# Patient Record
Sex: Male | Born: 1950
Health system: Southern US, Community
[De-identification: ages and names within clinical notes are randomized; demographics above are authoritative.]

## PROBLEM LIST (undated history)

## (undated) DIAGNOSIS — I10 Essential (primary) hypertension: Secondary | ICD-10-CM

## (undated) DIAGNOSIS — M19011 Primary osteoarthritis, right shoulder: Secondary | ICD-10-CM

## (undated) DIAGNOSIS — N401 Enlarged prostate with lower urinary tract symptoms: Secondary | ICD-10-CM

## (undated) DIAGNOSIS — T7840XA Allergy, unspecified, initial encounter: Secondary | ICD-10-CM

## (undated) DIAGNOSIS — B029 Zoster without complications: Secondary | ICD-10-CM

## (undated) DIAGNOSIS — M1990 Unspecified osteoarthritis, unspecified site: Secondary | ICD-10-CM

## (undated) DIAGNOSIS — H269 Unspecified cataract: Secondary | ICD-10-CM

## (undated) HISTORY — PX: EYE SURGERY: SHX253

## (undated) HISTORY — DX: Zoster without complications: B02.9

## (undated) HISTORY — PX: JOINT REPLACEMENT: SHX530

## (undated) HISTORY — DX: Allergy, unspecified, initial encounter: T78.40XA

## (undated) HISTORY — PX: VASECTOMY: SHX75

## (undated) HISTORY — DX: Essential (primary) hypertension: I10

## (undated) HISTORY — DX: Unspecified cataract: H26.9

---
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
## 2008-03-28 HISTORY — PX: ROTATOR CUFF REPAIR: SHX139

## 2010-03-28 HISTORY — PX: RETINAL DETACHMENT SURGERY: SHX105

## 2011-03-29 HISTORY — PX: CATARACT EXTRACTION: SUR2

## 2012-02-29 LAB — LIPID PANEL
Cholesterol: 160 mg/dL (ref 0–200)
HDL: 62 mg/dL (ref 35–70)
LDL CALC: 82 mg/dL
Triglycerides: 79 mg/dL (ref 40–160)

## 2012-02-29 LAB — PSA: PSA: 1.5

## 2012-02-29 LAB — HEPATIC FUNCTION PANEL
ALK PHOS: 65 U/L (ref 25–125)
ALT: 52 U/L — AB (ref 10–40)
AST: 42 U/L — AB (ref 14–40)

## 2012-02-29 LAB — BASIC METABOLIC PANEL
Creatinine: 0.9 mg/dL (ref 0.6–1.3)
GLUCOSE: 95 mg/dL
Potassium: 4.6 mmol/L (ref 3.4–5.3)
SODIUM: 141 mmol/L (ref 137–147)

## 2012-02-29 LAB — C-REACTIVE PROTEIN: CRP: 1 mg/dL

## 2012-02-29 LAB — ANA: ANA Direct: NEGATIVE

## 2012-02-29 LAB — TSH: TSH: 1.14 u[IU]/mL (ref 0.41–5.90)

## 2012-02-29 LAB — CYCLIC CITRUL PEPTIDE ANTIBODY, IGG: CCP Antibodies IgG/IgA: NEGATIVE

## 2012-02-29 LAB — RHEUMATOID FACTOR: Rheumatoid Factor Ab (IgM): <7

## 2015-01-05 ENCOUNTER — Ambulatory Visit (INDEPENDENT_AMBULATORY_CARE_PROVIDER_SITE_OTHER): Payer: Medicare Other | Admitting: Osteopathic Medicine

## 2015-01-05 ENCOUNTER — Encounter: Payer: Self-pay | Admitting: Osteopathic Medicine

## 2015-01-05 VITALS — BP 150/85 | HR 63 | Ht 70.0 in | Wt 180.0 lb

## 2015-01-05 DIAGNOSIS — I10 Essential (primary) hypertension: Secondary | ICD-10-CM

## 2015-01-05 DIAGNOSIS — Z139 Encounter for screening, unspecified: Secondary | ICD-10-CM

## 2015-01-05 DIAGNOSIS — R7301 Impaired fasting glucose: Secondary | ICD-10-CM

## 2015-01-05 DIAGNOSIS — J019 Acute sinusitis, unspecified: Secondary | ICD-10-CM | POA: Diagnosis not present

## 2015-01-05 DIAGNOSIS — B9689 Other specified bacterial agents as the cause of diseases classified elsewhere: Secondary | ICD-10-CM

## 2015-01-05 DIAGNOSIS — R030 Elevated blood-pressure reading, without diagnosis of hypertension: Secondary | ICD-10-CM | POA: Insufficient documentation

## 2015-01-05 HISTORY — DX: Essential (primary) hypertension: I10

## 2015-01-05 LAB — CBC WITH DIFFERENTIAL/PLATELET
BASOS PCT: 0 % (ref 0–1)
Basophils Absolute: 0 10*3/uL (ref 0.0–0.1)
Eosinophils Absolute: 0.1 10*3/uL (ref 0.0–0.7)
Eosinophils Relative: 1 % (ref 0–5)
HEMATOCRIT: 40.6 % (ref 39.0–52.0)
HEMOGLOBIN: 14 g/dL (ref 13.0–17.0)
LYMPHS PCT: 24 % (ref 12–46)
Lymphs Abs: 1.5 10*3/uL (ref 0.7–4.0)
MCH: 35.5 pg — AB (ref 26.0–34.0)
MCHC: 34.5 g/dL (ref 30.0–36.0)
MCV: 103 fL — ABNORMAL HIGH (ref 78.0–100.0)
MONO ABS: 0.6 10*3/uL (ref 0.1–1.0)
MONOS PCT: 9 % (ref 3–12)
MPV: 11.8 fL (ref 8.6–12.4)
NEUTROS ABS: 4.2 10*3/uL (ref 1.7–7.7)
NEUTROS PCT: 66 % (ref 43–77)
Platelets: 199 10*3/uL (ref 150–400)
RBC: 3.94 MIL/uL — ABNORMAL LOW (ref 4.22–5.81)
RDW: 13.5 % (ref 11.5–15.5)
WBC: 6.4 10*3/uL (ref 4.0–10.5)

## 2015-01-05 MED ORDER — AMOXICILLIN-POT CLAVULANATE 875-125 MG PO TABS: 1.0000 | ORAL_TABLET | Freq: Two times a day (BID) | ORAL | Status: DC

## 2015-01-05 NOTE — Patient Instructions (Signed)
Bring your blood pressure cuff into the office - nurse visit to make sure your cuff is accurate.

## 2015-01-05 NOTE — Progress Notes (Signed)
HPI: Billy Landry is a 64 y.o. male who presents to [facility]  today for chief complaint of:  Chief Complaint  Patient presents with  . Establish Care  . Hypertension  . sinus drainage    x10 days   Hypertension . Severity: moderate . Duration: Diagnosed approximately 2 weeks ago . Context: Blood pressure greater than 200/100 diagnosed on vital signs check at urgent care . Assoc signs/symptoms: No chest pain, pressure, palpitations, headache, vision change.  Sinus drainage . Location: Sinuses . Quality: Soreness, pressure, drainage . Severity: Mild to moderate . Duration: 10 days . Modifying factors: Tried DayQuil and neck with minimal relief . Assoc signs/symptoms: No fever, chills, cough     Past medical, social and family history reviewed: Past Medical History  Diagnosis Date  . Hypertension   . Allergy    Past Surgical History  Procedure Laterality Date  . Vasectomy    . Retinal detachment surgery  2012  . Tonsilectomy, adenoidectomy, bilateral myringotomy and tubes  1968  . Rotator cuff repair Right 2010  . Cataract extraction Left 2013   Social History  Substance Use Topics  . Smoking status: Never Smoker   . Smokeless tobacco: Not on file  . Alcohol Use: Not on file   Family History  Problem Relation Age of Onset  . Cancer Mother   . Diabetes Father   . Stroke Father   . Stroke Brother     Current Outpatient Prescriptions  Medication Sig Dispense Refill  . amoxicillin-clavulanate (AUGMENTIN) 875-125 MG tablet Take 1 tablet by mouth 2 (two) times daily. 10 tablet 0  . Cholecalciferol (VITAMIN D PO) Take 4,000 Int'l Units by mouth daily.    . cyanocobalamin 2000 MCG tablet Take 2,000 mcg by mouth daily.    Marland Kitchen L-Lysine 1000 MG TABS Take by mouth.    . loratadine (CLARITIN) 10 MG tablet Take 10 mg by mouth daily.    Marland Kitchen losartan (COZAAR) 25 MG tablet Take 25 mg by mouth 2 (two) times daily.    . vitamin E 400 UNIT  capsule Take 400 Units by mouth daily.     No current facility-administered medications for this visit.   Allergies  Allergen Reactions  . Bee Venom Anaphylaxis     Review of Systems: CONSTITUTIONAL: Neg fever/chills, no unintentional weight changes HEAD/EYES/EARS/NOSE/THROAT: No headache/vision change or hearing change, no sore throat, positive sinus pressure and drainage as per history of present illness CARDIAC: No chest pain/pressure/palpitations, no orthopnea RESPIRATORY: No cough/shortness of breath/wheeze GASTROINTESTINAL: No nausea/vomiting/abdominal pain/blood in stool/diarrhea/constipation MUSCULOSKELETAL: No myalgia/arthralgia GENITOURINARY: No incontinence, No abnormal genital bleeding/discharge SKIN: No rash/wounds/concerning lesions HEM/ONC: No easy bruising/bleeding, no abnormal lymph node ENDOCRINE: No polyuria/polydipsia/polyphagia, no heat/cold intolerance  NEUROLOGIC: No weakness/dizzines/slurred speech PSYCHIATRIC: No concerns with depression/anxiety or sleep problems    Exam:  BP 150/85 mmHg  Pulse 63  Ht 5\' 10"  (1.778 m)  Wt 180 lb (81.647 kg)  BMI 25.83 kg/m2 Constitutional: VSS, see above. General Appearance: alert, well-developed, well-nourished, NAD Eyes: Normal lids and conjunctive, non-icteric sclera, PERRLA Ears, Nose, Mouth, Throat: Normal external inspection ears/nares/mouth/lips/gums, Normal TM bilaterally, MMM, posterior pharynx without erythema/exudate Neck: No masses, trachea midline. No thyroid enlargement/tenderness/mass appreciated Respiratory: Normal respiratory effort. no wheeze/rhonchi/rales Cardiovascular: S1/S2 normal, no murmur/rub/gallop auscultated. RRR. No carotid bruit or JVD. No abdominal aortic bruit. Pedal pulse II/IV bilaterally DP and PT. No lower extremity edema. Gastrointestinal: Nontender, no masses. No hepatomegaly, no splenomegaly. No hernia appreciated. Bowel sounds normal. Rectal  exam deferred.  Musculoskeletal: Gait  normal. No clubbing/cyanosis of digits.  Neurological: No cranial nerve deficit on limited exam. Motor and sensation intact and symmetric Psychiatric: Normal judgment/insight. Normal mood and affect. Oriented x3.    No results found for this or any previous visit (from the past 72 hour(s)).    ASSESSMENT/PLAN:  Essential hypertension - Plan: CBC with Differential/Platelet, COMPLETE METABOLIC PANEL WITH GFR, Lipid panel, TSH. Lipid screening and metabolic profile as above, patient advised on annual screening for lipid check and diabetes in any hypertensive patient. Needs follow-up in one week for nurse visit to check his blood pressure cuff against our equipment so we can trust his home measurements, which are occasionally 120s over 80s but also are in 150s over 90s on occasion, patient counseled on proper use of home blood pressure cuff including sitting for 5 minutes, both feet on fluoroscopy, arm resting heart level.. May need to consider increasing losartan person at [facility], await lab work to ensure medication safety, patient advised only to repeat metabolic profile in a few months to ensure stable kidney function. May expect some yet transient increase in creatinine given he was recently started on losartan, if this is the case we'll repeat kidney function sooner.  Screening - Plan: Hepatitis C antibody, HIV antibody  Acute bacterial sinusitis - augmentin Rx given, RTC if no better, Flonase and antihistamin w/o Decongestant recommended, avoid NSAID but Tylenol ok.    Recommended follow-up for Medicare annual wellness visit, will update ministrations at that time, as well as other preventive care.

## 2015-01-06 DIAGNOSIS — R7301 Impaired fasting glucose: Secondary | ICD-10-CM | POA: Diagnosis not present

## 2015-01-06 LAB — COMPLETE METABOLIC PANEL WITH GFR
ALBUMIN: 4.2 g/dL (ref 3.6–5.1)
ALK PHOS: 55 U/L (ref 40–115)
ALT: 30 U/L (ref 9–46)
AST: 27 U/L (ref 10–35)
BILIRUBIN TOTAL: 0.8 mg/dL (ref 0.2–1.2)
BUN: 23 mg/dL (ref 7–25)
CALCIUM: 9.7 mg/dL (ref 8.6–10.3)
CO2: 26 mmol/L (ref 20–31)
Chloride: 105 mmol/L (ref 98–110)
Creat: 0.75 mg/dL (ref 0.70–1.25)
GFR, Est African American: >89 mL/min (ref >=60)
GLUCOSE: 107 mg/dL — AB (ref 65–99)
Potassium: 4.5 mmol/L (ref 3.5–5.3)
SODIUM: 140 mmol/L (ref 135–146)
TOTAL PROTEIN: 6.6 g/dL (ref 6.1–8.1)

## 2015-01-06 LAB — LIPID PANEL
CHOLESTEROL: 164 mg/dL (ref 125–200)
HDL: 43 mg/dL (ref >=40)
LDL Cholesterol: 103 mg/dL (ref <130)
Total CHOL/HDL Ratio: 3.8 Ratio (ref <=5.0)
Triglycerides: 89 mg/dL (ref <150)
VLDL: 18 mg/dL (ref <30)

## 2015-01-06 LAB — HEMOGLOBIN A1C
HEMOGLOBIN A1C: 5.5 % (ref <5.7)
MEAN PLASMA GLUCOSE: 111 mg/dL (ref <117)

## 2015-01-06 LAB — HIV ANTIBODY (ROUTINE TESTING W REFLEX): HIV: NONREACTIVE

## 2015-01-06 LAB — TSH: TSH: 1.745 u[IU]/mL (ref 0.350–4.500)

## 2015-01-06 LAB — HEPATITIS C ANTIBODY: HCV AB: NEGATIVE

## 2015-01-06 NOTE — Addendum Note (Signed)
Addended by: Maryla Morrow on: 01/06/2015 08:13 AM   Modules accepted: Orders

## 2015-01-08 ENCOUNTER — Telehealth: Payer: Self-pay | Admitting: *Deleted

## 2015-01-08 NOTE — Telephone Encounter (Signed)
Pt is requesting a refill on losartan. Confirmed dose and directions are same as what we have in the computer. Have to rout to provider since our office has not yet prescribed this medication

## 2015-01-09 ENCOUNTER — Other Ambulatory Visit: Payer: Self-pay | Admitting: *Deleted

## 2015-01-09 MED ORDER — LOSARTAN POTASSIUM 25 MG PO TABS: 25.0000 mg | ORAL_TABLET | Freq: Two times a day (BID) | ORAL | Status: DC

## 2015-01-12 ENCOUNTER — Ambulatory Visit (INDEPENDENT_AMBULATORY_CARE_PROVIDER_SITE_OTHER): Payer: Medicare Other | Admitting: Osteopathic Medicine

## 2015-01-12 VITALS — BP 123/71 | HR 60

## 2015-01-12 DIAGNOSIS — IMO0001 Reserved for inherently not codable concepts without codable children: Secondary | ICD-10-CM

## 2015-01-12 DIAGNOSIS — I10 Essential (primary) hypertension: Secondary | ICD-10-CM | POA: Diagnosis not present

## 2015-01-12 DIAGNOSIS — R030 Elevated blood-pressure reading, without diagnosis of hypertension: Secondary | ICD-10-CM | POA: Diagnosis not present

## 2015-01-12 NOTE — Progress Notes (Signed)
   Subjective:    Patient ID: Billy Landry, male    DOB: 1950-11-12, 64 y.o.   MRN: [id number]  HPI  Caswell is here for a blood pressure check. He reports taking Losartan 25 mg bid. Check blood pressure with home monitor 123/75 and then with clinic monitor 123/71. Home blood pressure monitor is reading almost the same as clinic. Denies chest pain, shortness of breath or headaches.   Review of Systems     Objective:   Physical Exam        Assessment & Plan:  Elevated blood pressure - needs a refill on Losartan.

## 2015-01-13 MED ORDER — LOSARTAN POTASSIUM 50 MG PO TABS: 50.0000 mg | ORAL_TABLET | Freq: Every day | ORAL | Status: DC

## 2015-01-22 ENCOUNTER — Other Ambulatory Visit: Payer: Self-pay | Admitting: Osteopathic Medicine

## 2015-01-29 ENCOUNTER — Encounter: Payer: Self-pay | Admitting: Osteopathic Medicine

## 2015-03-31 DIAGNOSIS — H26492 Other secondary cataract, left eye: Secondary | ICD-10-CM | POA: Diagnosis not present

## 2015-03-31 DIAGNOSIS — H2511 Age-related nuclear cataract, right eye: Secondary | ICD-10-CM | POA: Diagnosis not present

## 2015-05-19 DIAGNOSIS — H02834 Dermatochalasis of left upper eyelid: Secondary | ICD-10-CM | POA: Diagnosis not present

## 2015-05-19 DIAGNOSIS — H26493 Other secondary cataract, bilateral: Secondary | ICD-10-CM | POA: Diagnosis not present

## 2015-05-19 DIAGNOSIS — H527 Unspecified disorder of refraction: Secondary | ICD-10-CM | POA: Diagnosis not present

## 2015-05-19 DIAGNOSIS — H33012 Retinal detachment with single break, left eye: Secondary | ICD-10-CM | POA: Diagnosis not present

## 2015-05-19 DIAGNOSIS — H25811 Combined forms of age-related cataract, right eye: Secondary | ICD-10-CM | POA: Diagnosis not present

## 2015-05-19 DIAGNOSIS — H35372 Puckering of macula, left eye: Secondary | ICD-10-CM | POA: Diagnosis not present

## 2015-05-19 DIAGNOSIS — H02831 Dermatochalasis of right upper eyelid: Secondary | ICD-10-CM | POA: Diagnosis not present

## 2015-05-19 DIAGNOSIS — H31012 Macula scars of posterior pole (postinflammatory) (post-traumatic), left eye: Secondary | ICD-10-CM | POA: Diagnosis not present

## 2015-07-16 ENCOUNTER — Other Ambulatory Visit: Payer: Self-pay

## 2015-07-16 ENCOUNTER — Other Ambulatory Visit: Payer: Self-pay | Admitting: Osteopathic Medicine

## 2015-07-16 DIAGNOSIS — I10 Essential (primary) hypertension: Secondary | ICD-10-CM

## 2015-07-16 MED ORDER — LOSARTAN POTASSIUM 50 MG PO TABS: ORAL_TABLET | ORAL | Status: DC

## 2015-08-15 ENCOUNTER — Other Ambulatory Visit: Payer: Self-pay | Admitting: Osteopathic Medicine

## 2015-08-17 ENCOUNTER — Other Ambulatory Visit: Payer: Self-pay

## 2015-08-17 MED ORDER — LOSARTAN POTASSIUM 50 MG PO TABS: 50.0000 mg | ORAL_TABLET | Freq: Every day | ORAL | Status: DC

## 2015-08-17 MED ORDER — LOSARTAN POTASSIUM 25 MG PO TABS: 25.0000 mg | ORAL_TABLET | Freq: Two times a day (BID) | ORAL | Status: DC

## 2015-08-17 NOTE — Telephone Encounter (Signed)
Patient requets refill for Losartan 50 mg. #30 0 refills sent to pharmacy. Patient advised appointment is needed for further refills. Darrek Leasure,CMA

## 2015-09-16 ENCOUNTER — Other Ambulatory Visit: Payer: Self-pay | Admitting: Osteopathic Medicine

## 2015-10-06 DIAGNOSIS — H2511 Age-related nuclear cataract, right eye: Secondary | ICD-10-CM | POA: Diagnosis not present

## 2015-10-06 DIAGNOSIS — H02834 Dermatochalasis of left upper eyelid: Secondary | ICD-10-CM | POA: Diagnosis not present

## 2015-10-06 DIAGNOSIS — H02831 Dermatochalasis of right upper eyelid: Secondary | ICD-10-CM | POA: Diagnosis not present

## 2015-10-06 DIAGNOSIS — H35372 Puckering of macula, left eye: Secondary | ICD-10-CM | POA: Diagnosis not present

## 2015-10-17 ENCOUNTER — Other Ambulatory Visit: Payer: Self-pay | Admitting: Osteopathic Medicine

## 2015-10-26 ENCOUNTER — Ambulatory Visit (INDEPENDENT_AMBULATORY_CARE_PROVIDER_SITE_OTHER): Payer: Medicare Other | Admitting: Osteopathic Medicine

## 2015-10-26 ENCOUNTER — Encounter: Payer: Self-pay | Admitting: Osteopathic Medicine

## 2015-10-26 VITALS — BP 163/94 | HR 75 | Ht 70.0 in | Wt 182.0 lb

## 2015-10-26 DIAGNOSIS — Z85828 Personal history of other malignant neoplasm of skin: Secondary | ICD-10-CM

## 2015-10-26 DIAGNOSIS — R030 Elevated blood-pressure reading, without diagnosis of hypertension: Secondary | ICD-10-CM

## 2015-10-26 DIAGNOSIS — Z0000 Encounter for general adult medical examination without abnormal findings: Secondary | ICD-10-CM | POA: Diagnosis not present

## 2015-10-26 DIAGNOSIS — I10 Essential (primary) hypertension: Secondary | ICD-10-CM

## 2015-10-26 DIAGNOSIS — IMO0001 Reserved for inherently not codable concepts without codable children: Secondary | ICD-10-CM

## 2015-10-26 DIAGNOSIS — Z23 Encounter for immunization: Secondary | ICD-10-CM

## 2015-10-26 NOTE — Patient Instructions (Signed)
Please let us know if you don't hear back about dermatology referral by the end of this week.  Please plan on getting routine blood work done in October 2017 (sometime around/by Halloween).   If you have any questions about routine recommended vaccinations that we discussed today, flu vaccine, shingles vaccine, pneumonia vaccines, please let me know.   Anything else we can do for you, please don't hesitate to contact us!

## 2015-10-26 NOTE — Progress Notes (Signed)
HPI: Billy Landry is a 65 y.o. Not Hispanic or Latino male  who presents to [facility] today, 10/26/15,  for chief complaint of:  Chief Complaint  Patient presents with  . Annual Exam    MEDICATION REFILL     Preventive care reviewed as below, patient has no complaints today. He brings home blood pressure readings with him to the visit.  Hypertension: Patient was here in October 2016, blood pressure initially elevated to follow-up for nurse visit once started on losartan 25 mg 3 times a day showed some improvement. Home blood pressure monitor reading the same as clinic. He should brings home reading to visits today, blood pressure average systolic [id number], diastolic [id number].    Past medical, surgical, social and family history reviewed: Past Medical History:  Diagnosis Date  . Allergy   . Essential hypertension 01/05/2015  . Hypertension    Past Surgical History:  Procedure Laterality Date  . CATARACT EXTRACTION Left 2013  . RETINAL DETACHMENT SURGERY  2012  . ROTATOR CUFF REPAIR Right 2010  . TONSILECTOMY, ADENOIDECTOMY, BILATERAL MYRINGOTOMY AND TUBES  1968  . VASECTOMY     Social History  Substance Use Topics  . Smoking status: Never Smoker  . Smokeless tobacco: Not on file  . Alcohol use 0.0 oz/week   Family History  Problem Relation Age of Onset  . Cancer Mother   . Diabetes Father   . Stroke Father   . Stroke Brother      Current medication list and allergy/intolerance information reviewed:   Current Outpatient Prescriptions  Medication Sig Dispense Refill  . aspirin EC 81 MG tablet Take 81 mg by mouth daily.    . Cholecalciferol (VITAMIN D PO) Take 4,000 Int'l Units by mouth daily.    . cyanocobalamin 2000 MCG tablet Take 2,000 mcg by mouth daily.    Marland Kitchen L-Lysine 1000 MG TABS Take by mouth.    . loratadine (CLARITIN) 10 MG tablet Take 10 mg by mouth daily.    Marland Kitchen losartan (COZAAR) 25 MG tablet Take 1 tablet (25 mg total) by mouth  2 (two) times daily. 30 tablet 0  . losartan (COZAAR) 50 MG tablet TAKE 1 TABLET BY MOUTH EVERY DAY, APPT NEEDED FOR FURTHER REFILLS 15 tablet 0  . Melatonin 5 MG TABS Take by mouth.    . vitamin E 400 UNIT capsule Take 400 Units by mouth daily.     No current facility-administered medications for this visit.    Allergies  Allergen Reactions  . Bee Venom Anaphylaxis      Review of Systems:  Constitutional:  No  fever, no chills, No recent illness, No unintentional weight changes. No significant fatigue.   HEENT: No  headache, no vision change, no hearing change, No sore throat, No  sinus pressure  Cardiac: No  chest pain, No  pressure, No palpitations, No  Orthopnea  Respiratory:  No  shortness of breath. No  Cough  Gastrointestinal: No  abdominal pain, No  nausea, No  vomiting,  No  blood in stool, No  diarrhea, No  constipation   Musculoskeletal: No new myalgia/arthralgia  Genitourinary: No  incontinence, No  abnormal genital bleeding, No abnormal genital discharge  Skin: No  Rash, No other wounds/concerning lesions  Hem/Onc: No  easy bruising/bleeding, No  abnormal lymph node  Endocrine: No cold intolerance,  No heat intolerance. No polyuria/polydipsia/polyphagia   Neurologic: No  weakness, No  dizziness, No  slurred speech/focal weakness/facial droop  Psychiatric:  No  concerns with depression, No  concerns with anxiety, No sleep problems, No mood problems  Exam:  BP (!) 163/94   Pulse 75   Ht 5\' 10"  (1.778 m)   Wt 182 lb (82.6 kg)   BMI 26.11 kg/m   Constitutional: VS see above. General Appearance: alert, well-developed, well-nourished, NAD  Eyes: Normal lids and conjunctive, non-icteric sclera  Ears, Nose, Mouth, Throat: MMM, Normal external inspection ears/nares/mouth/lips/gums. TM normal bilaterally. Pharynx/tonsils no erythema, no exudate. Nasal mucosa normal.   Neck: No masses, trachea midline. No thyroid enlargement. No tenderness/mass appreciated. No  lymphadenopathy  Respiratory: Normal respiratory effort. no wheeze, no rhonchi, no rales  Cardiovascular: S1/S2 normal, no murmur, no rub/gallop auscultated. RRR. No lower extremity edema. Pedal pulse II/IV bilaterally DP and PT. No carotid bruit or JVD. No abdominal aortic bruit.  Gastrointestinal: Nontender, no masses. No hepatomegaly, no splenomegaly. No hernia appreciated. Bowel sounds normal. Rectal exam deferred.   Musculoskeletal: Gait normal. No clubbing/cyanosis of digits.   Neurological: No cranial nerve deficit on limited exam. Motor and sensation intact and symmetric. Cerebellar reflexes intact. Normal balance/coordination. No tremor.   Skin: warm, dry, intact. No rash/ulcer. No concerning nevi or subq nodules on limited exam. (+) ?actinic keratosis on R arm , sun damage diffuse   Psychiatric: Normal judgment/insight. Normal mood and affect. Oriented x3.      ASSESSMENT/PLAN:   Annual physical exam  Need for diphtheria-tetanus-pertussis (Tdap) vaccine, adult/adolescent - Plan: Tdap vaccine greater than or equal to 7yo IM  Essential hypertension - Plan: COMPLETE METABOLIC PANEL WITH GFR, CBC with Differential/Platelet, Lipid panel  History of skin cancer - Plan: Ambulatory referral to Dermatology  White coat hypertension    MALE PREVENTIVE CARE updated 10/26/15  ANNUAL SCREENING/COUNSELING  Any changes to health in the past year? No  Other changes - working from home, not really liking   Tobacco - no - Never   Alcohol - weekends, and occasional wine during the week  Diet/Exercise - Healthy habits discussed to decrease CV risk and promote overall health. Patient does not have dietary restrictions. High protein and staying away from carbs. Not formal exercise.   Depression - PQH2 Negative  Feel safe at home? - yes  HTN SCREENING - SEE [place]  Sexually active? - Yes with male.  STI testing needed/desired today? - no  Any  concerns with testosterone/libido? - no  INFECTIOUS DISEASE SCREENING  HIV - does not need  GC/CT - does not need  HepC - does not need  TB - does not need  CANCER SCREENING  Lung - does not need  Colon - does not need - normal scope 2011 per patient  Prostate - does not need   OTHER DISEASE SCREENING  Lipid - does not need  DM2 - does not need  Osteoporosis - does not need  ADULT VACCINATION  Influenza - does not need today, annual vaccine recommended.   Td - was given  Zoster - was offered and declined by the patient  Pneumonia - was offered and declined by the patient       Visit summary with medication list and pertinent instructions was printed for patient to review. All questions at time of visit were answered - patient instructed to contact office with any additional concerns. ER/RTC precautions were reviewed with the patient. Follow-up plan: No Follow-up on file.

## 2015-11-04 ENCOUNTER — Other Ambulatory Visit: Payer: Self-pay | Admitting: Osteopathic Medicine

## 2015-11-19 ENCOUNTER — Other Ambulatory Visit: Payer: Self-pay | Admitting: Osteopathic Medicine

## 2015-12-11 ENCOUNTER — Emergency Department (INDEPENDENT_AMBULATORY_CARE_PROVIDER_SITE_OTHER)
Admission: EM | Admit: 2015-12-11 | Discharge: 2015-12-11 | Disposition: A | Discharge status: Home / Self Care | Payer: Medicare Other | Source: Home / Self Care | Attending: Emergency Medicine | Admitting: Emergency Medicine

## 2015-12-11 ENCOUNTER — Encounter: Payer: Self-pay | Admitting: *Deleted

## 2015-12-11 DIAGNOSIS — L03115 Cellulitis of right lower limb: Secondary | ICD-10-CM

## 2015-12-11 MED ORDER — DOXYCYCLINE HYCLATE 100 MG PO CAPS: 100.0000 mg | ORAL_CAPSULE | Freq: Two times a day (BID) | ORAL | 0 refills | Status: DC

## 2015-12-11 MED ORDER — CEFTRIAXONE SODIUM 1 G IJ SOLR
1.0000 g | INTRAMUSCULAR | Status: AC
  Administered 2015-12-11: 1 g via INTRAMUSCULAR

## 2015-12-11 NOTE — ED Provider Notes (Signed)
[facility] Land[facility]    CSN: [id number] Arrival date & time: 12/11/15  1424  First Provider Contact:  First MD Initiated Contact with Patient 12/11/15 1529        History   Chief Complaint Chief Complaint  Patient presents with  . Cellulitis    HPI Billy Landry is a 65 y.o. male.   The history is provided by the patient. No language interpreter was used.  about one week ago, had a new tattoo placed right lateral leg while he was at [place]. In the shape of the letter M, surrounded by an out line of the shape of the state of [place]. This was doing well without problems until yesterday, he went to a local tattoo artist to have some of that tattoo filled in. For the past day he has noted swelling, redness, soreness without drainage or bleeding. Pain is a soreness,mild to moderate without radiation. Denies fever or chills or nausea or vomiting. It hurts to touch or move his leg. No numbness or focal weakness. No chest pain or shortness of breath. For the past few hours, noted progressively worsening swelling and redness and soreness at the area. Of right lateral leg Past Medical History:  Diagnosis Date  . Allergy   . Essential hypertension 01/05/2015  . Hypertension     Patient Active Problem List   Diagnosis Date Noted  . History of skin cancer 10/26/2015  . Essential hypertension 01/05/2015    Past Surgical History:  Procedure Laterality Date  . CATARACT EXTRACTION Left 2013  . RETINAL DETACHMENT SURGERY  2012  . ROTATOR CUFF REPAIR Right 2010  . TONSILECTOMY, ADENOIDECTOMY, BILATERAL MYRINGOTOMY AND TUBES  1968  . VASECTOMY         Home Medications    Prior to Admission medications   Medication Sig Start Date End Date Taking? Authorizing Provider  aspirin EC 81 MG tablet Take 81 mg by mouth daily.    Historical Provider, MD  Cholecalciferol (VITAMIN D PO) Take 4,000 Int'l Units by mouth daily.    Historical Provider, MD  cyanocobalamin 2000 MCG tablet  Take 2,000 mcg by mouth daily.    Historical Provider, MD  doxycycline (VIBRAMYCIN) 100 MG capsule Take 1 capsule (100 mg total) by mouth 2 (two) times daily. For 10 days 12/11/15   Jacqulyn Cane, MD  L-Lysine 1000 MG TABS Take by mouth.    Historical Provider, MD  loratadine (CLARITIN) 10 MG tablet Take 10 mg by mouth daily.    Historical Provider, MD  losartan (COZAAR) 50 MG tablet Take 1 tablet (50 mg total) by mouth daily. 11/19/15   Emeterio Reeve, DO  Melatonin 5 MG TABS Take by mouth.    Historical Provider, MD  vitamin E 400 UNIT capsule Take 400 Units by mouth daily.    Historical Provider, MD    Family History Family History  Problem Relation Age of Onset  . Cancer Mother   . Diabetes Father   . Stroke Father   . Stroke Brother     Social History Social History  Substance Use Topics  . Smoking status: Never Smoker  . Smokeless tobacco: Never Used  . Alcohol use 0.0 oz/week     Allergies   Bee venom   Review of Systems Review of Systems  All other systems reviewed and are negative.    Physical Exam Triage Vital Signs ED Triage Vitals  Enc Vitals Group     BP 12/11/15 1534 (!) 168/103  Pulse Rate 12/11/15 1534 63     Resp --      Temp 12/11/15 1534 98.1 F (36.7 C)     Temp Source 12/11/15 1534 Oral     SpO2 12/11/15 1534 99 %     Weight 12/11/15 1534 178 lb (80.7 kg)     Height 12/11/15 1534 5\' 10"  (1.778 m)     Head Circumference --      Peak Flow --      Pain Score 12/11/15 1535 0     Pain Loc --      Pain Edu? --      Excl. in [place]? --    No data found.   Updated Vital Signs BP (!) 168/103 (BP Location: Left Arm)   Pulse 63   Temp 98.1 F (36.7 C) (Oral)   Ht 5\' 10"  (1.778 m)   Wt 178 lb (80.7 kg)   SpO2 99%   BMI 25.54 kg/m   Visual Acuity Right Eye Distance:   Left Eye Distance:   Bilateral Distance:    Right Eye Near:   Left Eye Near:    Bilateral Near:     Physical Exam  Constitutional: He is oriented to person, place,  and time. He appears well-developed and well-nourished. No distress.  HENT:  Head: Normocephalic and atraumatic.  Eyes: Pupils are equal, round, and reactive to light. No scleral icterus.  Neck: Normal range of motion. Neck supple.  Cardiovascular: Normal rate and regular rhythm.   Pulmonary/Chest: Effort normal.  Abdominal: He exhibits no distension.  Musculoskeletal:  4 x 4 centimeter area of redness, swelling, tenderness and warmth around the tattoo of [place], right lateral lower leg. No calf tenderness or cords. Homans sign negative. No red streaks. Neurovascular distally intact  Neurological: He is alert and oriented to person, place, and time.  Skin: Skin is warm and dry.  Psychiatric: He has a normal mood and affect. His behavior is normal.   No bony tenderness or any hot swollen joints.  UC Treatments / Results  Labs (all labs ordered are listed, but only abnormal results are displayed) Labs Reviewed - No data to display  EKG  EKG Interpretation None       Radiology No results found.  Procedures Procedures (including critical care time)  Medications Ordered in UC Medications  cefTRIAXone (ROCEPHIN) injection 1 g (1 g Intramuscular Given 12/11/15 1557)     Initial Impression / Assessment and Plan / UC Course  I have reviewed the triage vital signs and the nursing notes.  Pertinent labs & imaging results that were available during my care of the patient were reviewed by me and considered in my medical decision making (see chart for details).  Clinical Course     Final Clinical Impressions(s) / UC Diagnoses   Final diagnoses:  Cellulitis of right lower leg  he has progressive local symptoms, but no signs of systemic symptoms or fever. I'm concerned about the progression of the cellulitis the past day, and I feel that initial dose of parenteral antibiotic is indicated.  Treatment options discussed, as well as risks, benefits, alternatives. Patient voiced  understanding and agreement with the following plans: Rocephin 1 g IM stat Doxycycline 100 twice a day for 10 days Other nonpharmacologic measures discussed. Keep elevated. Follow-up with PCP in 3 days, or go to emergency room if any red flags. Also advised to have BP rechecked by PCP. An After Visit Summary was printed and given to the patient. Precautions  discussed. Red flags discussed. Questions invited and answered. He voiced understanding and agreement.   New Prescriptions Discharge Medication List as of 12/11/2015  5:21 PM    START taking these medications   Details  doxycycline (VIBRAMYCIN) 100 MG capsule Take 1 capsule (100 mg total) by mouth 2 (two) times daily. For 10 days, Starting Fri 12/11/2015, Print         Jacqulyn Cane, MD 12/11/15 1737

## 2015-12-11 NOTE — ED Triage Notes (Signed)
Pt reports getting a tattoo filled in 1 week ago. Since it has been weeping. Later became red, swollen and tender to touch. He is otherwise asymptomatic.

## 2015-12-11 NOTE — Discharge Instructions (Signed)
Today we gave you a shot of Rocephin as first antibiotic treatment for skin infection of leg. Take the doxycycline antibiotic by mouth as prescribed. If any severe or worsening symptoms, go to emergency room immediately. Follow-up with your doctor in 3 days for recheck.

## 2015-12-14 ENCOUNTER — Encounter: Payer: Self-pay | Admitting: Osteopathic Medicine

## 2015-12-14 ENCOUNTER — Ambulatory Visit (INDEPENDENT_AMBULATORY_CARE_PROVIDER_SITE_OTHER): Payer: Medicare Other | Admitting: Osteopathic Medicine

## 2015-12-14 VITALS — BP 118/88 | HR 62 | Ht 70.0 in | Wt 178.0 lb

## 2015-12-14 DIAGNOSIS — L03115 Cellulitis of right lower limb: Secondary | ICD-10-CM

## 2015-12-14 DIAGNOSIS — T7840XA Allergy, unspecified, initial encounter: Secondary | ICD-10-CM | POA: Diagnosis not present

## 2015-12-14 DIAGNOSIS — I10 Essential (primary) hypertension: Secondary | ICD-10-CM | POA: Diagnosis not present

## 2015-12-14 MED ORDER — METHYLPREDNISOLONE 4 MG PO TBPK: ORAL_TABLET | ORAL | 0 refills | Status: DC

## 2015-12-14 MED ORDER — CEPHALEXIN 500 MG PO CAPS: 500.0000 mg | ORAL_CAPSULE | Freq: Four times a day (QID) | ORAL | 0 refills | Status: DC

## 2015-12-14 NOTE — Progress Notes (Signed)
HPI: Billy Landry is a 65 y.o. male  who presents to [facility] today, 12/14/15,  for chief complaint of:  Chief Complaint  Patient presents with  . Follow-up    Cellulitis right leg   Leg skin problem: . Context: Recently seen and treated for cellulitis in urgent care, see records reviewed below. . Location:  RLE in area tattoo, only where yellow pigment was, see photograph below . Quality: Burning pain . Timing: Burning and redness started a few days after he had the tattoo performed. He had a yellow pigment added to the M within the outline of the state of [place], which was already done earlier and he had no problems with the dark ink. Tattoist was at [facility] in [place]   BP was also elevated at urgent care, pt here to follow on that. Taking medications regularly. No CP/SOB  Past medical, surgical, social and family history reviewed: Past Medical History:  Diagnosis Date  . Allergy   . Essential hypertension 01/05/2015  . Hypertension    Past Surgical History:  Procedure Laterality Date  . CATARACT EXTRACTION Left 2013  . RETINAL DETACHMENT SURGERY  2012  . ROTATOR CUFF REPAIR Right 2010  . TONSILECTOMY, ADENOIDECTOMY, BILATERAL MYRINGOTOMY AND TUBES  1968  . VASECTOMY     Social History  Substance Use Topics  . Smoking status: Never Smoker  . Smokeless tobacco: Never Used  . Alcohol use 0.0 oz/week   Family History  Problem Relation Age of Onset  . Cancer Mother   . Diabetes Father   . Stroke Father   . Stroke Brother      Current medication list and allergy/intolerance information reviewed:   Current Outpatient Prescriptions  Medication Sig Dispense Refill  . aspirin EC 81 MG tablet Take 81 mg by mouth daily.    . Cholecalciferol (VITAMIN D PO) Take 4,000 Int'l Units by mouth daily.    . cyanocobalamin 2000 MCG tablet Take 2,000 mcg by mouth daily.    Marland Kitchen doxycycline (VIBRAMYCIN) 100 MG capsule Take 1 capsule  (100 mg total) by mouth 2 (two) times daily. For 10 days 20 capsule 0  . L-Lysine 1000 MG TABS Take by mouth.    . loratadine (CLARITIN) 10 MG tablet Take 10 mg by mouth daily.    Marland Kitchen losartan (COZAAR) 50 MG tablet Take 1 tablet (50 mg total) by mouth daily. 30 tablet 3  . Melatonin 5 MG TABS Take by mouth.    . vitamin E 400 UNIT capsule Take 400 Units by mouth daily.     No current facility-administered medications for this visit.    Allergies  Allergen Reactions  . Bee Venom Anaphylaxis      Review of Systems:  Constitutional:  No  fever, no chills, No recent illness,.   HEENT: No  headache,  Cardiac: No  chest pain  Respiratory:  No  shortness of breath  Gastrointestinal: No  abdominal pain, No  nausea  Musculoskeletal: No new myalgia/arthralgia  Skin: +Rash, No other wounds/concerning lesions  Hem/Onc:No  abnormal lymph node  Exam:  BP 118/88   Pulse 62   Ht 5\' 10"  (1.778 m)   Wt 178 lb (80.7 kg)   BMI 25.54 kg/m   Constitutional: VS see above. General Appearance: alert, well-developed, well-nourished, NAD  Respiratory: Normal respiratory effort.   Cardiovascular: No lower extremity edema.   Musculoskeletal: Gait normal.   Skin:  (+) erythema and urticaria/hives surrounding M portion of  the tattoo, irritation where the yellow ink is placed, No concerning nevi or subq nodules on limited exam.    Psychiatric: Normal judgment/insight. Normal mood and affect. Oriented x3.        Urgent Care notes reviewed from 12/11/15: On exam: "4 x 4 centimeter area of redness, swelling, tenderness and warmth around the tattoo of [place], right lateral lower leg. No calf tenderness or cords. Homans sign negative. No red streaks. Neurovascular distally intact" Plan: "he has progressive local symptoms, but no signs of systemic symptoms or fever. I'm concerned about the progression of the cellulitis the past day, and I feel that initial dose of parenteral antibiotic is  indicated. Treatment options discussed, as well as risks, benefits, alternatives. Patient voiced understanding and agreement with the following plans: Rocephin 1 g IM stat Doxycycline 100 twice a day for 10 days Other nonpharmacologic measures discussed. Keep elevated. Follow-up with PCP in 3 days, or go to emergency room if any red flags. Also advised to have BP rechecked by PCP."     ASSESSMENT/PLAN: More suspicious of allergy to yellow ink than cellulitis but since doxy not helpful will cover with keflex instead and trial treatment for urticaria  Cellulitis of right lower leg - Plan: cephALEXin (KEFLEX) 500 MG capsule  Allergy, initial encounter - Plan: methylPREDNISolone (MEDROL DOSEPAK) 4 MG TBPK tablet    Patient Instructions  Change antibiotic, Keflex should cover a bit better for strep infections since Doxycycline was not helpful. I still suspect this is an allergy to the yellow pigment, so will add steroid taper as well, and you can try Benadryl. Ok to use Vaseline or topical antibiotic such as neosporin. Call me if the steroids don't help!   Disappointed that you did not include the U.P. However I would caution against future tattoos, though if you do decide to get more ink, definitely let any tattoo artists you go to in the future know about this reaction!    Visit summary with medication list and pertinent instructions was printed for patient to review. All questions at time of visit were answered - patient instructed to contact office with any additional concerns. ER/RTC precautions were reviewed with the patient. Follow-up plan: Return if symptoms worsen or fail to improve.

## 2015-12-14 NOTE — Patient Instructions (Addendum)
Change antibiotic, Keflex should cover a bit better for strep infections since Doxycycline was not helpful. I still suspect this is an allergy to the yellow pigment, so will add steroid taper as well, and you can try Benadryl. Ok to use Vaseline or topical antibiotic such as neosporin. Call me if the steroids don't help!   Disappointed that you did not include the U.P. However I would caution against future tattoos, though if you do decide to get more ink, definitely let any tattoo artists you go to in the future know about this reaction!

## 2015-12-15 ENCOUNTER — Telehealth: Payer: Self-pay | Admitting: *Deleted

## 2015-12-15 NOTE — Telephone Encounter (Signed)
Callback: Patient reports leg was not much improved. He saw PCP yesterday who changed antibiotic and added a steroid thinking he was having an allergic reaction to the ink.

## 2015-12-28 DIAGNOSIS — T7849XA Other allergy, initial encounter: Secondary | ICD-10-CM | POA: Diagnosis not present

## 2015-12-28 DIAGNOSIS — Z85828 Personal history of other malignant neoplasm of skin: Secondary | ICD-10-CM | POA: Diagnosis not present

## 2015-12-28 DIAGNOSIS — T814XXA Infection following a procedure, initial encounter: Secondary | ICD-10-CM | POA: Diagnosis not present

## 2015-12-28 DIAGNOSIS — L918 Other hypertrophic disorders of the skin: Secondary | ICD-10-CM | POA: Diagnosis not present

## 2015-12-28 DIAGNOSIS — D229 Melanocytic nevi, unspecified: Secondary | ICD-10-CM | POA: Diagnosis not present

## 2015-12-28 DIAGNOSIS — L821 Other seborrheic keratosis: Secondary | ICD-10-CM | POA: Diagnosis not present

## 2015-12-28 DIAGNOSIS — L089 Local infection of the skin and subcutaneous tissue, unspecified: Secondary | ICD-10-CM | POA: Diagnosis not present

## 2015-12-28 DIAGNOSIS — L923 Foreign body granuloma of the skin and subcutaneous tissue: Secondary | ICD-10-CM | POA: Diagnosis not present

## 2015-12-28 DIAGNOSIS — T148XXA Other injury of unspecified body region, initial encounter: Secondary | ICD-10-CM | POA: Diagnosis not present

## 2016-01-25 ENCOUNTER — Other Ambulatory Visit: Payer: Self-pay

## 2016-01-25 DIAGNOSIS — I10 Essential (primary) hypertension: Secondary | ICD-10-CM

## 2016-01-25 DIAGNOSIS — D7589 Other specified diseases of blood and blood-forming organs: Secondary | ICD-10-CM

## 2016-01-26 DIAGNOSIS — I10 Essential (primary) hypertension: Secondary | ICD-10-CM | POA: Diagnosis not present

## 2016-01-26 LAB — CBC WITH DIFFERENTIAL/PLATELET
BASOS ABS: 55 {cells}/uL (ref 0–200)
Basophils Relative: 1 %
Eosinophils Absolute: 165 cells/uL (ref 15–500)
Eosinophils Relative: 3 %
HEMATOCRIT: 39.6 % (ref 38.5–50.0)
HEMOGLOBIN: 13.5 g/dL (ref 13.2–17.1)
LYMPHS ABS: 1815 {cells}/uL (ref 850–3900)
Lymphocytes Relative: 33 %
MCH: 35.5 pg — ABNORMAL HIGH (ref 27.0–33.0)
MCHC: 34.1 g/dL (ref 32.0–36.0)
MCV: 104.2 fL — ABNORMAL HIGH (ref 80.0–100.0)
MONO ABS: 550 {cells}/uL (ref 200–950)
MPV: 12.8 fL — ABNORMAL HIGH (ref 7.5–12.5)
Monocytes Relative: 10 %
NEUTROS PCT: 53 %
Neutro Abs: 2915 cells/uL (ref 1500–7800)
Platelets: 158 10*3/uL (ref 140–400)
RBC: 3.8 MIL/uL — AB (ref 4.20–5.80)
RDW: 13.4 % (ref 11.0–15.0)
WBC: 5.5 10*3/uL (ref 3.8–10.8)

## 2016-01-26 LAB — COMPLETE METABOLIC PANEL WITH GFR
ALBUMIN: 4.3 g/dL (ref 3.6–5.1)
ALK PHOS: 51 U/L (ref 40–115)
ALT: 26 U/L (ref 9–46)
AST: 27 U/L (ref 10–35)
BUN: 16 mg/dL (ref 7–25)
CALCIUM: 9.7 mg/dL (ref 8.6–10.3)
CHLORIDE: 105 mmol/L (ref 98–110)
CO2: 26 mmol/L (ref 20–31)
Creat: 0.92 mg/dL (ref 0.70–1.25)
GFR, EST NON AFRICAN AMERICAN: 86 mL/min (ref >=60)
GFR, Est African American: >89 mL/min (ref >=60)
Glucose, Bld: 107 mg/dL — ABNORMAL HIGH (ref 65–99)
POTASSIUM: 4.3 mmol/L (ref 3.5–5.3)
SODIUM: 141 mmol/L (ref 135–146)
Total Bilirubin: 0.9 mg/dL (ref 0.2–1.2)
Total Protein: 6.6 g/dL (ref 6.1–8.1)

## 2016-01-26 LAB — LIPID PANEL
CHOL/HDL RATIO: 3.4 ratio (ref <=5.0)
Cholesterol: 186 mg/dL (ref 125–200)
HDL: 55 mg/dL (ref >=40)
LDL CALC: 110 mg/dL (ref <130)
TRIGLYCERIDES: 104 mg/dL (ref <150)
VLDL: 21 mg/dL (ref <30)

## 2016-01-27 ENCOUNTER — Other Ambulatory Visit: Payer: Self-pay

## 2016-01-27 DIAGNOSIS — D7589 Other specified diseases of blood and blood-forming organs: Secondary | ICD-10-CM

## 2016-01-28 LAB — FOLATE RBC: RBC FOLATE: 525 ng/mL (ref >280)

## 2016-02-11 NOTE — Progress Notes (Signed)
Called spoke with patient, persistent macrocytosis, I may have added labs on incorrectly, we need the B12 levels and a pathologist review. Patient okay to come back in for blood draw

## 2016-02-12 ENCOUNTER — Other Ambulatory Visit: Payer: Self-pay

## 2016-02-12 DIAGNOSIS — D7589 Other specified diseases of blood and blood-forming organs: Secondary | ICD-10-CM | POA: Diagnosis not present

## 2016-02-12 LAB — CBC WITH DIFFERENTIAL/PLATELET
BASOS ABS: 54 {cells}/uL (ref 0–200)
BASOS PCT: 1 %
EOS ABS: 162 {cells}/uL (ref 15–500)
Eosinophils Relative: 3 %
HEMATOCRIT: 42.5 % (ref 38.5–50.0)
HEMOGLOBIN: 14.3 g/dL (ref 13.2–17.1)
Lymphocytes Relative: 34 %
Lymphs Abs: 1836 cells/uL (ref 850–3900)
MCH: 35.6 pg — AB (ref 27.0–33.0)
MCHC: 33.6 g/dL (ref 32.0–36.0)
MCV: 105.7 fL — AB (ref 80.0–100.0)
MONO ABS: 594 {cells}/uL (ref 200–950)
MONOS PCT: 11 %
MPV: 12.4 fL (ref 7.5–12.5)
NEUTROS ABS: 2754 {cells}/uL (ref 1500–7800)
Neutrophils Relative %: 51 %
PLATELETS: 169 10*3/uL (ref 140–400)
RBC: 4.02 MIL/uL — ABNORMAL LOW (ref 4.20–5.80)
RDW: 13.6 % (ref 11.0–15.0)
WBC: 5.4 10*3/uL (ref 3.8–10.8)

## 2016-02-12 LAB — VITAMIN B12: Vitamin B-12: 975 pg/mL (ref 200–1100)

## 2016-02-15 LAB — PATHOLOGIST SMEAR REVIEW

## 2016-02-17 ENCOUNTER — Encounter: Payer: Self-pay | Admitting: Osteopathic Medicine

## 2016-02-17 DIAGNOSIS — D7589 Other specified diseases of blood and blood-forming organs: Secondary | ICD-10-CM | POA: Insufficient documentation

## 2016-02-17 DIAGNOSIS — L989 Disorder of the skin and subcutaneous tissue, unspecified: Secondary | ICD-10-CM | POA: Insufficient documentation

## 2016-03-20 ENCOUNTER — Other Ambulatory Visit: Payer: Self-pay | Admitting: Osteopathic Medicine

## 2016-03-29 ENCOUNTER — Other Ambulatory Visit: Payer: Self-pay

## 2016-03-29 MED ORDER — LOSARTAN POTASSIUM 50 MG PO TABS
50.0000 mg | ORAL_TABLET | Freq: Every day | ORAL | 0 refills | Status: DC
Start: 2016-03-29 — End: 2016-04-28

## 2016-03-29 NOTE — Telephone Encounter (Signed)
Patient request refill for Losartan. Rhonda Cunningham,CMA

## 2016-04-28 ENCOUNTER — Ambulatory Visit: Payer: Medicare Other

## 2016-04-28 ENCOUNTER — Ambulatory Visit (INDEPENDENT_AMBULATORY_CARE_PROVIDER_SITE_OTHER): Payer: Medicare Other | Admitting: Osteopathic Medicine

## 2016-04-28 ENCOUNTER — Encounter: Payer: Self-pay | Admitting: Osteopathic Medicine

## 2016-04-28 VITALS — BP 128/76 | HR 67 | Ht 70.0 in | Wt 179.0 lb

## 2016-04-28 DIAGNOSIS — D7589 Other specified diseases of blood and blood-forming organs: Secondary | ICD-10-CM

## 2016-04-28 DIAGNOSIS — I10 Essential (primary) hypertension: Secondary | ICD-10-CM | POA: Diagnosis not present

## 2016-04-28 DIAGNOSIS — B001 Herpesviral vesicular dermatitis: Secondary | ICD-10-CM | POA: Diagnosis not present

## 2016-04-28 MED ORDER — LOSARTAN POTASSIUM 50 MG PO TABS: 50.0000 mg | ORAL_TABLET | Freq: Every day | ORAL | 3 refills | Status: DC

## 2016-04-28 MED ORDER — VALACYCLOVIR HCL 1 G PO TABS: 500.0000 mg | ORAL_TABLET | Freq: Every day | ORAL | 3 refills | Status: DC

## 2016-04-28 NOTE — Progress Notes (Signed)
HPI: Billy Landry is a 66 y.o. male  who presents to [facility] today, 04/28/16,  for chief complaint of:  Chief Complaint  Patient presents with  . Follow-up    BLOOD PRESSURE   Pt here to recheck BP for 6 month followup. Doing well. No CP/SOB, no HA/VC. Checking home Bp with verified cuff. Ocasional numbers w/ Systolic >245 but mostly 809X, 110s.   Cold sore - requests refill of valacyclovir.   Past medical history, surgical history, social history and family history reviewed.  Patient Active Problem List   Diagnosis Date Noted  . Skin problem 02/17/2016  . Macrocytosis without anemia 02/17/2016  . History of skin cancer 10/26/2015  . Essential hypertension 01/05/2015    Current medication list and allergy/intolerance information reviewed.   Current Outpatient Prescriptions on File Prior to Visit  Medication Sig Dispense Refill  . aspirin EC 81 MG tablet Take 81 mg by mouth daily.    . Cholecalciferol (VITAMIN D PO) Take 4,000 Int'l Units by mouth daily.    . cyanocobalamin 2000 MCG tablet Take 2,000 mcg by mouth daily.    Marland Kitchen L-Lysine 1000 MG TABS Take by mouth.    . loratadine (CLARITIN) 10 MG tablet Take 10 mg by mouth daily.    Marland Kitchen losartan (COZAAR) 50 MG tablet Take 1 tablet (50 mg total) by mouth daily. APPOINTMENT NEEDED FOR FURTHER REFILLS 90 tablet 0  . Melatonin 5 MG TABS Take by mouth.    . vitamin E 400 UNIT capsule Take 400 Units by mouth daily.     No current facility-administered medications on file prior to visit.    Allergies  Allergen Reactions  . Bee Venom Anaphylaxis      Review of Systems:  Constitutional: No recent illness, feeling well today   HEENT: No  headache, no vision change  Cardiac: No  chest pain, No  pressure, No palpitations  Respiratory:  No  shortness of breath. No  Cough  Exam:  BP 128/76   Pulse 67   Ht 5' 10"  (1.778 m)   Wt 179 lb (81.2 kg)   BMI 25.68 kg/m   Constitutional: VS see  above. General Appearance: alert, well-developed, well-nourished, NAD  Neck: No masses, trachea midline.   Respiratory: Normal respiratory effort. no wheeze, no rhonchi, no rales  Cardiovascular: S1/S2 normal, no murmur, no rub/gallop auscultated. RRR.    Recent Results (from the past 2160 hour(s))  Pathologist smear review     Status: None   Collection Time: 02/12/16 11:16 AM  Result Value Ref Range   Path Review SEE NOTE     Comment: Myeloid population consists predominantly of mature segmented neutrophils.  No immature cells are identified.  Anemia, with RBC's which appear to be macrocytic on smear review. Suggest evaluation for vitamin deficiency, if clinically indicated.  Platelets are unremarkable. Reviewed by Francis Gaines Mammarappallil MD (Electronic Signature on File) 02/15/2016.   CBC with Differential/Platelet     Status: Abnormal   Collection Time: 02/12/16 11:19 AM  Result Value Ref Range   WBC 5.4 3.8 - 10.8 K/uL   RBC 4.02 (L) 4.20 - 5.80 MIL/uL   Hemoglobin 14.3 13.2 - 17.1 g/dL   HCT 42.5 38.5 - 50.0 %   MCV 105.7 (H) 80.0 - 100.0 fL   MCH 35.6 (H) 27.0 - 33.0 pg   MCHC 33.6 32.0 - 36.0 g/dL   RDW 13.6 11.0 - 15.0 %   Platelets 169 140 - 400  K/uL   MPV 12.4 7.5 - 12.5 fL   Neutro Abs 2,754 1,500 - 7,800 cells/uL   Lymphs Abs 1,836 850 - 3,900 cells/uL   Monocytes Absolute 594 200 - 950 cells/uL   Eosinophils Absolute 162 15 - 500 cells/uL   Basophils Absolute 54 0 - 200 cells/uL   Neutrophils Relative % 51 %   Lymphocytes Relative 34 %   Monocytes Relative 11 %   Eosinophils Relative 3 %   Basophils Relative 1 %   Smear Review Criteria for review not met   Vitamin B12     Status: None   Collection Time: 02/12/16 11:19 AM  Result Value Ref Range   Vitamin B-12 975 200 - 1,100 pg/mL       ASSESSMENT/PLAN:   meds refilled, labs reviewed. Discussed BP goals as 130/80 or less, bring home cuff to office for verification at least annually  Essential  hypertension - Plan: losartan (COZAAR) 50 MG tablet  Cold sore - Plan: valACYclovir (VALTREX) 1000 MG tablet  Macrocytosis without anemia    Follow-up plan: Return in about 9 months (around 01/26/2017) for [place].  Visit summary with medication list and pertinent instructions was printed for patient to review, alert [place] if any changes needed. All questions at time of visit were answered - patient instructed to contact office with any additional concerns. ER/RTC precautions were reviewed with the patient and understanding verbalized.

## 2016-05-05 ENCOUNTER — Telehealth: Payer: Self-pay

## 2016-05-05 NOTE — Telephone Encounter (Signed)
PATIENT DECLINED FLU SHOT

## 2016-06-24 ENCOUNTER — Other Ambulatory Visit: Payer: Self-pay | Admitting: Osteopathic Medicine

## 2016-06-24 DIAGNOSIS — I10 Essential (primary) hypertension: Secondary | ICD-10-CM

## 2016-10-24 ENCOUNTER — Other Ambulatory Visit: Payer: Self-pay

## 2016-11-08 ENCOUNTER — Other Ambulatory Visit: Payer: Self-pay | Admitting: Osteopathic Medicine

## 2016-11-08 ENCOUNTER — Other Ambulatory Visit: Payer: Self-pay

## 2016-11-08 DIAGNOSIS — I10 Essential (primary) hypertension: Secondary | ICD-10-CM

## 2016-11-08 MED ORDER — LOSARTAN POTASSIUM 50 MG PO TABS: 50.0000 mg | ORAL_TABLET | Freq: Every day | ORAL | 0 refills | Status: DC

## 2016-12-20 DIAGNOSIS — H35372 Puckering of macula, left eye: Secondary | ICD-10-CM | POA: Diagnosis not present

## 2016-12-20 DIAGNOSIS — H43393 Other vitreous opacities, bilateral: Secondary | ICD-10-CM | POA: Diagnosis not present

## 2016-12-20 DIAGNOSIS — H04123 Dry eye syndrome of bilateral lacrimal glands: Secondary | ICD-10-CM | POA: Diagnosis not present

## 2016-12-20 DIAGNOSIS — H2511 Age-related nuclear cataract, right eye: Secondary | ICD-10-CM | POA: Diagnosis not present

## 2016-12-20 DIAGNOSIS — H5203 Hypermetropia, bilateral: Secondary | ICD-10-CM | POA: Diagnosis not present

## 2016-12-29 DIAGNOSIS — L821 Other seborrheic keratosis: Secondary | ICD-10-CM | POA: Diagnosis not present

## 2016-12-29 DIAGNOSIS — Z85828 Personal history of other malignant neoplasm of skin: Secondary | ICD-10-CM | POA: Diagnosis not present

## 2016-12-29 DIAGNOSIS — L570 Actinic keratosis: Secondary | ICD-10-CM | POA: Diagnosis not present

## 2016-12-29 DIAGNOSIS — L814 Other melanin hyperpigmentation: Secondary | ICD-10-CM | POA: Diagnosis not present

## 2016-12-29 DIAGNOSIS — D229 Melanocytic nevi, unspecified: Secondary | ICD-10-CM | POA: Diagnosis not present

## 2016-12-29 DIAGNOSIS — L301 Dyshidrosis [pompholyx]: Secondary | ICD-10-CM | POA: Diagnosis not present

## 2017-01-24 ENCOUNTER — Encounter: Payer: Self-pay | Admitting: Osteopathic Medicine

## 2017-01-24 ENCOUNTER — Ambulatory Visit (INDEPENDENT_AMBULATORY_CARE_PROVIDER_SITE_OTHER): Payer: Medicare Other | Admitting: Osteopathic Medicine

## 2017-01-24 VITALS — BP 157/89 | HR 65 | Ht 70.0 in | Wt 179.0 lb

## 2017-01-24 DIAGNOSIS — D7589 Other specified diseases of blood and blood-forming organs: Secondary | ICD-10-CM

## 2017-01-24 DIAGNOSIS — N5319 Other ejaculatory dysfunction: Secondary | ICD-10-CM

## 2017-01-24 DIAGNOSIS — Z0000 Encounter for general adult medical examination without abnormal findings: Secondary | ICD-10-CM | POA: Diagnosis not present

## 2017-01-24 DIAGNOSIS — Z125 Encounter for screening for malignant neoplasm of prostate: Secondary | ICD-10-CM

## 2017-01-24 DIAGNOSIS — Z2821 Immunization not carried out because of patient refusal: Secondary | ICD-10-CM | POA: Diagnosis not present

## 2017-01-24 DIAGNOSIS — Z23 Encounter for immunization: Secondary | ICD-10-CM | POA: Diagnosis not present

## 2017-01-24 DIAGNOSIS — R7989 Other specified abnormal findings of blood chemistry: Secondary | ICD-10-CM | POA: Diagnosis not present

## 2017-01-24 DIAGNOSIS — I10 Essential (primary) hypertension: Secondary | ICD-10-CM

## 2017-01-24 DIAGNOSIS — Z7189 Other specified counseling: Secondary | ICD-10-CM

## 2017-01-24 MED ORDER — LOSARTAN POTASSIUM 50 MG PO TABS: 50.0000 mg | ORAL_TABLET | Freq: Every day | ORAL | 3 refills | Status: DC

## 2017-01-24 MED ORDER — ZOSTER VAC RECOMB ADJUVANTED 50 MCG/0.5ML IM SUSR: 0.5000 mL | Freq: Once | INTRAMUSCULAR | 1 refills | Status: AC

## 2017-01-24 NOTE — Progress Notes (Signed)
HPI: Billy Landry is a 66 y.o. male  who presents to [facility] today, 01/24/17,  for Medicare Annual Wellness Exam  Patient presents for annual physical/Medicare wellness exam.  HTN: controlled on current meds.  Has been sometime since home blood pressure cuff was verified but home readings are looking good, typically 355D-322G systolic.  No chest pain, pressure, shortness of breath.  Reproductive health: Concerned about ejaculatory issue, he is having a hard time.  No significant urinary symptoms such as hesitancy, dribbling, feeling of incomplete emptying.  No diminished libido or erectile dysfunction.   Past medical, surgical, social and family history reviewed:  Patient Active Problem List   Diagnosis Date Noted  . Skin problem 02/17/2016  . Macrocytosis without anemia 02/17/2016  . History of skin cancer 10/26/2015  . Essential hypertension 01/05/2015    Past Surgical History:  Procedure Laterality Date  . CATARACT EXTRACTION Left 2013  . RETINAL DETACHMENT SURGERY  2012  . ROTATOR CUFF REPAIR Right 2010  . TONSILECTOMY, ADENOIDECTOMY, BILATERAL MYRINGOTOMY AND TUBES  1968  . VASECTOMY      Social History   Social History  . Marital status: Divorced    Spouse name: N/A  . Number of children: N/A  . Years of education: N/A   Occupational History  . Not on file.   Social History Main Topics  . Smoking status: Never Smoker  . Smokeless tobacco: Never Used  . Alcohol use 0.0 oz/week  . Drug use: No  . Sexual activity: Yes   Other Topics Concern  . Not on file   Social History Narrative  . No narrative on file   Family History  Problem Relation Age of Onset  . Cancer Mother   . Diabetes Father   . Stroke Father   . Stroke Brother      Current medication list and allergy/intolerance information reviewed:    Outpatient Encounter Prescriptions as of 01/24/2017  Medication Sig  . Cholecalciferol (VITAMIN D PO)  Take 4,000 Int'l Units by mouth daily.  . cyanocobalamin 2000 MCG tablet Take 2,000 mcg by mouth daily.  Marland Kitchen L-Lysine 1000 MG TABS Take by mouth.  . loratadine (CLARITIN) 10 MG tablet Take 10 mg by mouth daily.  Marland Kitchen losartan (COZAAR) 50 MG tablet Take 1 tablet (50 mg total) by mouth daily.  . Melatonin 5 MG TABS Take by mouth.  . valACYclovir (VALTREX) 1000 MG tablet Take 0.5 tablets (500 mg total) by mouth daily.  . vitamin E 400 UNIT capsule Take 400 Units by mouth daily.   No facility-administered encounter medications on file as of 01/24/2017.     Allergies  Allergen Reactions  . Bee Venom Anaphylaxis       Review of Systems: Review of Systems - Negative except as per HPI   Medicare Wellness Questionnaire  Are there smokers in your home (other than you)? no  Depression Screen (Note: if answer to either of the following is "Yes", a more complete depression screening is indicated)   Q1: Over the past two weeks, have you felt down, depressed or hopeless? no  Q2: Over the past two weeks, have you felt little interest or pleasure in doing things? no  Have you lost interest or pleasure in daily life? no  Do you often feel hopeless? no  Do you cry easily over simple problems? no  Activities of Daily Living In your present state of health, do you have any difficulty performing the following activities?:  Driving? no Managing money?  no Feeding yourself? no Getting from bed to chair? no Climbing a flight of stairs? no Preparing food and eating?: no Bathing or showering? no Getting dressed: no Getting to the toilet? no Using the toilet: no Moving around from place to place: no In the past year have you fallen or had a near fall?: no  Hearing Difficulties:  Do you often ask people to speak up or repeat themselves? no Do you experience ringing or noises in your ears? no  Do you have difficulty understanding soft or whispered voices? no  Memory Difficulties:  Do you feel  that you have a problem with memory? no  Do you often misplace items? no  Do you feel safe at home?  yes  Sexual Health:   Are you sexually active?  Yes  Do you have more than one partner?  No  Advanced Directives:   Advanced directives discussed: has an advanced directive - a copy HAS NOT been provided.  Additional information provided: no  Risk Factors  Current exercise habits: none formal  Dietary issues discussed:no concerns   Cardiac risk factors: hypertension  Other doctors: Dental - 12/2016, Ophtho 12/2016, Derm 12/2016   Exam:  BP (!) 157/89   Pulse 65   Ht 5\' 10"  (1.778 m)   Wt 179 lb (81.2 kg)   BMI 25.68 kg/m    Constitutional: VS see above. General Appearance: alert, well-developed, well-nourished, NAD  Ears, Nose, Mouth, Throat: MMM  Neck: No masses, trachea midline.   Respiratory: Normal respiratory effort. no wheeze, no rhonchi, no rales  Cardiovascular:No lower extremity edema.   Musculoskeletal: Gait normal. No clubbing/cyanosis of digits.   Neurological: Normal balance/coordination. No tremor. Recalls 3 objects and able to read face of watch with correct time.   Skin: warm, dry, intact. No rash/ulcer.   Psychiatric: Normal judgment/insight. Normal mood and affect. Oriented x3.     ASSESSMENT/PLAN: Encounter for Medicare annual wellness exam  Medicare annual wellness visit, subsequent  Essential hypertension - Plan: CBC with Differential/Platelet, COMPLETE METABOLIC PANEL WITH GFR, Lipid panel, PSA, losartan (COZAAR) 50 MG tablet  Macrocytosis without anemia  Screening for prostate cancer - Plan: PSA  Other ejaculatory dysfunction - Plan: Ambulatory referral to Urology  Need for shingles vaccine - Plan: Zoster Vaccine Adjuvanted [facility]) injection  Advance directive discussed with patient  Pneumococcal vaccination declined    CANCER SCREENING  Lung - does not need  Colon - does not need  Prostate - does not need  Breast -  does not need  Cervical - does not need OTHER DISEASE SCREENING  Lipid - needs  DM2 - needs  AAA - male 47-75yo ever smoked - does not need  Osteoporosis - women 67yo+, men 67yo+ - does not need INFECTIOUS DISEASE SCREENING  HIV - does not need  GC/CT - does not need  HepC -does not need  TB - does not need ADULT VACCINATION  Influenza - annual vaccine recommended  Td - booster every 10 years - does not need  Zoster - option at 25, yes at 85+ - needs  PCV13 - needs - declined  PPSV23 - not indicated now  Immunization History  Administered Date(s) Administered  . Tdap 10/26/2015   OTHER  Fall - exercise and Vit D age 76+ - does not need  Advanced Directives -  Discussed as above   During the course of the visit the patient was educated and counseled about appropriate screening and preventive services  as noted above.   Patient Instructions (the written plan) was given to the patient.  Medicare Attestation I have personally reviewed: The patient's medical and social history Their use of alcohol, tobacco or illicit drugs Their current medications and supplements The patient's functional ability including ADLs,fall risks, home safety risks, cognitive, and hearing and visual impairment Diet and physical activities Evidence for depression or mood disorders  The patient's weight, height, BMI, and visual acuity have been recorded in the chart.  I have made referrals, counseling, and provided education to the patient based on review of the above and I have provided the patient with a written personalized care plan for preventive services.     Emeterio Reeve, DO   01/24/17   Visit summary with medication list and pertinent instructions was printed for patient to review. All questions at time of visit were answered - patient instructed to contact office with any additional concerns. ER/RTC precautions were reviewed with the patient. Follow-up plan: No Follow-up on  file.

## 2017-01-26 ENCOUNTER — Encounter: Payer: Self-pay | Admitting: Osteopathic Medicine

## 2017-01-26 LAB — LIPID PANEL
CHOLESTEROL: 177 mg/dL (ref <200)
HDL: 53 mg/dL (ref >40)
LDL Cholesterol (Calc): 107 mg/dL (calc) — ABNORMAL HIGH
Non-HDL Cholesterol (Calc): 124 mg/dL (calc) (ref <130)
TRIGLYCERIDES: 83 mg/dL (ref <150)
Total CHOL/HDL Ratio: 3.3 (calc) (ref <5.0)

## 2017-01-26 LAB — CBC WITH DIFFERENTIAL/PLATELET
Basophils Absolute: 52 cells/uL (ref 0–200)
Basophils Relative: 0.9 %
EOS PCT: 2.4 %
Eosinophils Absolute: 139 cells/uL (ref 15–500)
HCT: 37.2 % — ABNORMAL LOW (ref 38.5–50.0)
Hemoglobin: 13.2 g/dL (ref 13.2–17.1)
Lymphs Abs: 1839 cells/uL (ref 850–3900)
MCH: 36.7 pg — ABNORMAL HIGH (ref 27.0–33.0)
MCHC: 35.5 g/dL (ref 32.0–36.0)
MCV: 103.3 fL — AB (ref 80.0–100.0)
MONOS PCT: 11.3 %
MPV: 12.2 fL (ref 7.5–12.5)
NEUTROS PCT: 53.7 %
Neutro Abs: 3115 cells/uL (ref 1500–7800)
PLATELETS: 151 10*3/uL (ref 140–400)
RBC: 3.6 10*6/uL — AB (ref 4.20–5.80)
RDW: 12.6 % (ref 11.0–15.0)
TOTAL LYMPHOCYTE: 31.7 %
WBC: 5.8 10*3/uL (ref 3.8–10.8)
WBCMIX: 655 {cells}/uL (ref 200–950)

## 2017-01-26 LAB — COMPLETE METABOLIC PANEL WITH GFR
AG RATIO: 1.8 (calc) (ref 1.0–2.5)
ALT: 44 U/L (ref 9–46)
AST: 37 U/L — AB (ref 10–35)
Albumin: 4.2 g/dL (ref 3.6–5.1)
Alkaline phosphatase (APISO): 63 U/L (ref 40–115)
BILIRUBIN TOTAL: 0.9 mg/dL (ref 0.2–1.2)
BUN/Creatinine Ratio: 33 (calc) — ABNORMAL HIGH (ref 6–22)
BUN: 27 mg/dL — AB (ref 7–25)
CHLORIDE: 106 mmol/L (ref 98–110)
CO2: 28 mmol/L (ref 20–32)
Calcium: 9.8 mg/dL (ref 8.6–10.3)
Creat: 0.83 mg/dL (ref 0.70–1.25)
GFR, EST AFRICAN AMERICAN: 106 mL/min/{1.73_m2} (ref >=60)
GFR, Est Non African American: 91 mL/min/{1.73_m2} (ref >=60)
GLUCOSE: 90 mg/dL (ref 65–99)
Globulin: 2.3 g/dL (calc) (ref 1.9–3.7)
POTASSIUM: 4.5 mmol/L (ref 3.5–5.3)
Sodium: 142 mmol/L (ref 135–146)
TOTAL PROTEIN: 6.5 g/dL (ref 6.1–8.1)

## 2017-01-26 LAB — TEST AUTHORIZATION

## 2017-01-26 LAB — VITAMIN B12: Vitamin B-12: 546 pg/mL (ref 200–1100)

## 2017-01-26 LAB — FOLATE: FOLATE: 11.6 ng/mL

## 2017-01-26 LAB — PSA: PSA: 1.3 ng/mL (ref <=4.0)

## 2017-01-26 NOTE — Addendum Note (Signed)
Addended by: Maryla Morrow on: 01/26/2017 07:05 AM   Modules accepted: Orders

## 2017-03-09 DIAGNOSIS — R351 Nocturia: Secondary | ICD-10-CM | POA: Diagnosis not present

## 2017-03-09 DIAGNOSIS — R3912 Poor urinary stream: Secondary | ICD-10-CM | POA: Diagnosis not present

## 2017-03-09 DIAGNOSIS — Z125 Encounter for screening for malignant neoplasm of prostate: Secondary | ICD-10-CM | POA: Diagnosis not present

## 2017-03-13 ENCOUNTER — Encounter: Payer: Self-pay | Admitting: Osteopathic Medicine

## 2017-03-13 DIAGNOSIS — N138 Other obstructive and reflux uropathy: Secondary | ICD-10-CM | POA: Insufficient documentation

## 2017-03-13 DIAGNOSIS — N401 Enlarged prostate with lower urinary tract symptoms: Secondary | ICD-10-CM

## 2017-04-04 ENCOUNTER — Other Ambulatory Visit: Payer: Self-pay | Admitting: Osteopathic Medicine

## 2017-04-04 DIAGNOSIS — B001 Herpesviral vesicular dermatitis: Secondary | ICD-10-CM

## 2017-04-09 ENCOUNTER — Other Ambulatory Visit: Payer: Self-pay | Admitting: Osteopathic Medicine

## 2017-04-09 DIAGNOSIS — B001 Herpesviral vesicular dermatitis: Secondary | ICD-10-CM

## 2017-04-17 ENCOUNTER — Other Ambulatory Visit: Payer: Self-pay | Admitting: Osteopathic Medicine

## 2017-04-17 DIAGNOSIS — B001 Herpesviral vesicular dermatitis: Secondary | ICD-10-CM

## 2017-06-13 ENCOUNTER — Other Ambulatory Visit: Payer: Self-pay | Admitting: Osteopathic Medicine

## 2017-06-13 ENCOUNTER — Other Ambulatory Visit: Payer: Self-pay

## 2017-06-13 DIAGNOSIS — I10 Essential (primary) hypertension: Secondary | ICD-10-CM

## 2017-06-13 DIAGNOSIS — B001 Herpesviral vesicular dermatitis: Secondary | ICD-10-CM

## 2017-06-13 MED ORDER — VALACYCLOVIR HCL 1 G PO TABS: 500.0000 mg | ORAL_TABLET | Freq: Every day | ORAL | 0 refills | Status: DC

## 2017-07-11 ENCOUNTER — Other Ambulatory Visit: Payer: Self-pay | Admitting: Osteopathic Medicine

## 2017-07-11 DIAGNOSIS — B001 Herpesviral vesicular dermatitis: Secondary | ICD-10-CM

## 2017-07-12 NOTE — Telephone Encounter (Signed)
[facility] requesting med refill for valtrex. Pls advise if refill appropriate. Thanks

## 2017-07-12 NOTE — Telephone Encounter (Signed)
Pt has been updated.  

## 2017-07-25 ENCOUNTER — Ambulatory Visit: Payer: Medicare Other

## 2017-08-18 ENCOUNTER — Encounter: Payer: Self-pay | Admitting: Osteopathic Medicine

## 2017-08-18 ENCOUNTER — Ambulatory Visit (INDEPENDENT_AMBULATORY_CARE_PROVIDER_SITE_OTHER): Payer: 59 | Admitting: Osteopathic Medicine

## 2017-08-18 VITALS — BP 112/54 | HR 67

## 2017-08-18 DIAGNOSIS — I10 Essential (primary) hypertension: Secondary | ICD-10-CM

## 2017-08-18 DIAGNOSIS — Z91030 Bee allergy status: Secondary | ICD-10-CM | POA: Diagnosis not present

## 2017-08-18 MED ORDER — EPINEPHRINE 0.3 MG/0.3ML IJ SOAJ: 0.3000 mg | Freq: Once | INTRAMUSCULAR | 0 refills | Status: AC

## 2017-08-18 NOTE — Progress Notes (Signed)
Pt came into clinic today for BP check. He brought his home BP cuff and home readings. Our machine read: 112/54, pulse 67. His home machine read: 118/56, pulse 68. Pt's home readings were within normal range except for a couple that were elevated. Pt denies any chest pain, dizziness. Went over readings with PCP. No changes. Pt advised to keep his follow up with PCP as scheduled.  Pt does need a new Epipen Rx, the one he has at home has expired. He is allergic to bees. Will pend for PCP approval.

## 2017-08-18 NOTE — Progress Notes (Signed)
See problem list - BP ok and home cuff is accurate, will watch diastolic but no symptoms, ok to continue current meds

## 2017-10-02 ENCOUNTER — Other Ambulatory Visit: Payer: Self-pay | Admitting: Osteopathic Medicine

## 2017-10-02 DIAGNOSIS — B001 Herpesviral vesicular dermatitis: Secondary | ICD-10-CM

## 2017-10-02 MED ORDER — VALACYCLOVIR HCL 1 G PO TABS: ORAL_TABLET | ORAL | 0 refills | Status: DC

## 2017-10-02 NOTE — Telephone Encounter (Signed)
RF verbally called in since system is not able to send electronic RF to [facility]

## 2017-10-02 NOTE — Addendum Note (Signed)
Addended by: Towana Badger on: 10/02/2017 11:27 AM   Modules accepted: Orders

## 2017-11-30 ENCOUNTER — Other Ambulatory Visit: Payer: Self-pay | Admitting: Osteopathic Medicine

## 2017-11-30 DIAGNOSIS — I10 Essential (primary) hypertension: Secondary | ICD-10-CM

## 2017-12-05 ENCOUNTER — Other Ambulatory Visit: Payer: Self-pay

## 2017-12-05 DIAGNOSIS — B001 Herpesviral vesicular dermatitis: Secondary | ICD-10-CM

## 2017-12-05 MED ORDER — VALACYCLOVIR HCL 1 G PO TABS: ORAL_TABLET | ORAL | 0 refills | Status: DC

## 2017-12-19 ENCOUNTER — Telehealth: Payer: Self-pay | Admitting: Osteopathic Medicine

## 2017-12-19 DIAGNOSIS — I10 Essential (primary) hypertension: Secondary | ICD-10-CM

## 2017-12-20 MED ORDER — LOSARTAN POTASSIUM 50 MG PO TABS: 50.0000 mg | ORAL_TABLET | Freq: Every day | ORAL | 1 refills | Status: DC

## 2017-12-20 NOTE — Telephone Encounter (Signed)
Medication electronically refilled.

## 2017-12-20 NOTE — Telephone Encounter (Signed)
Noted. Pt has been updated by pharmacy of med RF.

## 2017-12-26 DIAGNOSIS — H04123 Dry eye syndrome of bilateral lacrimal glands: Secondary | ICD-10-CM | POA: Diagnosis not present

## 2017-12-26 DIAGNOSIS — H5203 Hypermetropia, bilateral: Secondary | ICD-10-CM | POA: Diagnosis not present

## 2017-12-26 DIAGNOSIS — B029 Zoster without complications: Secondary | ICD-10-CM

## 2017-12-26 DIAGNOSIS — H43393 Other vitreous opacities, bilateral: Secondary | ICD-10-CM | POA: Diagnosis not present

## 2017-12-26 DIAGNOSIS — H2511 Age-related nuclear cataract, right eye: Secondary | ICD-10-CM | POA: Diagnosis not present

## 2017-12-26 DIAGNOSIS — H33002 Unspecified retinal detachment with retinal break, left eye: Secondary | ICD-10-CM | POA: Diagnosis not present

## 2017-12-26 HISTORY — DX: Zoster without complications: B02.9

## 2018-01-05 DIAGNOSIS — L570 Actinic keratosis: Secondary | ICD-10-CM | POA: Diagnosis not present

## 2018-01-05 DIAGNOSIS — L820 Inflamed seborrheic keratosis: Secondary | ICD-10-CM | POA: Diagnosis not present

## 2018-01-05 DIAGNOSIS — Z85828 Personal history of other malignant neoplasm of skin: Secondary | ICD-10-CM | POA: Diagnosis not present

## 2018-01-05 DIAGNOSIS — L309 Dermatitis, unspecified: Secondary | ICD-10-CM | POA: Diagnosis not present

## 2018-01-05 DIAGNOSIS — L821 Other seborrheic keratosis: Secondary | ICD-10-CM | POA: Diagnosis not present

## 2018-01-05 DIAGNOSIS — D229 Melanocytic nevi, unspecified: Secondary | ICD-10-CM | POA: Diagnosis not present

## 2018-01-05 DIAGNOSIS — L814 Other melanin hyperpigmentation: Secondary | ICD-10-CM | POA: Diagnosis not present

## 2018-01-16 ENCOUNTER — Emergency Department (INDEPENDENT_AMBULATORY_CARE_PROVIDER_SITE_OTHER)
Admission: EM | Admit: 2018-01-16 | Discharge: 2018-01-16 | Disposition: A | Discharge status: Home / Self Care | Payer: Medicare Other | Source: Home / Self Care | Attending: Family Medicine | Admitting: Family Medicine

## 2018-01-16 ENCOUNTER — Other Ambulatory Visit: Payer: Self-pay

## 2018-01-16 ENCOUNTER — Encounter: Payer: Self-pay | Admitting: Emergency Medicine

## 2018-01-16 DIAGNOSIS — R21 Rash and other nonspecific skin eruption: Secondary | ICD-10-CM

## 2018-01-16 DIAGNOSIS — Z0189 Encounter for other specified special examinations: Secondary | ICD-10-CM | POA: Diagnosis not present

## 2018-01-16 MED ORDER — MUPIROCIN 2 % EX OINT: 1.0000 "application " | TOPICAL_OINTMENT | Freq: Three times a day (TID) | CUTANEOUS | 0 refills | Status: DC

## 2018-01-16 MED ORDER — VALACYCLOVIR HCL 1 G PO TABS: 1000.0000 mg | ORAL_TABLET | Freq: Three times a day (TID) | ORAL | 0 refills | Status: DC

## 2018-01-16 NOTE — ED Provider Notes (Signed)
[name]    CSN: [id number] Arrival date & time: 01/16/18  1200     History   Chief Complaint Chief Complaint  Patient presents with  . Rash    HPI Mustaf Antonacci is a 67 y.o. male.   Patient awoke today with redness and a blister on his right ulnar wrist.  He recalls no injury or insect bite and feels well otherwise.  He takes Valtrex 500mg  daily for prophylaxis against cold sores.  Yesterday he felt an early cold sore developing on his lower lip and last night he increased his dose of Valtrex to 1500mg .  The history is provided by the patient.  Rash  Location: right wrist. Quality: blistering, dryness and redness   Quality: not itchy, not painful, not peeling, not swelling and not weeping   Severity:  Mild Onset quality:  Sudden Duration:  5 hours Timing:  Constant Progression:  Worsening Chronicity:  New Context: not animal contact, not chemical exposure, not exposure to similar rash, not food, not hot tub use, not insect bite/sting, not medications, not new detergent/soap and not plant contact   Relieved by:  None tried Worsened by:  Nothing Ineffective treatments:  None tried Associated symptoms: fatigue   Associated symptoms: no abdominal pain, no diarrhea, no fever, no headaches, no hoarse voice, no induration, no joint pain, no myalgias, no nausea, no shortness of breath, no sore throat, no throat swelling, no tongue swelling, no URI and not wheezing     Past Medical History:  Diagnosis Date  . Allergy   . Essential hypertension 01/05/2015  . Hypertension     Patient Active Problem List   Diagnosis Date Noted  . 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 Skin problem 02/17/2016  . Macrocytosis without anemia 02/17/2016  . History of skin cancer 10/26/2015  . Essential hypertension 01/05/2015    Past Surgical History:  Procedure Laterality Date  . CATARACT EXTRACTION Left 2013  . RETINAL DETACHMENT SURGERY  2012    . ROTATOR CUFF REPAIR Right 2010  . TONSILECTOMY, ADENOIDECTOMY, BILATERAL MYRINGOTOMY AND TUBES  1968  . VASECTOMY         Home Medications    Prior to Admission medications   Medication Sig Start Date End Date Taking? Authorizing Provider  Ascorbic Acid (VITAMIN C PO) Take by mouth.   Yes [provider]  Cholecalciferol (VITAMIN D-1000 MAX ST) 1000 units tablet Take by mouth.   Yes [provider]  Cinnamon 500 MG capsule Take by mouth.   Yes [provider]  doxazosin (CARDURA) 2 MG tablet Take 2 mg by mouth daily. 07/11/17  Yes [provider]  L-Lysine 1000 MG TABS Take by mouth.   Yes [provider]  loratadine (CLARITIN) 10 MG tablet Take 10 mg by mouth daily.   Yes [provider]  losartan (COZAAR) 50 MG tablet Take 1 tablet (50 mg total) by mouth daily. 12/20/17  Yes Gregor Hams, MD  Melatonin 5 MG TABS Take by mouth.   Yes [provider]  valACYclovir (VALTREX) 1000 MG tablet TAKE 1/2 TAB BY MOUTH EVERY DAY 12/05/17  Yes Emeterio Reeve, DO  vitamin E 400 UNIT capsule Take 400 Units by mouth daily.   Yes [provider]  mupirocin ointment (BACTROBAN) 2 % Apply 1 application topically 3 (three) times daily. 01/16/18   Kandra Nicolas, MD  valACYclovir (VALTREX) 1000 MG tablet Take 1 tablet (1,000 mg total) by mouth 3 (three)  times daily. 01/16/18   Kandra Nicolas, MD    Family History Family History  Problem Relation Age of Onset  . Cancer Mother   . Diabetes Father   . Stroke Father   . Stroke Brother     Social History Social History   Tobacco Use  . Smoking status: Never Smoker  . Smokeless tobacco: Never Used  Substance Use Topics  . Alcohol use: Yes    Alcohol/week: 0.0 standard drinks  . Drug use: No     Allergies   Bee venom   Review of Systems Review of Systems  Constitutional: Positive for fatigue. Negative for fever.  HENT: Negative for hoarse voice and sore  throat.   Respiratory: Negative for shortness of breath and wheezing.   Gastrointestinal: Negative for abdominal pain, diarrhea and nausea.  Musculoskeletal: Negative for arthralgias and myalgias.  Skin: Positive for rash.  Neurological: Negative for headaches.  All other systems reviewed and are negative.    Physical Exam Triage Vital Signs ED Triage Vitals [01/16/18 1232]  Enc Vitals Group     BP (!) 167/99     Pulse Rate (!) 55     Resp      Temp 97.7 F (36.5 C)     Temp Source Oral     SpO2 99 %     Weight 180 lb 12 oz (82 kg)     Height 5\' 10"  (1.778 m)     Head Circumference      Peak Flow      Pain Score 0     Pain Loc      Pain Edu?      Excl. in [place]?    No data found.  Updated Vital Signs BP (!) 167/99 (BP Location: Right Arm)   Pulse (!) 55   Temp 97.7 F (36.5 C) (Oral)   Ht 5\' 10"  (1.778 m)   Wt 82 kg   SpO2 99%   BMI 25.93 kg/m   Visual Acuity Right Eye Distance:   Left Eye Distance:   Bilateral Distance:    Right Eye Near:   Left Eye Near:    Bilateral Near:     Physical Exam  Constitutional: He appears well-developed and well-nourished. No distress.  HENT:  Head: Normocephalic.  Right Ear: External ear normal.  Left Ear: External ear normal.  Nose: Nose normal.  Mouth/Throat:    Developing cold sore mid-lower lip.  Neck: No JVD present. No thyromegaly present.  Cardiovascular: Normal rate and normal heart sounds.  Pulmonary/Chest: Breath sounds normal.  Musculoskeletal: He exhibits no edema.       Hands: Right volar ulnar wrist has 2cm diameter patch of erythema including a 46mm diameter vesicle containing clear fluid and a smaller crop of vesicles containing clear fluid.  Neurological: He is alert.  Skin: Skin is warm and dry.  Nursing note and vitals reviewed.    UC Treatments / Results  Labs (all labs ordered are listed, but only abnormal results are displayed) Labs Reviewed  WOUND CULTURE    EKG None  Radiology No  results found.  Procedures Procedures (including critical care time)  Medications Ordered in UC Medications - No data to display  Initial Impression / Assessment and Plan / UC Course  I have reviewed the triage vital signs and the nursing notes.  Pertinent labs & imaging results that were available during my care of the patient were reviewed by me and considered in my medical decision making (see  chart for details).     Suspect that lesion right wrist represents attenuated herpes zoster (note that patient already takes 500mg  Valtrex daily for cold sores, and that he increased his dose to 1.5gm last night because of a developing cold sore).  Begin Valtrex 1gm TID. As a precaution, will culture fluid from bulla on lesion, and begin mupirocin ointment.  Followup with Family Doctor if not improved in one week.    Final Clinical Impressions(s) / UC Diagnoses   Final diagnoses:  Rash and nonspecific skin eruption     Discharge Instructions     Increase Valtrex to one gram by mouth three times daily.    ED Prescriptions    Medication Sig Dispense Auth. Provider   mupirocin ointment (BACTROBAN) 2 % Apply 1 application topically 3 (three) times daily. 22 g Kandra Nicolas, MD   valACYclovir (VALTREX) 1000 MG tablet Take 1 tablet (1,000 mg total) by mouth 3 (three) times daily. 21 tablet Kandra Nicolas, MD        Kandra Nicolas, MD 01/18/18 [phone]

## 2018-01-16 NOTE — ED Triage Notes (Signed)
Pt woke up this morning with a redness, swelling and a blister to his right wrist.  He denies any pain or itching.

## 2018-01-16 NOTE — Discharge Instructions (Addendum)
Increase Valtrex to one gram by mouth three times daily.

## 2018-01-17 NOTE — Progress Notes (Signed)
Subjective:   Billy Landry is a 67 y.o. male who presents for Medicare Annual/Subsequent preventive examination.  Review of Systems:  No ROS.  Medicare Wellness Visit. Additional risk factors are reflected in the social history.  Cardiac Risk Factors include: hypertension;advanced age (>24men, >80 women) Sleep patterns:  Getting around 8-9 hours of sleep a night. Wakes up during the night to go to bathroom anywhere from 2-5 times a night. When wakes up feels rested.  Home Safety/Smoke Alarms: Feels safe in home. Smoke alarms in place.  Living environment; Lives alone in a 1 story home.  Shower is a step over tub. No grab bars in place.     Male:   CCS-  utd   PSA- ordered labs Lab Results  Component Value Date   PSA 1.3 01/24/2017   PSA 1.5 02/29/2012       Objective:    Vitals: BP 135/78   Pulse 85   Ht 5\' 10"  (1.778 m)   Wt 181 lb (82.1 kg)   SpO2 100%   BMI 25.97 kg/m   Body mass index is 25.97 kg/m.  Advanced Directives 01/29/2018 01/05/2015  Does Patient Have a Medical Advance Directive? Yes No  Type of Advance Directive Living will -  Does patient want to make changes to medical advance directive? No - Patient declined -  Would patient like information on creating a medical advance directive? - No - patient declined information    Tobacco Social History   Tobacco Use  Smoking Status Never Smoker  Smokeless Tobacco Never Used     Counseling given: Not Answered   Clinical Intake:  Pre-visit preparation completed: Yes  Pain : No/denies pain     Nutritional Risks: None Diabetes: No  How often do you need to have someone help you when you read instructions, pamphlets, or other written materials from your doctor or pharmacy?: 1 - Never What is the last grade level you completed in school?: 16  Interpreter Needed?: No  Information entered by :: Orlie Dakin, LPN  Past Medical History:  Diagnosis Date  . Allergy   . Essential hypertension  01/05/2015  . Hypertension   . Shingles 12/2017   Past Surgical History:  Procedure Laterality Date  . CATARACT EXTRACTION Left 2013  . RETINAL DETACHMENT SURGERY  2012  . ROTATOR CUFF REPAIR Right 2010  . TONSILECTOMY, ADENOIDECTOMY, BILATERAL MYRINGOTOMY AND TUBES  1968  . VASECTOMY     Family History  Problem Relation Age of Onset  . Cancer Mother   . Diabetes Father   . Stroke Father   . Stroke Brother    Social History   Socioeconomic History  . Marital status: Single    Spouse name: Not on file  . Number of children: 3  . Years of education: 16  . Highest education level: Bachelor's degree (e.g., BA, AB, BS)  Occupational History  . Occupation: retired    Comment: Pharmacist, hospital  . Financial resource strain: Not hard at all  . Food insecurity:    Worry: Never true    Inability: Never true  . Transportation needs:    Medical: No    Non-medical: No  Tobacco Use  . Smoking status: Never Smoker  . Smokeless tobacco: Never Used  Substance and Sexual Activity  . Alcohol use: Yes    Alcohol/week: 2.0 standard drinks    Types: 2 Cans of beer per week    Comment: just on Fridays  . Drug use:  No  . Sexual activity: Yes  Lifestyle  . Physical activity:    Days per week: 0 days    Minutes per session: 0 min  . Stress: Not at all  Relationships  . Social connections:    Talks on phone: More than three times a week    Gets together: Twice a week    Attends religious service: Never    Active member of club or organization: No    Attends meetings of clubs or organizations: Never    Relationship status: Not on file  Other Topics Concern  . Not on file  Social History Narrative   Did work for [employer]. Currently looking for a job. Caffeine 1 cup of coffee in the morning.    Outpatient Encounter Medications as of 01/29/2018  Medication Sig  . Ascorbic Acid (VITAMIN C PO) Take by mouth.  . Cholecalciferol (VITAMIN D-1000 MAX ST) 1000  units tablet Take by mouth.  . Cinnamon 500 MG capsule Take by mouth.  . doxazosin (CARDURA) 2 MG tablet Take 2 mg by mouth daily.  Marland Kitchen L-Lysine 1000 MG TABS Take by mouth.  . loratadine (CLARITIN) 10 MG tablet Take 10 mg by mouth daily.  Marland Kitchen losartan (COZAAR) 50 MG tablet Take 1 tablet (50 mg total) by mouth daily.  . Melatonin 5 MG TABS Take by mouth.  . valACYclovir (VALTREX) 1000 MG tablet TAKE 1/2 TAB BY MOUTH EVERY DAY  . vitamin E 400 UNIT capsule Take 400 Units by mouth daily.  . mupirocin ointment (BACTROBAN) 2 % Apply 1 application topically 3 (three) times daily. (Patient not taking: Reported on 01/29/2018)  . valACYclovir (VALTREX) 1000 MG tablet Take 1 tablet (1,000 mg total) by mouth 3 (three) times daily. (Patient not taking: Reported on 01/29/2018)   No facility-administered encounter medications on file as of 01/29/2018.     Activities of Daily Living In your present state of health, do you have any difficulty performing the following activities: 01/29/2018  Hearing? N  Vision? N  Difficulty concentrating or making decisions? N  Walking or climbing stairs? N  Dressing or bathing? N  Doing errands, shopping? N  Preparing Food and eating ? N  Using the Toilet? N  In the past six months, have you accidently leaked urine? N  Comment takes Doxasosin  Do you have problems with loss of bowel control? N  Managing your Medications? N  Managing your Finances? N  Housekeeping or managing your Housekeeping? N  Some recent data might be hidden    Patient Care Team: Emeterio Reeve, DO as PCP - General (Osteopathic Medicine)   Assessment:   This is a routine wellness examination for [place].Physical assessment deferred to PCP.   Exercise Activities and Dietary recommendations Current Exercise Habits: The patient does not participate in regular exercise at present, Exercise limited by: cardiac condition(s) Diet Eats healthy getting fruits and vegetables in daily. Breakfast:  coffee, [place] or eggs Lunch: nothing Dinner: fish, vegetables or a salad      Goals    . Exercise 3x per week (30 min per time)     Get out and walk 3 times a week for at least 30 minutes at a time. Increase as tolerated       Fall Risk Fall Risk  01/29/2018 01/24/2017 10/24/2016  Falls in the past year? 1 No No  Comment - - Emmi Telephone Survey: data to providers prior to load  Number falls in past yr: 0 - -  Injury with Fall? 0 - -  Follow up Falls prevention discussed - -   Is the patient's home free of loose throw rugs in walkways, pet beds, electrical cords, etc?   yes      Grab bars in the bathroom? no      Handrails on the stairs?   no      Adequate lighting?   yes   Depression Screen PHQ 2/9 Scores 01/29/2018 01/24/2017  PHQ - 2 Score 0 0    Cognitive Function     6CIT Screen 01/29/2018  What Year? 0 points  What month? 0 points  What time? 0 points  Count back from 20 0 points  Months in reverse 0 points  Repeat phrase 0 points  Total Score 0    Immunization History  Administered Date(s) Administered  . Tdap 10/26/2015     Screening Tests Health Maintenance  Topic Date Due  . INFLUENZA VACCINE  10/26/2017  . PNA vac Low Risk Adult (1 of 2 - PCV13) 03/28/2018 (Originally 07/15/2014)  . COLONOSCOPY  03/29/2019  . TETANUS/TDAP  10/25/2025  . Hepatitis C Screening  Completed        Plan:    Please schedule your next medicare wellness visit with me in 1 yr.   Mr. Pfiester , Thank you for taking time to come for your Medicare Wellness Visit. I appreciate your ongoing commitment to your health goals. Please review the following plan we discussed and let me know if I can assist you in the future.  Continue doing brain stimulating activities (puzzles, reading, adult coloring books, staying active) to keep memory sharp.  Bring a copy of your living will and/or healthcare power of attorney to your next office visit.   These are the goals we  discussed: Goals    . Exercise 3x per week (30 min per time)     Get out and walk 3 times a week for at least 30 minutes at a time. Increase as tolerated       This is a list of the screening recommended for you and due dates:  Health Maintenance  Topic Date Due  . Flu Shot  10/26/2017  . Pneumonia vaccines (1 of 2 - PCV13) 03/28/2018*  . Colon Cancer Screening  03/29/2019  . Tetanus Vaccine  10/25/2025  .  Hepatitis C: One time screening is recommended by Center for Disease Control  (CDC) for  adults born from [birth year] through 1966.   Completed  *Topic was postponed. The date shown is not the original due date.     I have personally reviewed and noted the following in the patient's chart:   . Medical and social history . Use of alcohol, tobacco or illicit drugs  . Current medications and supplements . Functional ability and status . Nutritional status . Physical activity . Advanced directives . List of other physicians . Hospitalizations, surgeries, and ER visits in previous 12 months . Vitals . Screenings to include cognitive, depression, and falls . Referrals and appointments  In addition, I have reviewed and discussed with patient certain preventive protocols, quality metrics, and best practice recommendations. A written personalized care plan for preventive services as well as general preventive health recommendations were provided to patient.     Joanne Chars, LPN  [id number]/7622

## 2018-01-19 ENCOUNTER — Telehealth: Payer: Self-pay | Admitting: Emergency Medicine

## 2018-01-19 LAB — WOUND CULTURE
MICRO NUMBER: 91269089
RESULT:: NO GROWTH
SPECIMEN QUALITY: ADEQUATE

## 2018-01-19 NOTE — Telephone Encounter (Signed)
LM, Culture did not grow bacteria, hope he is healing well, please advise if not.

## 2018-01-29 ENCOUNTER — Ambulatory Visit (INDEPENDENT_AMBULATORY_CARE_PROVIDER_SITE_OTHER): Payer: Medicare Other | Admitting: *Deleted

## 2018-01-29 VITALS — BP 135/78 | HR 85 | Ht 70.0 in | Wt 181.0 lb

## 2018-01-29 DIAGNOSIS — I10 Essential (primary) hypertension: Secondary | ICD-10-CM

## 2018-01-29 DIAGNOSIS — R351 Nocturia: Secondary | ICD-10-CM

## 2018-01-29 DIAGNOSIS — N401 Enlarged prostate with lower urinary tract symptoms: Secondary | ICD-10-CM | POA: Diagnosis not present

## 2018-01-29 DIAGNOSIS — Z0000 Encounter for general adult medical examination without abnormal findings: Secondary | ICD-10-CM

## 2018-01-29 DIAGNOSIS — R7301 Impaired fasting glucose: Secondary | ICD-10-CM

## 2018-01-29 NOTE — Patient Instructions (Signed)
Please schedule your next medicare wellness visit with me in 1 yr. Mr. Bonanno , Thank you for taking time to come for your Medicare Wellness Visit. I appreciate your ongoing commitment to your health goals. Please review the following plan we discussed and let me know if I can assist you in the future.  Continue doing brain stimulating activities (puzzles, reading, adult coloring books, staying active) to keep memory sharp.  Bring a copy of your living will and/or healthcare power of attorney to your next office visit.  These are the goals we discussed: Goals    . Exercise 3x per week (30 min per time)     Get out and walk 3 times a week for at least 30 minutes at a time. Increase as tolerated       Health Maintenance, Male A healthy lifestyle and preventive care is important for your health and wellness. Ask your health care provider about what schedule of regular examinations is right for you. What should I know about weight and diet? Eat a Healthy Diet  Eat plenty of vegetables, fruits, whole grains, low-fat dairy products, and lean protein.  Do not eat a lot of foods high in solid fats, added sugars, or salt.  Maintain a Healthy Weight Regular exercise can help you achieve or maintain a healthy weight. You should:  Do at least 150 minutes of exercise each week. The exercise should increase your heart rate and make you sweat (moderate-intensity exercise).  Do strength-training exercises at least twice a week.  Watch Your Levels of Cholesterol and Blood Lipids  Have your blood tested for lipids and cholesterol every 5 years starting at 68 years of age. If you are at high risk for heart disease, you should start having your blood tested when you are 68 years old. You may need to have your cholesterol levels checked more often if: ? Your lipid or cholesterol levels are high. ? You are older than 68 years of age. ? You are at high risk for heart disease.  What should I know  about cancer screening? Many types of cancers can be detected early and may often be prevented. Lung Cancer  You should be screened every year for lung cancer if: ? You are a current smoker who has smoked for at least 30 years. ? You are a former smoker who has quit within the past 15 years.  Talk to your health care provider about your screening options, when you should start screening, and how often you should be screened.  Colorectal Cancer  Routine colorectal cancer screening usually begins at 68 years of age and should be repeated every 5-10 years until you are 68 years old. You may need to be screened more often if early forms of precancerous polyps or small growths are found. Your health care provider may recommend screening at an earlier age if you have risk factors for colon cancer.  Your health care provider may recommend using home test kits to check for hidden blood in the stool.  A small camera at the end of a tube can be used to examine your colon (sigmoidoscopy or colonoscopy). This checks for the earliest forms of colorectal cancer.  Prostate and Testicular Cancer  Depending on your age and overall health, your health care provider may do certain tests to screen for prostate and testicular cancer.  Talk to your health care provider about any symptoms or concerns you have about testicular or prostate cancer.  Skin Cancer  Check your skin from head to toe regularly.  Tell your health care provider about any new moles or changes in moles, especially if: ? There is a change in a mole's size, shape, or color. ? You have a mole that is larger than a pencil eraser.  Always use sunscreen. Apply sunscreen liberally and repeat throughout the day.  Protect yourself by wearing long sleeves, pants, a wide-brimmed hat, and sunglasses when outside.  What should I know about heart disease, diabetes, and high blood pressure?  If you are 67-36 years of age, have your blood  pressure checked every 3-5 years. If you are 30 years of age or older, have your blood pressure checked every year. You should have your blood pressure measured twice-once when you are at a hospital or clinic, and once when you are not at a hospital or clinic. Record the average of the two measurements. To check your blood pressure when you are not at a hospital or clinic, you can use: ? An automated blood pressure machine at a pharmacy. ? A home blood pressure monitor.  Talk to your health care provider about your target blood pressure.  If you are between 22-47 years old, ask your health care provider if you should take aspirin to prevent heart disease.  Have regular diabetes screenings by checking your fasting blood sugar level. ? If you are at a normal weight and have a low risk for diabetes, have this test once every three years after the age of 15. ? If you are overweight and have a high risk for diabetes, consider being tested at a younger age or more often.  A one-time screening for abdominal aortic aneurysm (AAA) by ultrasound is recommended for men aged 50-75 years who are current or former smokers. What should I know about preventing infection? Hepatitis B If you have a higher risk for hepatitis B, you should be screened for this virus. Talk with your health care provider to find out if you are at risk for hepatitis B infection. Hepatitis C Blood testing is recommended for:  Everyone born from [birth year] through 1966.  Anyone with known risk factors for hepatitis C.  Sexually Transmitted Diseases (STDs)  You should be screened each year for STDs including gonorrhea and chlamydia if: ? You are sexually active and are younger than 68 years of age. ? You are older than 68 years of age and your health care provider tells you that you are at risk for this type of infection. ? Your sexual activity has changed since you were last screened and you are at an increased risk for chlamydia or  gonorrhea. Ask your health care provider if you are at risk.  Talk with your health care provider about whether you are at high risk of being infected with HIV. Your health care provider may recommend a prescription medicine to help prevent HIV infection.  What else can I do?  Schedule regular health, dental, and eye exams.  Stay current with your vaccines (immunizations).  Do not use any tobacco products, such as cigarettes, chewing tobacco, and e-cigarettes. If you need help quitting, ask your health care provider.  Limit alcohol intake to no more than 2 drinks per day. One drink equals 12 ounces of beer, 5 ounces of wine, or 1 ounces of hard liquor.  Do not use street drugs.  Do not share needles.  Ask your health care provider for help if you need support or information about quitting drugs.  Tell  your health care provider if you often feel depressed.  Tell your health care provider if you have ever been abused or do not feel safe at home. This information is not intended to replace advice given to you by your health care provider. Make sure you discuss any questions you have with your health care provider. Document Released: 09/10/2007 Document Revised: 11/11/2015 Document Reviewed: 12/16/2014 Elsevier Interactive Patient Education  [employer].

## 2018-01-30 LAB — LIPID PANEL
CHOLESTEROL: 186 mg/dL (ref <200)
HDL: 48 mg/dL (ref >40)
LDL Cholesterol (Calc): 119 mg/dL (calc) — ABNORMAL HIGH
Non-HDL Cholesterol (Calc): 138 mg/dL (calc) — ABNORMAL HIGH (ref <130)
TRIGLYCERIDES: 88 mg/dL (ref <150)
Total CHOL/HDL Ratio: 3.9 (calc) (ref <5.0)

## 2018-01-30 LAB — COMPLETE METABOLIC PANEL WITH GFR
AG RATIO: 1.9 (calc) (ref 1.0–2.5)
ALBUMIN MSPROF: 4.2 g/dL (ref 3.6–5.1)
ALT: 34 U/L (ref 9–46)
AST: 29 U/L (ref 10–35)
Alkaline phosphatase (APISO): 56 U/L (ref 40–115)
BUN: 25 mg/dL (ref 7–25)
CO2: 29 mmol/L (ref 20–32)
Calcium: 9.7 mg/dL (ref 8.6–10.3)
Chloride: 107 mmol/L (ref 98–110)
Creat: 0.93 mg/dL (ref 0.70–1.25)
GFR, EST AFRICAN AMERICAN: 97 mL/min/{1.73_m2} (ref >=60)
GFR, Est Non African American: 84 mL/min/{1.73_m2} (ref >=60)
GLOBULIN: 2.2 g/dL (ref 1.9–3.7)
GLUCOSE: 106 mg/dL — AB (ref 65–99)
Potassium: 4.3 mmol/L (ref 3.5–5.3)
SODIUM: 141 mmol/L (ref 135–146)
TOTAL PROTEIN: 6.4 g/dL (ref 6.1–8.1)
Total Bilirubin: 0.9 mg/dL (ref 0.2–1.2)

## 2018-01-30 LAB — PSA: PSA: 0.8 ng/mL (ref <=4.0)

## 2018-01-30 NOTE — Addendum Note (Signed)
Addended by: Maryla Morrow on: 01/30/2018 03:13 PM   Modules accepted: Orders

## 2018-01-31 DIAGNOSIS — R7301 Impaired fasting glucose: Secondary | ICD-10-CM | POA: Diagnosis not present

## 2018-01-31 DIAGNOSIS — I10 Essential (primary) hypertension: Secondary | ICD-10-CM | POA: Diagnosis not present

## 2018-02-01 LAB — HEMOGLOBIN A1C
EAG (MMOL/L): 5.7 (calc)
HEMOGLOBIN A1C: 5.2 %{Hb} (ref <5.7)
Mean Plasma Glucose: 103 (calc)

## 2018-03-02 ENCOUNTER — Other Ambulatory Visit: Payer: Self-pay | Admitting: Osteopathic Medicine

## 2018-04-12 DIAGNOSIS — R351 Nocturia: Secondary | ICD-10-CM | POA: Diagnosis not present

## 2018-04-12 DIAGNOSIS — R3912 Poor urinary stream: Secondary | ICD-10-CM | POA: Diagnosis not present

## 2018-04-24 ENCOUNTER — Encounter: Payer: Self-pay | Admitting: Osteopathic Medicine

## 2018-05-07 ENCOUNTER — Encounter: Payer: Self-pay | Admitting: Osteopathic Medicine

## 2018-05-07 ENCOUNTER — Ambulatory Visit (INDEPENDENT_AMBULATORY_CARE_PROVIDER_SITE_OTHER): Payer: Medicare Other | Admitting: Osteopathic Medicine

## 2018-05-07 VITALS — BP 167/94 | HR 53 | Temp 97.5°F | Wt 181.0 lb

## 2018-05-07 DIAGNOSIS — Z113 Encounter for screening for infections with a predominantly sexual mode of transmission: Secondary | ICD-10-CM

## 2018-05-07 DIAGNOSIS — I10 Essential (primary) hypertension: Secondary | ICD-10-CM | POA: Diagnosis not present

## 2018-05-07 DIAGNOSIS — Z114 Encounter for screening for human immunodeficiency virus [HIV]: Secondary | ICD-10-CM

## 2018-05-07 NOTE — Telephone Encounter (Signed)
I prefer he schedule a visit to be seen so we can talk about his situation and discuss labs to be ordered and whether he needs treatment if possible exposure. I do not typically order any kind of lab work without an office visit.

## 2018-05-07 NOTE — Telephone Encounter (Signed)
Patient scheduled.

## 2018-05-07 NOTE — Progress Notes (Signed)
HPI: Billy Landry is a 68 y.o. male who  has a past medical history of Allergy, Essential hypertension (01/05/2015), Hypertension, and Shingles (12/2017).  he presents to [facility] today, 05/07/18,  for chief complaint of:  STD testing   . Context: new partner, no known exposures, just being safe . HTN: BP above goal, he reports has been a little higher on home monitor     At today's visit 05/07/18 ... PMH, PSH, FH reviewed and updated as needed.  Current medication list and allergy/intolerance hx reviewed and updated as needed. (See remainder of HPI, ROS, Phys Exam below)           ASSESSMENT/PLAN: The primary encounter diagnosis was Screening for STDs (sexually transmitted diseases). Diagnoses of Encounter for screening for HIV and Essential hypertension were also pertinent to this visit.   Advised will defer HSV serology since he has known cold sores. No hx genital outbreaks.   Advised that his partner should be tested for trichomonas and consider HPV test w/ Pap depending on age.    Orders Placed This Encounter  Procedures  . C. trachomatis/N. gonorrhoeae RNA  . Hepatitis B core antibody, total  . Hepatitis B surface antigen  . HIV Antibody (routine testing w rflx)  . RPR  . Hepatitis C antibody        Follow-up plan: Return for recheck as needed / when due for annual and labs around 01/2019. Recheck BP w/ nurse in 2 weeks.                                                 ################################################# ################################################# ################################################# #################################################    Current Meds  Medication Sig  . Ascorbic Acid (VITAMIN C PO) Take by mouth.  . Cholecalciferol (VITAMIN D-1000 MAX ST) 1000 units tablet Take by mouth.  . Cinnamon 500 MG capsule Take by mouth.  .  doxazosin (CARDURA) 2 MG tablet Take 2 mg by mouth daily.  Marland Kitchen L-Lysine 1000 MG TABS Take by mouth.  . loratadine (CLARITIN) 10 MG tablet Take 10 mg by mouth daily.  Marland Kitchen losartan (COZAAR) 50 MG tablet Take 1 tablet (50 mg total) by mouth daily.  . Melatonin 5 MG TABS Take by mouth.  . mupirocin ointment (BACTROBAN) 2 % Apply 1 application topically 3 (three) times daily.  . valACYclovir (VALTREX) 1000 MG tablet TAKE 1/2 TAB BY MOUTH EVERY DAY  . valACYclovir (VALTREX) 1000 MG tablet Take 1 tablet (1,000 mg total) by mouth 3 (three) times daily.  . valACYclovir (VALTREX) 1000 MG tablet Take 0.5 tablets (500 mg total) by mouth daily as needed. Due for follow up appointment.  . vitamin E 400 UNIT capsule Take 400 Units by mouth daily.    Allergies  Allergen Reactions  . Bee Venom Anaphylaxis       Review of Systems:  Constitutional: No recent illness  Musculoskeletal: No new myalgia/arthralgia  Skin: No  Rash  Neurologic: No  weakness, No  Dizziness   Exam:  BP (!) 167/94 (BP Location: Left Arm, Patient Position: Sitting, Cuff Size: Normal)   Pulse (!) 53   Temp (!) 97.5 F (36.4 C) (Oral)   Wt 181 lb (82.1 kg)   BMI 25.97 kg/m   Constitutional: VS see above. General Appearance: alert, well-developed, well-nourished, NAD  Respiratory: Normal respiratory effort.  Neurological: Normal balance/coordination. No tremor.  Skin: warm, dry, intact.   Psychiatric: Normal judgment/insight. Normal mood and affect. Oriented x3.       Visit summary with medication list and pertinent instructions was printed for patient to review, patient was advised to alert [place] if any updates are needed. All questions at time of visit were answered - patient instructed to contact office with any additional concerns. ER/RTC precautions were reviewed with the patient and understanding verbalized.   Note: Total time spent 10 minutes, greater than 50% of the visit was spent face-to-face counseling and  coordinating care for the following: The primary encounter diagnosis was Screening for STDs (sexually transmitted diseases). Diagnoses of Encounter for screening for HIV and Essential hypertension were also pertinent to this visit.Marland Kitchen  Please note: voice recognition software was used to produce this document, and typos may escape review. Please contact Dr. Sheppard Coil for any needed clarifications.    Follow up plan: Return for recheck as needed / when due for annual and labs around 01/2019. Recheck BP w/ nurse in 2 weeks.

## 2018-05-08 LAB — C. TRACHOMATIS/N. GONORRHOEAE RNA
C. TRACHOMATIS RNA, TMA: NOT DETECTED
N. GONORRHOEAE RNA, TMA: NOT DETECTED

## 2018-05-08 LAB — HIV ANTIBODY (ROUTINE TESTING W REFLEX): HIV 1&2 Ab, 4th Generation: NONREACTIVE

## 2018-05-08 LAB — HEPATITIS C ANTIBODY
Hepatitis C Ab: NONREACTIVE
SIGNAL TO CUT-OFF: 0.01 (ref <1.00)

## 2018-05-08 LAB — RPR: RPR Ser Ql: NONREACTIVE

## 2018-05-08 LAB — HEPATITIS B CORE ANTIBODY, TOTAL: Hep B Core Total Ab: NONREACTIVE

## 2018-05-08 LAB — HEPATITIS B SURFACE ANTIGEN: HEP B S AG: NONREACTIVE

## 2018-05-21 ENCOUNTER — Ambulatory Visit (INDEPENDENT_AMBULATORY_CARE_PROVIDER_SITE_OTHER): Payer: Medicare Other | Admitting: Osteopathic Medicine

## 2018-05-21 DIAGNOSIS — I10 Essential (primary) hypertension: Secondary | ICD-10-CM

## 2018-05-21 NOTE — Progress Notes (Signed)
Established Patient Office Visit  Subjective:  Patient ID: Billy Landry, male    DOB: 08-04-1950  Age: 68 y.o. MRN: [id number]  CC:  Chief Complaint  Patient presents with  . Hypertension    HPI Billy Landry presents for blood pressure check. Today in the office his first blood pressure check was 148/71 and his home monitor was 152/83. Second blood pressure check in office was within normal limits 128/62.  Past Medical History:  Diagnosis Date  . Allergy   . Essential hypertension 01/05/2015  . Hypertension   . Shingles 12/2017    Past Surgical History:  Procedure Laterality Date  . CATARACT EXTRACTION Left 2013  . RETINAL DETACHMENT SURGERY  2012  . ROTATOR CUFF REPAIR Right 2010  . TONSILECTOMY, ADENOIDECTOMY, BILATERAL MYRINGOTOMY AND TUBES  1968  . VASECTOMY      Family History  Problem Relation Age of Onset  . Cancer Mother   . Diabetes Father   . Stroke Father   . Stroke Brother     Social History   Socioeconomic History  . Marital status: Single    Spouse name: Not on file  . Number of children: 3  . Years of education: 16  . Highest education level: Bachelor's degree (e.g., BA, AB, BS)  Occupational History  . Occupation: retired    Comment: Pharmacist, hospital  . Financial resource strain: Not hard at all  . Food insecurity:    Worry: Never true    Inability: Never true  . Transportation needs:    Medical: No    Non-medical: No  Tobacco Use  . Smoking status: Never Smoker  . Smokeless tobacco: Never Used  Substance and Sexual Activity  . Alcohol use: Yes    Alcohol/week: 2.0 standard drinks    Types: 2 Cans of beer per week    Comment: just on Fridays  . Drug use: No  . Sexual activity: Yes  Lifestyle  . Physical activity:    Days per week: 0 days    Minutes per session: 0 min  . Stress: Not at all  Relationships  . Social connections:    Talks on phone: More than three times a week    Gets together: Twice a week   Attends religious service: Never    Active member of club or organization: No    Attends meetings of clubs or organizations: Never    Relationship status: Not on file  . Intimate partner violence:    Fear of current or ex partner: No    Emotionally abused: No    Physically abused: No    Forced sexual activity: No  Other Topics Concern  . Not on file  Social History Narrative   Did work for [employer]. Currently looking for a job. Caffeine 1 cup of coffee in the morning.    Outpatient Medications Prior to Visit  Medication Sig Dispense Refill  . Ascorbic Acid (VITAMIN C PO) Take by mouth.    . Cholecalciferol (VITAMIN D-1000 MAX ST) 1000 units tablet Take by mouth.    . Cinnamon 500 MG capsule Take by mouth.    . doxazosin (CARDURA) 2 MG tablet Take 2 mg by mouth daily.  5  . L-Lysine 1000 MG TABS Take by mouth.    . loratadine (CLARITIN) 10 MG tablet Take 10 mg by mouth daily.    Marland Kitchen losartan (COZAAR) 50 MG tablet Take 1 tablet (50 mg total) by mouth daily. 90 tablet 1  .  Melatonin 5 MG TABS Take by mouth.    . mupirocin ointment (BACTROBAN) 2 % Apply 1 application topically 3 (three) times daily. 22 g 0  . valACYclovir (VALTREX) 1000 MG tablet TAKE 1/2 TAB BY MOUTH EVERY DAY 45 tablet 0  . valACYclovir (VALTREX) 1000 MG tablet Take 1 tablet (1,000 mg total) by mouth 3 (three) times daily. 21 tablet 0  . valACYclovir (VALTREX) 1000 MG tablet Take 0.5 tablets (500 mg total) by mouth daily as needed. Due for follow up appointment. 45 tablet 0  . vitamin E 400 UNIT capsule Take 400 Units by mouth daily.     No facility-administered medications prior to visit.     Allergies  Allergen Reactions  . Bee Venom Anaphylaxis    ROS Review of Systems    Objective:    Physical Exam  BP (!) 184/71   Pulse 63   Wt 180 lb (81.6 kg)   SpO2 99%   BMI 25.83 kg/m  Wt Readings from Last 3 Encounters:  05/21/18 180 lb (81.6 kg)  05/07/18 181 lb (82.1 kg)  01/29/18 181 lb  (82.1 kg)     Health Maintenance Due  Topic Date Due  . PNA vac Low Risk Adult (1 of 2 - PCV13) 07/15/2014    There are no preventive care reminders to display for this patient.  Lab Results  Component Value Date   TSH 1.745 01/05/2015   Lab Results  Component Value Date   WBC 5.8 01/24/2017   HGB 13.2 01/24/2017   HCT 37.2 (L) 01/24/2017   MCV 103.3 (H) 01/24/2017   PLT 151 01/24/2017   Lab Results  Component Value Date   NA 141 01/29/2018   K 4.3 01/29/2018   CO2 29 01/29/2018   GLUCOSE 106 (H) 01/29/2018   BUN 25 01/29/2018   CREATININE 0.93 01/29/2018   BILITOT 0.9 01/29/2018   ALKPHOS 51 01/25/2016   AST 29 01/29/2018   ALT 34 01/29/2018   PROT 6.4 01/29/2018   ALBUMIN 4.3 01/25/2016   CALCIUM 9.7 01/29/2018   Lab Results  Component Value Date   CHOL 186 01/29/2018   Lab Results  Component Value Date   HDL 48 01/29/2018   Lab Results  Component Value Date   LDLCALC 119 (H) 01/29/2018   Lab Results  Component Value Date   TRIG 88 01/29/2018   Lab Results  Component Value Date   CHOLHDL 3.9 01/29/2018   Lab Results  Component Value Date   HGBA1C 5.2 01/31/2018      Assessment & Plan:  Hypertension- Blood pressure reading was within normal limits. Advised to continue current medications and follow up as instructed by PCP.    Problem List Items Addressed This Visit    None      No orders of the defined types were placed in this encounter.   Follow-up: Return in about 3 months (around 08/19/2018) for with PCP. Marland Kitchen    Lavell Luster, [place]

## 2018-05-21 NOTE — Progress Notes (Signed)
Home BP monitor accurate! Home BP readings are ok, few above goal but overall <130/80

## 2018-05-29 ENCOUNTER — Other Ambulatory Visit: Payer: Self-pay | Admitting: Osteopathic Medicine

## 2018-06-05 ENCOUNTER — Other Ambulatory Visit: Payer: Self-pay | Admitting: Family Medicine

## 2018-06-05 DIAGNOSIS — I10 Essential (primary) hypertension: Secondary | ICD-10-CM

## 2018-08-25 ENCOUNTER — Other Ambulatory Visit: Payer: Self-pay | Admitting: Osteopathic Medicine

## 2018-08-25 NOTE — Telephone Encounter (Signed)
Refill request for Valtrex.  Original Rx written for 45 tablets with no refills / Will send to provider for review

## 2018-11-14 ENCOUNTER — Other Ambulatory Visit: Payer: Self-pay | Admitting: Osteopathic Medicine

## 2019-01-07 DIAGNOSIS — L821 Other seborrheic keratosis: Secondary | ICD-10-CM | POA: Diagnosis not present

## 2019-01-07 DIAGNOSIS — Z85828 Personal history of other malignant neoplasm of skin: Secondary | ICD-10-CM | POA: Diagnosis not present

## 2019-01-07 DIAGNOSIS — L814 Other melanin hyperpigmentation: Secondary | ICD-10-CM | POA: Diagnosis not present

## 2019-01-07 DIAGNOSIS — W57XXXS Bitten or stung by nonvenomous insect and other nonvenomous arthropods, sequela: Secondary | ICD-10-CM | POA: Diagnosis not present

## 2019-01-07 DIAGNOSIS — L309 Dermatitis, unspecified: Secondary | ICD-10-CM | POA: Diagnosis not present

## 2019-01-07 DIAGNOSIS — D229 Melanocytic nevi, unspecified: Secondary | ICD-10-CM | POA: Diagnosis not present

## 2019-01-15 ENCOUNTER — Encounter: Payer: Self-pay | Admitting: Osteopathic Medicine

## 2019-01-16 ENCOUNTER — Encounter: Payer: Self-pay | Admitting: Osteopathic Medicine

## 2019-01-16 ENCOUNTER — Other Ambulatory Visit: Payer: Self-pay

## 2019-01-16 ENCOUNTER — Other Ambulatory Visit: Payer: Self-pay | Admitting: Osteopathic Medicine

## 2019-01-16 ENCOUNTER — Ambulatory Visit (INDEPENDENT_AMBULATORY_CARE_PROVIDER_SITE_OTHER): Payer: Medicare Other

## 2019-01-16 ENCOUNTER — Ambulatory Visit (INDEPENDENT_AMBULATORY_CARE_PROVIDER_SITE_OTHER): Payer: Medicare Other | Admitting: Osteopathic Medicine

## 2019-01-16 VITALS — BP 147/88 | HR 65 | Temp 98.5°F | Wt 175.0 lb

## 2019-01-16 DIAGNOSIS — I10 Essential (primary) hypertension: Secondary | ICD-10-CM | POA: Diagnosis not present

## 2019-01-16 DIAGNOSIS — M25551 Pain in right hip: Secondary | ICD-10-CM | POA: Diagnosis not present

## 2019-01-16 DIAGNOSIS — R202 Paresthesia of skin: Secondary | ICD-10-CM

## 2019-01-16 DIAGNOSIS — M533 Sacrococcygeal disorders, not elsewhere classified: Secondary | ICD-10-CM

## 2019-01-16 DIAGNOSIS — M545 Low back pain, unspecified: Secondary | ICD-10-CM

## 2019-01-16 DIAGNOSIS — D7589 Other specified diseases of blood and blood-forming organs: Secondary | ICD-10-CM | POA: Diagnosis not present

## 2019-01-16 DIAGNOSIS — R7989 Other specified abnormal findings of blood chemistry: Secondary | ICD-10-CM

## 2019-01-16 DIAGNOSIS — M160 Bilateral primary osteoarthritis of hip: Secondary | ICD-10-CM | POA: Diagnosis not present

## 2019-01-16 DIAGNOSIS — M67911 Unspecified disorder of synovium and tendon, right shoulder: Secondary | ICD-10-CM

## 2019-01-16 DIAGNOSIS — M25552 Pain in left hip: Secondary | ICD-10-CM | POA: Diagnosis not present

## 2019-01-16 DIAGNOSIS — M19011 Primary osteoarthritis, right shoulder: Secondary | ICD-10-CM | POA: Diagnosis not present

## 2019-01-16 DIAGNOSIS — G2581 Restless legs syndrome: Secondary | ICD-10-CM

## 2019-01-16 DIAGNOSIS — Z125 Encounter for screening for malignant neoplasm of prostate: Secondary | ICD-10-CM

## 2019-01-16 DIAGNOSIS — G8929 Other chronic pain: Secondary | ICD-10-CM | POA: Diagnosis not present

## 2019-01-16 MED ORDER — HYDROCHLOROTHIAZIDE 12.5 MG PO TABS: 12.5000 mg | ORAL_TABLET | Freq: Every day | ORAL | 0 refills | Status: DC

## 2019-01-16 MED ORDER — GABAPENTIN 100 MG PO CAPS: 100.0000 mg | ORAL_CAPSULE | Freq: Every day | ORAL | 1 refills | Status: DC

## 2019-01-16 MED ORDER — MELOXICAM 15 MG PO TABS: 15.0000 mg | ORAL_TABLET | Freq: Every day | ORAL | 1 refills | Status: DC

## 2019-01-16 NOTE — Progress Notes (Signed)
HPI: Billy Landry is a 68 y.o. male who  has a past medical history of Allergy, Essential hypertension (01/05/2015), Hypertension, and Shingles (12/2017).  he presents to [facility] today, 01/16/19,  for chief complaint of:  Lower back/hip pain, shoulder pain HTN, BP increasing past few months   BP Keeps carefiul track of BP at home, has been on average increasing over the past few months.   Lower back pain Lower back like SI joints bilaterally and into the gluteal area. Associated w/ leg pain, feeling restless legs especially at night, constantly tossing and turning.   Shoulder pain History of rotator cuff repair on the right, shoulder has been sore/clicking feeling. No new injury       At today's visit 01/16/19 ... PMH, PSH, FH reviewed and updated as needed.  Current medication list and allergy/intolerance hx reviewed and updated as needed. (See remainder of HPI, ROS, Phys Exam below)   No results found.  No results found for this or any previous visit (from the past 72 hour(s)).        ASSESSMENT/PLAN: The primary encounter diagnosis was Essential hypertension. Diagnoses of Dysfunction of right rotator cuff, Left hip pain, Chronic bilateral low back pain without sciatica, Disorder of SI (sacroiliac) joint, Restless leg syndrome, Prostate cancer screening, and Paresthesias were also pertinent to this visit.  Suspect SI joint problem possibly bilaterally and hip issue likely on the left, supraspinatus on R shoulder. Would very likely benefit from sports consult, will get Xrays and trial NSAID Escalate BP meds Trial Gabapentin for what sounds like RLS   Orders Placed This Encounter  Procedures  . DG Si Joints  . DG Shoulder Right  . DG HIPS BILAT WITH PELVIS 2V  . CBC  . Fe+TIBC+Fer  . COMPLETE METABOLIC PANEL WITH GFR  . Vitamin B12  . PSA, Total with Reflex to PSA, Free  . Lipid panel     Meds ordered this encounter   Medications  . gabapentin (NEURONTIN) 100 MG capsule    Sig: Take 1-3 capsules (100-300 mg total) by mouth at bedtime.    Dispense:  90 capsule    Refill:  1  . hydrochlorothiazide (HYDRODIURIL) 12.5 MG tablet    Sig: Take 1 tablet (12.5 mg total) by mouth daily.    Dispense:  30 tablet    Refill:  0  . meloxicam (MOBIC) 15 MG tablet    Sig: Take 1 tablet (15 mg total) by mouth daily.    Dispense:  30 tablet    Refill:  1    Patient Instructions  Plan:  Will add hydrochlorothiazide 12.5 mg to the losartan the milligrams that you are already taking.  We will plan to follow-up on blood pressure in a week or so.  I sent gabapentin to try for restless leg/sleep.  This may also help pain issues.  I sent meloxicam anti-inflammatory to take daily as needed for aches/pains.  We will get labs to also evaluate for potential causes of restless leg, you are also due for blood work anyway!  Let us get some x-rays of the lower back/SI joint and hips, as well as the shoulder.  Depending on how the x-rays are looking and how you are feeling with the medications as above, we may consider follow-up with sports medicine         Follow-up plan: Return in about 1 week (around 01/23/2019) for BP recheck, pain recheck, review labs .                                                 ################################################# ################################################# ################################################# #################################################  Current Meds  Medication Sig  . Ascorbic Acid (VITAMIN C PO) Take by mouth.  . Cholecalciferol (VITAMIN D-1000 MAX ST) 1000 units tablet Take by mouth.  . Cinnamon 500 MG capsule Take by mouth.  . doxazosin (CARDURA) 2 MG tablet Take 2 mg by mouth daily.  Marland Kitchen L-Lysine 1000 MG TABS Take by mouth.  . loratadine (CLARITIN) 10 MG tablet Take 10 mg by mouth daily.  Marland Kitchen  losartan (COZAAR) 100 MG tablet TAKE 1/2 TAB BY MOUTH EVERY DAY  . Melatonin 5 MG TABS Take by mouth.  . mupirocin ointment (BACTROBAN) 2 % Apply 1 application topically 3 (three) times daily.  . valACYclovir (VALTREX) 1000 MG tablet TAKE 0.5 TABLETS (500 MG TOTAL) BY MOUTH DAILY AS NEEDED. DUE FOR FOLLOW UP APPOINTMENT.  . valACYclovir (VALTREX) 1000 MG tablet TAKE 1/2 TABLET BY MOUTH DAILY AS NEEDED  . vitamin E 400 UNIT capsule Take 400 Units by mouth daily.    Allergies  Allergen Reactions  . Bee Venom Anaphylaxis       Review of Systems:  Constitutional: No recent illness  HEENT: No  headache, no vision change  Cardiac: No  chest pain, No  pressure, No palpitations  Respiratory:  No  shortness of breath. No  Cough  Gastrointestinal: No  abdominal pain  Musculoskeletal: +new myalgia/arthralgia  Skin: No  Rash  Neurologic: No  weakness, No  Dizziness   Exam:  BP (!) 147/88 (BP Location: Left Arm, Patient Position: Sitting, Cuff Size: Normal)   Pulse 65   Temp 98.5 F (36.9 C) (Oral)   Wt 175 lb 0.6 oz (79.4 kg)   BMI 25.12 kg/m   Constitutional: VS see above. General Appearance: alert, well-developed, well-nourished, NAD  Eyes: Normal lids and conjunctive, non-icteric sclera  Ears, Nose, Mouth, Throat: MMM, Normal external inspection ears/nares/mouth/lips/gums.  Neck: No masses, trachea midline.   Respiratory: Normal respiratory effort. no wheeze, no rhonchi, no rales  Cardiovascular: S1/S2 normal, no murmur, no rub/gallop auscultated. RRR.   Musculoskeletal: Gait normal. Symmetric and independent movement of all extremities  R shoulder nontender, sore w/ apprehension test and empty can   No midline spinal tenderness, neg SLR bilaterally, (+)FABER on L hip   Neurological: Normal balance/coordination. No tremor.  Skin: warm, dry, intact.   Psychiatric: Normal judgment/insight. Normal mood and affect. Oriented x3.       Visit summary with  medication list and pertinent instructions was printed for patient to review, patient was advised to alert [place] if any updates are needed. All questions at time of visit were answered - patient instructed to contact office with any additional concerns. ER/RTC precautions were reviewed with the patient and understanding verbalized.   Note: Total time spent 25 minutes, greater than 50% of the visit was spent face-to-face counseling and coordinating care for the following: The primary encounter diagnosis was Essential hypertension. Diagnoses of Dysfunction of right rotator cuff, Left hip pain, Chronic bilateral low back pain without sciatica, Disorder of SI (sacroiliac) joint, Restless leg syndrome, Prostate cancer screening, and Paresthesias were also pertinent to this visit.Marland Kitchen  Please note: voice recognition software was used to produce this document, and typos may escape review. Please contact Dr. Sheppard Coil for any needed clarifications.    Follow up plan: Return in about 1 week (around 01/23/2019) for BP recheck, pain recheck, review labs .

## 2019-01-16 NOTE — Patient Instructions (Addendum)
Plan:  Will add hydrochlorothiazide 12.5 mg to the losartan the milligrams that you are already taking.  We will plan to follow-up on blood pressure in a week or so.  I sent gabapentin to try for restless leg/sleep.  This may also help pain issues.  I sent meloxicam anti-inflammatory to take daily as needed for aches/pains.  We will get labs to also evaluate for potential causes of restless leg, you are also due for blood work anyway!  Let us get some x-rays of the lower back/SI joint and hips, as well as the shoulder.  Depending on how the x-rays are looking and how you are feeling with the medications as above, we may consider follow-up with sports medicine

## 2019-01-17 LAB — COMPLETE METABOLIC PANEL WITH GFR
AG Ratio: 2 (calc) (ref 1.0–2.5)
ALT: 42 U/L (ref 9–46)
AST: 41 U/L — ABNORMAL HIGH (ref 10–35)
Albumin: 4.2 g/dL (ref 3.6–5.1)
Alkaline phosphatase (APISO): 60 U/L (ref 35–144)
BUN: 19 mg/dL (ref 7–25)
CO2: 25 mmol/L (ref 20–32)
Calcium: 9.5 mg/dL (ref 8.6–10.3)
Chloride: 106 mmol/L (ref 98–110)
Creat: 0.93 mg/dL (ref 0.70–1.25)
GFR, Est African American: 97 mL/min/{1.73_m2} (ref >=60)
GFR, Est Non African American: 83 mL/min/{1.73_m2} (ref >=60)
Globulin: 2.1 g/dL (calc) (ref 1.9–3.7)
Glucose, Bld: 98 mg/dL (ref 65–99)
Potassium: 4.4 mmol/L (ref 3.5–5.3)
Sodium: 140 mmol/L (ref 135–146)
Total Bilirubin: 0.5 mg/dL (ref 0.2–1.2)
Total Protein: 6.3 g/dL (ref 6.1–8.1)

## 2019-01-17 LAB — LIPID PANEL
Cholesterol: 161 mg/dL (ref <200)
HDL: 46 mg/dL (ref >=40)
LDL Cholesterol (Calc): 97 mg/dL (calc)
Non-HDL Cholesterol (Calc): 115 mg/dL (calc) (ref <130)
Total CHOL/HDL Ratio: 3.5 (calc) (ref <5.0)
Triglycerides: 88 mg/dL (ref <150)

## 2019-01-17 LAB — IRON,TIBC AND FERRITIN PANEL
%SAT: 72 % (calc) — ABNORMAL HIGH (ref 20–48)
Ferritin: 1608 ng/mL — ABNORMAL HIGH (ref 24–380)
Iron: 144 ug/dL (ref 50–180)
TIBC: 201 mcg/dL (calc) — ABNORMAL LOW (ref 250–425)

## 2019-01-17 LAB — PSA, TOTAL WITH REFLEX TO PSA, FREE: PSA, Total: 1.1 ng/mL (ref <=4.0)

## 2019-01-17 LAB — CBC
HCT: 37.3 % — ABNORMAL LOW (ref 38.5–50.0)
Hemoglobin: 12.7 g/dL — ABNORMAL LOW (ref 13.2–17.1)
MCH: 36 pg — ABNORMAL HIGH (ref 27.0–33.0)
MCHC: 34 g/dL (ref 32.0–36.0)
MCV: 105.7 fL — ABNORMAL HIGH (ref 80.0–100.0)
MPV: 13.2 fL — ABNORMAL HIGH (ref 7.5–12.5)
Platelets: 121 10*3/uL — ABNORMAL LOW (ref 140–400)
RBC: 3.53 10*6/uL — ABNORMAL LOW (ref 4.20–5.80)
RDW: 12.2 % (ref 11.0–15.0)
WBC: 4.5 10*3/uL (ref 3.8–10.8)

## 2019-01-18 ENCOUNTER — Encounter: Payer: Self-pay | Admitting: Osteopathic Medicine

## 2019-01-18 NOTE — Telephone Encounter (Signed)
Can we maybe send this to patient engagement center as well we cannot find an answer for this patient?  Thank you!

## 2019-01-18 NOTE — Addendum Note (Signed)
Addended by: Maryla Morrow on: 01/18/2019 01:53 PM   Modules accepted: Orders

## 2019-01-19 ENCOUNTER — Encounter: Payer: Self-pay | Admitting: Osteopathic Medicine

## 2019-01-19 DIAGNOSIS — Z20828 Contact with and (suspected) exposure to other viral communicable diseases: Secondary | ICD-10-CM | POA: Diagnosis not present

## 2019-01-19 DIAGNOSIS — U071 COVID-19: Secondary | ICD-10-CM | POA: Diagnosis not present

## 2019-01-21 ENCOUNTER — Encounter: Payer: Self-pay | Admitting: Osteopathic Medicine

## 2019-01-25 ENCOUNTER — Encounter: Payer: Self-pay | Admitting: Osteopathic Medicine

## 2019-01-29 NOTE — Progress Notes (Deleted)
Subjective:   Machi Vanpatten is a 68 y.o. male who presents for Medicare Annual/Subsequent preventive examination.  Review of Systems:  No ROS.  Medicare Wellness Virtual Visit.  Visual/audio telehealth visit, UTA vital signs.   See social history for additional risk factors.       Sleep patterns:    Home Safety/Smoke Alarms: Feels safe in home. Smoke alarms in place.  Living environment; Seat Belt Safety/Bike Helmet: Wears seat belt.    Male:   CCS- UTD    PSA-  UTD Lab Results  Component Value Date   PSA 0.8 01/29/2018   PSA 1.3 01/24/2017   PSA 1.5 02/29/2012        Objective:    Vitals: There were no vitals taken for this visit.  There is no height or weight on file to calculate BMI.  Advanced Directives 01/29/2018 01/05/2015  Does Patient Have a Medical Advance Directive? Yes No  Type of Advance Directive Living will -  Does patient want to make changes to medical advance directive? No - Patient declined -  Would patient like information on creating a medical advance directive? - No - patient declined information    Tobacco Social History   Tobacco Use  Smoking Status Never Smoker  Smokeless Tobacco Never Used     Counseling given: Not Answered   Clinical Intake:                       Past Medical History:  Diagnosis Date  . Allergy   . Essential hypertension 01/05/2015  . Hypertension   . Shingles 12/2017   Past Surgical History:  Procedure Laterality Date  . CATARACT EXTRACTION Left 2013  . RETINAL DETACHMENT SURGERY  2012  . ROTATOR CUFF REPAIR Right 2010  . TONSILECTOMY, ADENOIDECTOMY, BILATERAL MYRINGOTOMY AND TUBES  1968  . VASECTOMY     Family History  Problem Relation Age of Onset  . Cancer Mother   . Diabetes Father   . Stroke Father   . Stroke Brother    Social History   Socioeconomic History  . Marital status: Single    Spouse name: Not on file  . Number of children: 3  . Years of education: 16  .  Highest education level: Bachelor's degree (e.g., BA, AB, BS)  Occupational History  . Occupation: retired    Comment: Pharmacist, hospital  . Financial resource strain: Not hard at all  . Food insecurity    Worry: Never true    Inability: Never true  . Transportation needs    Medical: No    Non-medical: No  Tobacco Use  . Smoking status: Never Smoker  . Smokeless tobacco: Never Used  Substance and Sexual Activity  . Alcohol use: Yes    Alcohol/week: 2.0 standard drinks    Types: 2 Cans of beer per week    Comment: just on Fridays  . Drug use: No  . Sexual activity: Yes  Lifestyle  . Physical activity    Days per week: 0 days    Minutes per session: 0 min  . Stress: Not at all  Relationships  . Social connections    Talks on phone: More than three times a week    Gets together: Twice a week    Attends religious service: Never    Active member of club or organization: No    Attends meetings of clubs or organizations: Never    Relationship status: Not on file  Other Topics Concern  . Not on file  Social History Narrative   Did work for [employer]. Currently looking for a job. Caffeine 1 cup of coffee in the morning.    Outpatient Encounter Medications as of 01/30/2019  Medication Sig  . Ascorbic Acid (VITAMIN C PO) Take by mouth.  . Cholecalciferol (VITAMIN D-1000 MAX ST) 1000 units tablet Take by mouth.  . Cinnamon 500 MG capsule Take by mouth.  . doxazosin (CARDURA) 2 MG tablet Take 2 mg by mouth daily.  Marland Kitchen gabapentin (NEURONTIN) 100 MG capsule Take 1-3 capsules (100-300 mg total) by mouth at bedtime.  . hydrochlorothiazide (HYDRODIURIL) 12.5 MG tablet Take 1 tablet (12.5 mg total) by mouth daily.  Marland Kitchen L-Lysine 1000 MG TABS Take by mouth.  . loratadine (CLARITIN) 10 MG tablet Take 10 mg by mouth daily.  Marland Kitchen losartan (COZAAR) 100 MG tablet TAKE 1/2 TAB BY MOUTH EVERY DAY  . Melatonin 5 MG TABS Take by mouth.  . meloxicam (MOBIC) 15 MG tablet Take 1  tablet (15 mg total) by mouth daily.  . mupirocin ointment (BACTROBAN) 2 % Apply 1 application topically 3 (three) times daily.  . valACYclovir (VALTREX) 1000 MG tablet TAKE 1/2 TAB BY MOUTH EVERY DAY (Patient not taking: Reported on 01/16/2019)  . valACYclovir (VALTREX) 1000 MG tablet Take 1 tablet (1,000 mg total) by mouth 3 (three) times daily. (Patient not taking: Reported on 01/16/2019)  . valACYclovir (VALTREX) 1000 MG tablet TAKE 0.5 TABLETS (500 MG TOTAL) BY MOUTH DAILY AS NEEDED. DUE FOR FOLLOW UP APPOINTMENT.  . valACYclovir (VALTREX) 1000 MG tablet TAKE 1/2 TABLET BY MOUTH DAILY AS NEEDED  . vitamin E 400 UNIT capsule Take 400 Units by mouth daily.   No facility-administered encounter medications on file as of 01/30/2019.     Activities of Daily Living In your present state of health, do you have any difficulty performing the following activities: 01/29/2018  Hearing? N  Vision? N  Difficulty concentrating or making decisions? N  Walking or climbing stairs? N  Dressing or bathing? N  Doing errands, shopping? N  Preparing Food and eating ? N  Using the Toilet? N  In the past six months, have you accidently leaked urine? N  Comment takes Doxasosin  Do you have problems with loss of bowel control? N  Managing your Medications? N  Managing your Finances? N  Housekeeping or managing your Housekeeping? N  Some recent data might be hidden    Patient Care Team: Emeterio Reeve, DO as PCP - General (Osteopathic Medicine)   Assessment:   This is a routine wellness examination for Giankarlo.Physical assessment deferred to PCP.   Exercise Activities and Dietary recommendations   Diet  Breakfast: Lunch:  Dinner:       Goals    . Exercise 3x per week (30 min per time)     Get out and walk 3 times a week for at least 30 minutes at a time. Increase as tolerated       Fall Risk Fall Risk  01/29/2018 01/24/2017 10/24/2016  Falls in the past year? 1 No No  Comment - - Emmi  Telephone Survey: data to providers prior to load  Number falls in past yr: 0 - -  Injury with Fall? 0 - -  Follow up Falls prevention discussed - -   Is the patient's home free of loose throw rugs in walkways, pet beds, electrical cords, etc?   {Blank single:19197::"yes","no"}      Grab  bars in the bathroom? {Blank single:19197::"yes","no"}      Handrails on the stairs?   {Blank single:19197::"yes","no"}      Adequate lighting?   {Blank single:19197::"yes","no"}    Depression Screen PHQ 2/9 Scores 01/29/2018 01/24/2017  PHQ - 2 Score 0 0    Cognitive Function     6CIT Screen 01/29/2018  What Year? 0 points  What month? 0 points  What time? 0 points  Count back from 20 0 points  Months in reverse 0 points  Repeat phrase 0 points  Total Score 0    Immunization History  Administered Date(s) Administered  . Tdap 10/26/2015      Screening Tests Health Maintenance  Topic Date Due  . PNA vac Low Risk Adult (1 of 2 - PCV13) 07/15/2014  . INFLUENZA VACCINE  01/16/2020 (Originally 10/27/2018)  . COLONOSCOPY  03/29/2019  . TETANUS/TDAP  10/25/2025  . Hepatitis C Screening  Completed        Plan:   ***  I have personally reviewed and noted the following in the patient's chart:   . Medical and social history . Use of alcohol, tobacco or illicit drugs  . Current medications and supplements . Functional ability and status . Nutritional status . Physical activity . Advanced directives . List of other physicians . Hospitalizations, surgeries, and ER visits in previous 12 months . Vitals . Screenings to include cognitive, depression, and falls . Referrals and appointments  In addition, I have reviewed and discussed with patient certain preventive protocols, quality metrics, and best practice recommendations. A written personalized care plan for preventive services as well as general preventive health recommendations were provided to patient.     Joanne Chars,  LPN  [id number]

## 2019-01-30 ENCOUNTER — Encounter: Payer: Self-pay | Admitting: Osteopathic Medicine

## 2019-01-30 ENCOUNTER — Ambulatory Visit: Payer: Medicare Other

## 2019-01-30 ENCOUNTER — Ambulatory Visit (INDEPENDENT_AMBULATORY_CARE_PROVIDER_SITE_OTHER): Payer: Medicare Other | Admitting: Osteopathic Medicine

## 2019-01-30 VITALS — BP 163/101 | HR 67 | Temp 97.7°F | Wt 172.4 lb

## 2019-01-30 DIAGNOSIS — D7589 Other specified diseases of blood and blood-forming organs: Secondary | ICD-10-CM | POA: Diagnosis not present

## 2019-01-30 DIAGNOSIS — G8929 Other chronic pain: Secondary | ICD-10-CM | POA: Diagnosis not present

## 2019-01-30 DIAGNOSIS — I10 Essential (primary) hypertension: Secondary | ICD-10-CM

## 2019-01-30 DIAGNOSIS — R7989 Other specified abnormal findings of blood chemistry: Secondary | ICD-10-CM | POA: Diagnosis not present

## 2019-01-30 DIAGNOSIS — M25519 Pain in unspecified shoulder: Secondary | ICD-10-CM | POA: Diagnosis not present

## 2019-01-30 NOTE — Progress Notes (Signed)
Virtual Visit via Video (App used: Doximity) Note  I connected with      Boris Sharper on 01/30/19 at 9:24 AM by a telemedicine application and verified that I am speaking with the correct person using two identifiers.  Patient is at home I am working from home    I discussed the limitations of evaluation and management by telemedicine and the availability of in person appointments. The patient expressed understanding and agreed to proceed.  History of Present Illness: Billy Landry is a 68 y.o. male who would like to discuss BP recheck, RLS recheck  HTN: Last visit 01/16/19: We added HCT 12.5 mg dialy  to Losartan 100 mg daily. Overall, BP 143/85 Sun, 129/74 Mon, yesterday 140/89, has 98/68 last week.  BP Readings from Last 3 Encounters:  01/30/19 (!) 163/101 on recheck, up from 148/95 10 mins before   01/16/19 (!) 147/88  05/21/18 128/62    RLS: Gabapentin is working well for the restless legs, he's taking 200 mg and sleeping a lot better.    Shoulder pain is still a problem   COVID(+) and still no symptoms, wife was sick and was (+), she's feeling better except a residual cough.       Observations/Objective: BP (!) 163/101 (BP Location: Left Arm, Patient Position: Sitting, Cuff Size: Normal)   Pulse 67   Temp 97.7 F (36.5 C) (Oral)   Wt 172 lb 6.4 oz (78.2 kg)   BMI 24.74 kg/m  BP Readings from Last 3 Encounters:  01/30/19 (!) 163/101  01/16/19 (!) 147/88  05/21/18 128/62   Exam: Normal Speech.  NAD  Lab and Radiology Results No results found for this or any previous visit (from the past 72 hour(s)). No results found.     Assessment and Plan: 68 y.o. male with The primary encounter diagnosis was Essential hypertension. Diagnoses of Macrocytosis, Elevated ferritin, and Chronic shoulder pain, unspecified laterality were also pertinent to this visit.   PDMP not reviewed this encounter. No orders of the defined types were placed in this  encounter.  No orders of the defined types were placed in this encounter.  Patient Instructions  Plan:  Blood Pressure: Increase the hydrochlorothiazide (HCTZ) from 12.5 mg to 25 mg daily Continue the Losartan Will recheck when yo usee Dr. Darene Lamer in a week or two. If at goal, nothing else to do! If still above goal, may adjust medicines, but I'm hoping this dose increase will get [place] where we need to be.   Shoulder: I'll have the font desk call you to schedule a follow-up with Dr. Darene Lamer (sports medicine) in the next week or two. He'd be the best one to go over the Xrays with you, decide if this is something that can be treated w/ injection or physical therapy or other treatment, or if we need to pursue an MRI and/or an orthopedic surgery referral.   Labs: Please get blood work done end of this week or early next week - orders are in, can get labs drawn anytime, doesn't have to be fasting.   Follow-up: based on BP in office w/ Dr T, and based on labs! / call or message me if needed!     Instructions sent via MyChart. If MyChart not available, pt was given option for info via personal e-mail w/ no guarantee of protected health info over unsecured e-mail communication, and MyChart sign-up instructions were included.   Follow Up Instructions: Return for 1-2 weeks for shoulder pain w/ Dr  T .    I discussed the assessment and treatment plan with the patient. The patient was provided an opportunity to ask questions and all were answered. The patient agreed with the plan and demonstrated an understanding of the instructions.   The patient was advised to call back or seek an in-person evaluation if any new concerns, if symptoms worsen or if the condition fails to improve as anticipated.  25 minutes of non-face-to-face time was provided during this encounter.                      Historical information moved to improve visibility of documentation.  Past Medical History:  Diagnosis  Date  . Allergy   . Essential hypertension 01/05/2015  . Hypertension   . Shingles 12/2017   Past Surgical History:  Procedure Laterality Date  . CATARACT EXTRACTION Left 2013  . RETINAL DETACHMENT SURGERY  2012  . ROTATOR CUFF REPAIR Right 2010  . TONSILECTOMY, ADENOIDECTOMY, BILATERAL MYRINGOTOMY AND TUBES  1968  . VASECTOMY     Social History   Tobacco Use  . Smoking status: Never Smoker  . Smokeless tobacco: Never Used  Substance Use Topics  . Alcohol use: Yes    Alcohol/week: 2.0 standard drinks    Types: 2 Cans of beer per week    Comment: just on Fridays   family history includes Cancer in his mother; Diabetes in his father; Stroke in his brother and father.  Medications: Current Outpatient Medications  Medication Sig Dispense Refill  . Ascorbic Acid (VITAMIN C PO) Take by mouth.    . Cholecalciferol (VITAMIN D-1000 MAX ST) 1000 units tablet Take by mouth.    . Cinnamon 500 MG capsule Take by mouth.    . doxazosin (CARDURA) 2 MG tablet Take 2 mg by mouth daily.  5  . gabapentin (NEURONTIN) 100 MG capsule Take 1-3 capsules (100-300 mg total) by mouth at bedtime. 90 capsule 1  . hydrochlorothiazide (HYDRODIURIL) 12.5 MG tablet Take 1 tablet (12.5 mg total) by mouth daily. 30 tablet 0  . L-Lysine 1000 MG TABS Take by mouth.    . loratadine (CLARITIN) 10 MG tablet Take 10 mg by mouth daily.    Marland Kitchen losartan (COZAAR) 100 MG tablet TAKE 1/2 TAB BY MOUTH EVERY DAY 45 tablet 3  . Melatonin 5 MG TABS Take by mouth.    . meloxicam (MOBIC) 15 MG tablet Take 1 tablet (15 mg total) by mouth daily. 30 tablet 1  . mupirocin ointment (BACTROBAN) 2 % Apply 1 application topically 3 (three) times daily. 22 g 0  . valACYclovir (VALTREX) 1000 MG tablet TAKE 1/2 TABLET BY MOUTH DAILY AS NEEDED 45 tablet 0  . vitamin E 400 UNIT capsule Take 400 Units by mouth daily.     No current facility-administered medications for this visit.    Allergies  Allergen Reactions  . Bee Venom  Anaphylaxis    PDMP not reviewed this encounter. No orders of the defined types were placed in this encounter.  No orders of the defined types were placed in this encounter.

## 2019-01-30 NOTE — Patient Instructions (Signed)
Plan:  Blood Pressure: Increase the hydrochlorothiazide (HCTZ) from 12.5 mg to 25 mg daily Continue the Losartan Will recheck when yo usee Dr. Darene Lamer in a week or two. If at goal, nothing else to do! If still above goal, may adjust medicines, but I'm hoping this dose increase will get [place] where we need to be.   Shoulder: I'll have the font desk call you to schedule a follow-up with Dr. Darene Lamer (sports medicine) in the next week or two. He'd be the best one to go over the Xrays with you, decide if this is something that can be treated w/ injection or physical therapy or other treatment, or if we need to pursue an MRI and/or an orthopedic surgery referral.   Labs: Please get blood work done end of this week or early next week - orders are in, can get labs drawn anytime, doesn't have to be fasting.   Follow-up: based on BP in office w/ Dr T, and based on labs! / call or message me if needed!

## 2019-02-01 DIAGNOSIS — R7989 Other specified abnormal findings of blood chemistry: Secondary | ICD-10-CM | POA: Diagnosis not present

## 2019-02-01 DIAGNOSIS — D7589 Other specified diseases of blood and blood-forming organs: Secondary | ICD-10-CM | POA: Diagnosis not present

## 2019-02-02 LAB — B12 AND FOLATE PANEL
Folate: 10.2 ng/mL
Vitamin B-12: 453 pg/mL (ref 200–1100)

## 2019-02-06 ENCOUNTER — Other Ambulatory Visit: Payer: Self-pay

## 2019-02-06 MED ORDER — HYDROCHLOROTHIAZIDE 12.5 MG PO TABS: 12.5000 mg | ORAL_TABLET | Freq: Every day | ORAL | 3 refills | Status: DC

## 2019-02-07 ENCOUNTER — Encounter: Payer: Self-pay | Admitting: Osteopathic Medicine

## 2019-02-07 MED ORDER — HYDROCHLOROTHIAZIDE 25 MG PO TABS: 25.0000 mg | ORAL_TABLET | Freq: Every day | ORAL | 3 refills | Status: DC

## 2019-02-07 NOTE — Telephone Encounter (Signed)
It looks like a prescription for the 12.5 mg was sent yesterday.. was this supposed to be 25 mg?  I have pended RX for 25 mg dose.. please advise

## 2019-02-08 ENCOUNTER — Other Ambulatory Visit: Payer: Self-pay

## 2019-02-08 DIAGNOSIS — H43393 Other vitreous opacities, bilateral: Secondary | ICD-10-CM | POA: Diagnosis not present

## 2019-02-08 DIAGNOSIS — H33002 Unspecified retinal detachment with retinal break, left eye: Secondary | ICD-10-CM | POA: Diagnosis not present

## 2019-02-08 DIAGNOSIS — H02839 Dermatochalasis of unspecified eye, unspecified eyelid: Secondary | ICD-10-CM | POA: Diagnosis not present

## 2019-02-08 DIAGNOSIS — H2511 Age-related nuclear cataract, right eye: Secondary | ICD-10-CM | POA: Diagnosis not present

## 2019-02-08 DIAGNOSIS — H5203 Hypermetropia, bilateral: Secondary | ICD-10-CM | POA: Diagnosis not present

## 2019-02-08 DIAGNOSIS — Z961 Presence of intraocular lens: Secondary | ICD-10-CM | POA: Diagnosis not present

## 2019-02-08 DIAGNOSIS — H04123 Dry eye syndrome of bilateral lacrimal glands: Secondary | ICD-10-CM | POA: Diagnosis not present

## 2019-02-08 MED ORDER — VALACYCLOVIR HCL 1 G PO TABS: ORAL_TABLET | ORAL | 0 refills | Status: DC

## 2019-02-13 ENCOUNTER — Ambulatory Visit (INDEPENDENT_AMBULATORY_CARE_PROVIDER_SITE_OTHER): Payer: Medicare Other | Admitting: Sports Medicine

## 2019-02-13 ENCOUNTER — Encounter: Payer: Self-pay | Admitting: Sports Medicine

## 2019-02-13 ENCOUNTER — Other Ambulatory Visit: Payer: Self-pay

## 2019-02-13 DIAGNOSIS — M12811 Other specific arthropathies, not elsewhere classified, right shoulder: Secondary | ICD-10-CM | POA: Insufficient documentation

## 2019-02-13 DIAGNOSIS — M75101 Unspecified rotator cuff tear or rupture of right shoulder, not specified as traumatic: Secondary | ICD-10-CM

## 2019-02-13 NOTE — Progress Notes (Signed)
Subjective:    CC: Right shoulder pain  HPI: Billy Landry is a very pleasant 68 year old male, he has a history of a rotator cuff tear repaired back about 10 years ago, since then he has had a slow recurrence of pain in his shoulder, posterior joint line, worse with abduction, external rotation.  He has tried ibuprofen 800 mg up to 4 times a day without any improvement.  Symptoms are moderate, persistent, localized without radiation.  I reviewed the past medical history, family history, social history, surgical history, and allergies today and no changes were needed.  Please see the problem list section below in epic for further details.  Past Medical History: Past Medical History:  Diagnosis Date  . Allergy   . Essential hypertension 01/05/2015  . Hypertension   . Shingles 12/2017   Past Surgical History: Past Surgical History:  Procedure Laterality Date  . CATARACT EXTRACTION Left 2013  . RETINAL DETACHMENT SURGERY  2012  . ROTATOR CUFF REPAIR Right 2010  . TONSILECTOMY, ADENOIDECTOMY, BILATERAL MYRINGOTOMY AND TUBES  1968  . VASECTOMY     Social History: Social History   Socioeconomic History  . Marital status: Single    Spouse name: Not on file  . Number of children: 3  . Years of education: 16  . Highest education level: Bachelor's degree (e.g., BA, AB, BS)  Occupational History  . Occupation: retired    Comment: Pharmacist, hospital  . Financial resource strain: Not hard at all  . Food insecurity    Worry: Never true    Inability: Never true  . Transportation needs    Medical: No    Non-medical: No  Tobacco Use  . Smoking status: Never Smoker  . Smokeless tobacco: Never Used  Substance and Sexual Activity  . Alcohol use: Yes    Alcohol/week: 2.0 standard drinks    Types: 2 Cans of beer per week    Comment: just on Fridays  . Drug use: No  . Sexual activity: Yes  Lifestyle  . Physical activity    Days per week: 0 days    Minutes per session: 0 min  .  Stress: Not at all  Relationships  . Social connections    Talks on phone: More than three times a week    Gets together: Twice a week    Attends religious service: Never    Active member of club or organization: No    Attends meetings of clubs or organizations: Never    Relationship status: Not on file  Other Topics Concern  . Not on file  Social History Narrative   Did work for [employer]. Currently looking for a job. Caffeine 1 cup of coffee in the morning.   Family History: Family History  Problem Relation Age of Onset  . Cancer Mother   . Diabetes Father   . Stroke Father   . Stroke Brother    Allergies: Allergies  Allergen Reactions  . Bee Venom Anaphylaxis   Medications: See med rec.  Review of Systems: No fevers, chills, night sweats, weight loss, chest pain, or shortness of breath.   Objective:    General: Well Developed, well nourished, and in no acute distress.  Neuro: Alert and oriented x3, extra-ocular muscles intact, sensation grossly intact.  HEENT: Normocephalic, atraumatic, pupils equal round reactive to light, neck supple, no masses, no lymphadenopathy, thyroid nonpalpable.  Skin: Warm and dry, no rashes. Cardiac: Regular rate and rhythm, no murmurs rubs or gallops, no lower extremity  edema.  Respiratory: Clear to auscultation bilaterally. Not using accessory muscles, speaking in full sentences. Right shoulder: Inspection reveals no abnormalities, atrophy or asymmetry. Palpation is normal with no tenderness over AC joint or bicipital groove. External rotation to 5 degrees with pain Rotator cuff strength normal throughout. No signs of impingement with negative Neer and Hawkin's tests, empty can. Speeds and Yergason's tests normal. No labral pathology noted with negative Obrien's, negative crank, negative clunk, and good stability. Normal scapular function observed. No painful arc and no drop arm sign. No apprehension sign  Procedure:  Real-time Ultrasound Guided injection of the right glenohumeral joint Device: Samsung HS60  Verbal informed consent obtained.  Time-out conducted.  Noted no overlying erythema, induration, or other signs of local infection.  Skin prepped in a sterile fashion.  Local anesthesia: Topical Ethyl chloride.  With sterile technique and under real time ultrasound guidance: 1 cc Kenalog 40, 2 cc lidocaine, 2 cc bupivacaine injected easily Completed without difficulty  Pain immediately resolved suggesting accurate placement of the medication.  Advised to call if fevers/chills, erythema, induration, drainage, or persistent bleeding.  Images permanently stored and available for review in the ultrasound unit.  Impression: Technically successful ultrasound guided injection.  X-rays reviewed show a high riding humeral head as can be seen in rotator cuff tear, there is also significant glenohumeral osteoarthritis.  Impression and Recommendations:    Rotator cuff tear arthropathy, right Glenohumeral joint injection today. If insufficient relief we will proceed with MRI and referral for shoulder arthroplasty.   ___________________________________________ Gwen Her. Dianah Field, M.D., ABFM., CAQSM. Primary Care and Sports Medicine [facility]  Adjunct Professor of [employer]

## 2019-02-13 NOTE — Assessment & Plan Note (Signed)
Glenohumeral joint injection today. If insufficient relief we will proceed with MRI and referral for shoulder arthroplasty.

## 2019-02-20 ENCOUNTER — Encounter: Payer: Self-pay | Admitting: Osteopathic Medicine

## 2019-02-20 ENCOUNTER — Other Ambulatory Visit: Payer: Self-pay

## 2019-03-11 ENCOUNTER — Other Ambulatory Visit: Payer: Self-pay

## 2019-03-11 ENCOUNTER — Ambulatory Visit (INDEPENDENT_AMBULATORY_CARE_PROVIDER_SITE_OTHER): Payer: Medicare Other | Admitting: Sports Medicine

## 2019-03-11 ENCOUNTER — Encounter: Payer: Self-pay | Admitting: Sports Medicine

## 2019-03-11 DIAGNOSIS — M75101 Unspecified rotator cuff tear or rupture of right shoulder, not specified as traumatic: Secondary | ICD-10-CM | POA: Diagnosis not present

## 2019-03-11 DIAGNOSIS — M12811 Other specific arthropathies, not elsewhere classified, right shoulder: Secondary | ICD-10-CM | POA: Diagnosis not present

## 2019-03-11 MED ORDER — GABAPENTIN 100 MG PO CAPS: 100.0000 mg | ORAL_CAPSULE | Freq: Every day | ORAL | 0 refills | Status: DC

## 2019-03-11 MED ORDER — TRAMADOL HCL 50 MG PO TABS: 50.0000 mg | ORAL_TABLET | Freq: Three times a day (TID) | ORAL | 0 refills | Status: DC | PRN

## 2019-03-11 NOTE — Assessment & Plan Note (Signed)
Unfortunately he did not respond sufficiently well to glenohumeral injection with guidance, continue meloxicam, he does take a bit of gabapentin at bedtime, adding some tramadol for breakthrough pain. Proceeding with shoulder CT for surgical planning, and I would like a referral to Dr. Griffin Basil to discuss shoulder arthroplasty.

## 2019-03-11 NOTE — Progress Notes (Signed)
Subjective:    CC: Follow-up  HPI: Billy Landry is a pleasant 68 year old male, he has known right end-stage glenohumeral osteoarthritis, we did an injection at the last visit, meloxicam, he has had some gabapentin he takes for restless legs, he had partial improvement but insufficiently so.  Pain is severe, persistent, localized at the joint line without radiation, worse with most motions and fears with his activities of daily living.  I reviewed the past medical history, family history, social history, surgical history, and allergies today and no changes were needed.  Please see the problem list section below in epic for further details.  Past Medical History: Past Medical History:  Diagnosis Date  . Allergy   . Essential hypertension 01/05/2015  . Hypertension   . Shingles 12/2017   Past Surgical History: Past Surgical History:  Procedure Laterality Date  . CATARACT EXTRACTION Left 2013  . RETINAL DETACHMENT SURGERY  2012  . ROTATOR CUFF REPAIR Right 2010  . TONSILECTOMY, ADENOIDECTOMY, BILATERAL MYRINGOTOMY AND TUBES  1968  . VASECTOMY     Social History: Social History   Socioeconomic History  . Marital status: Single    Spouse name: Not on file  . Number of children: 3  . Years of education: 16  . Highest education level: Bachelor's degree (e.g., BA, AB, BS)  Occupational History  . Occupation: retired    Comment: communications  Tobacco Use  . Smoking status: Never Smoker  . Smokeless tobacco: Never Used  Substance and Sexual Activity  . Alcohol use: Yes    Alcohol/week: 2.0 standard drinks    Types: 2 Cans of beer per week    Comment: just on Fridays  . Drug use: No  . Sexual activity: Yes  Other Topics Concern  . Not on file  Social History Narrative   Did work for [employer]. Currently looking for a job. Caffeine 1 cup of coffee in the morning.   Social Determinants of Health   Financial Resource Strain:   . Difficulty of Paying Living Expenses:  Not on file  Food Insecurity:   . Worried About Charity fundraiser in the Last Year: Not on file  . Ran Out of Food in the Last Year: Not on file  Transportation Needs:   . Lack of Transportation (Medical): Not on file  . Lack of Transportation (Non-Medical): Not on file  Physical Activity:   . Days of Exercise per Week: Not on file  . Minutes of Exercise per Session: Not on file  Stress:   . Feeling of Stress : Not on file  Social Connections:   . Frequency of Communication with Friends and Family: Not on file  . Frequency of Social Gatherings with Friends and Family: Not on file  . Attends Religious Services: Not on file  . Active Member of Clubs or Organizations: Not on file  . Attends Archivist Meetings: Not on file  . Marital Status: Not on file   Family History: Family History  Problem Relation Age of Onset  . Cancer Mother   . Diabetes Father   . Stroke Father   . Stroke Brother    Allergies: Allergies  Allergen Reactions  . Bee Venom Anaphylaxis   Medications: See med rec.  Review of Systems: No fevers, chills, night sweats, weight loss, chest pain, or shortness of breath.   Objective:    General: Well Developed, well nourished, and in no acute distress.  Neuro: Alert and oriented x3, extra-ocular muscles intact, sensation  grossly intact.  HEENT: Normocephalic, atraumatic, pupils equal round reactive to light, neck supple, no masses, no lymphadenopathy, thyroid nonpalpable.  Skin: Warm and dry, no rashes. Cardiac: Regular rate and rhythm, no murmurs rubs or gallops, no lower extremity edema.  Respiratory: Clear to auscultation bilaterally. Not using accessory muscles, speaking in full sentences.  Impression and Recommendations:    Rotator cuff tear arthropathy, right Unfortunately he did not respond sufficiently well to glenohumeral injection with guidance, continue meloxicam, he does take a bit of gabapentin at bedtime, adding some tramadol for  breakthrough pain. Proceeding with shoulder CT for surgical planning, and I would like a referral to Dr. Griffin Basil to discuss shoulder arthroplasty.   ___________________________________________ Gwen Her. Dianah Field, M.D., ABFM., CAQSM. Primary Care and Sports Medicine [facility]  Adjunct Professor of [place] [employer] [facility] [employer]

## 2019-03-19 ENCOUNTER — Other Ambulatory Visit: Payer: Self-pay

## 2019-03-19 MED ORDER — MELOXICAM 15 MG PO TABS: 15.0000 mg | ORAL_TABLET | Freq: Every day | ORAL | 1 refills | Status: DC

## 2019-04-04 ENCOUNTER — Other Ambulatory Visit: Payer: Self-pay | Admitting: Orthopaedic Surgery

## 2019-04-04 DIAGNOSIS — M25511 Pain in right shoulder: Secondary | ICD-10-CM

## 2019-04-09 ENCOUNTER — Other Ambulatory Visit: Payer: Self-pay

## 2019-04-09 ENCOUNTER — Ambulatory Visit
Admission: RE | Admit: 2019-04-09 | Discharge: 2019-04-09 | Disposition: A | Discharge status: Home / Self Care | Payer: Medicare Other | Source: Ambulatory Visit | Attending: Orthopaedic Surgery | Admitting: Orthopaedic Surgery

## 2019-04-09 ENCOUNTER — Other Ambulatory Visit: Payer: Medicare Other

## 2019-04-09 DIAGNOSIS — M25511 Pain in right shoulder: Secondary | ICD-10-CM

## 2019-04-09 DIAGNOSIS — M19011 Primary osteoarthritis, right shoulder: Secondary | ICD-10-CM | POA: Diagnosis not present

## 2019-04-09 MED ORDER — GABAPENTIN 100 MG PO CAPS: 100.0000 mg | ORAL_CAPSULE | Freq: Every day | ORAL | 1 refills | Status: DC

## 2019-04-16 ENCOUNTER — Encounter (HOSPITAL_BASED_OUTPATIENT_CLINIC_OR_DEPARTMENT_OTHER): Payer: Self-pay | Admitting: Orthopaedic Surgery

## 2019-04-16 ENCOUNTER — Other Ambulatory Visit: Payer: Self-pay

## 2019-04-19 ENCOUNTER — Encounter (HOSPITAL_BASED_OUTPATIENT_CLINIC_OR_DEPARTMENT_OTHER)
Admission: RE | Admit: 2019-04-19 | Discharge: 2019-04-19 | Disposition: A | Discharge status: Home / Self Care | Payer: Medicare Other | Source: Ambulatory Visit | Attending: Orthopaedic Surgery | Admitting: Orthopaedic Surgery

## 2019-04-19 ENCOUNTER — Other Ambulatory Visit: Payer: Self-pay

## 2019-04-19 DIAGNOSIS — I10 Essential (primary) hypertension: Secondary | ICD-10-CM | POA: Diagnosis not present

## 2019-04-19 DIAGNOSIS — Z79899 Other long term (current) drug therapy: Secondary | ICD-10-CM | POA: Insufficient documentation

## 2019-04-19 DIAGNOSIS — Z01818 Encounter for other preprocedural examination: Secondary | ICD-10-CM | POA: Diagnosis not present

## 2019-04-19 DIAGNOSIS — M19011 Primary osteoarthritis, right shoulder: Secondary | ICD-10-CM | POA: Insufficient documentation

## 2019-04-19 DIAGNOSIS — Z01812 Encounter for preprocedural laboratory examination: Secondary | ICD-10-CM | POA: Diagnosis not present

## 2019-04-19 LAB — SURGICAL PCR SCREEN
MRSA, PCR: NEGATIVE
Staphylococcus aureus: NEGATIVE

## 2019-04-19 LAB — BASIC METABOLIC PANEL
Anion gap: 10 (ref 5–15)
BUN: 30 mg/dL — ABNORMAL HIGH (ref 8–23)
CO2: 25 mmol/L (ref 22–32)
Calcium: 9.5 mg/dL (ref 8.9–10.3)
Chloride: 102 mmol/L (ref 98–111)
Creatinine, Ser: 1.07 mg/dL (ref 0.61–1.24)
GFR calc Af Amer: >60 mL/min (ref >60)
GFR calc non Af Amer: >60 mL/min (ref >60)
Glucose, Bld: 135 mg/dL — ABNORMAL HIGH (ref 70–99)
Potassium: 4.3 mmol/L (ref 3.5–5.1)
Sodium: 137 mmol/L (ref 135–145)

## 2019-04-19 NOTE — Progress Notes (Signed)
      Enhanced Recovery after Surgery for Orthopedics Enhanced Recovery after Surgery is a protocol used to improve the stress on your body and your recovery after surgery.  Patient Instructions  . The night before surgery:  o No food after midnight. ONLY clear liquids after midnight  . The day of surgery (if you do NOT have diabetes):  o Drink ONE (1) Pre-Surgery Clear Ensure as directed.   o This drink was given to you during your hospital  pre-op appointment visit. o The pre-op nurse will instruct you on the time to drink the  Pre-Surgery Ensure depending on your surgery time. o Finish the drink at the designated time by the pre-op nurse.  o Nothing else to drink after completing the  Pre-Surgery Clear Ensure.  . The day of surgery (if you have diabetes): o Drink ONE (1) Gatorade 2 (G2) as directed. o This drink was given to you during your hospital  pre-op appointment visit.  o The pre-op nurse will instruct you on the time to drink the   Gatorade 2 (G2) depending on your surgery time. o Color of the Gatorade may vary. Red is not allowed. o Nothing else to drink after completing the  Gatorade 2 (G2).         If you have questions, please contact your surgeon's office. Surgical soap given with instructions, pt verbalized understanding. Benzoyl gel given with instructions, pt verbalized understanding.

## 2019-04-20 ENCOUNTER — Other Ambulatory Visit (HOSPITAL_COMMUNITY)
Admission: RE | Admit: 2019-04-20 | Discharge: 2019-04-20 | Disposition: A | Discharge status: Home / Self Care | Payer: Medicare Other | Source: Ambulatory Visit | Attending: Orthopaedic Surgery | Admitting: Orthopaedic Surgery

## 2019-04-20 DIAGNOSIS — Z01812 Encounter for preprocedural laboratory examination: Secondary | ICD-10-CM | POA: Insufficient documentation

## 2019-04-20 DIAGNOSIS — Z20822 Contact with and (suspected) exposure to covid-19: Secondary | ICD-10-CM | POA: Insufficient documentation

## 2019-04-20 LAB — SARS CORONAVIRUS 2 (TAT 6-24 HRS): SARS Coronavirus 2: NEGATIVE

## 2019-04-23 NOTE — Anesthesia Preprocedure Evaluation (Signed)
Anesthesia Evaluation  Patient identified by MRN, date of birth, ID band Patient awake    Reviewed: Allergy & Precautions, NPO status , Patient's Chart, lab work & pertinent test results  Airway Mallampati: II  TM Distance: >3 FB Neck ROM: Full    Dental no notable dental hx. (+) Dental Advisory Given   Pulmonary neg pulmonary ROS,    Pulmonary exam normal breath sounds clear to auscultation       Cardiovascular hypertension, Pt. on medications Normal cardiovascular exam Rhythm:Regular Rate:Normal     Neuro/Psych negative neurological ROS  negative psych ROS   GI/Hepatic negative GI ROS, Neg liver ROS,   Endo/Other  negative endocrine ROS  Renal/GU negative Renal ROS     Musculoskeletal  (+) Arthritis ,   Abdominal   Peds  Hematology negative hematology ROS (+)   Anesthesia Other Findings   Reproductive/Obstetrics                             Anesthesia Physical Anesthesia Plan  ASA: II  Anesthesia Plan: General   Post-op Pain Management: GA combined w/ Regional for post-op pain   Induction: Intravenous  PONV Risk Score and Plan: 2 and Ondansetron, Dexamethasone, Treatment may vary due to age or medical condition and Midazolam  Airway Management Planned: Oral ETT  Additional Equipment: None  Intra-op Plan:   Post-operative Plan: Extubation in OR  Informed Consent: I have reviewed the patients History and Physical, chart, labs and discussed the procedure including the risks, benefits and alternatives for the proposed anesthesia with the patient or authorized representative who has indicated his/her understanding and acceptance.     Dental advisory given  Plan Discussed with: CRNA  Anesthesia Plan Comments:         Anesthesia Quick Evaluation

## 2019-04-23 NOTE — H&P (Signed)
PREOPERATIVE H&P  Chief Complaint: RIGHT SHOULDER DJD  HPI: Billy Landry is a 69 y.o. male who is scheduled for RIGHT REVERSE SHOULDER ARTHROPLASTY.   Patient has a past medical history significant for diabetes and hypertension.   Patient is a 69 year-old male who had a history of rotator cuff surgery ten years ago in [place], [place] through a mini open approach.  He has had right shoulder pain for quite some time.  His motion is limited. He has failed non-operative treatment of  Injections under ultrasound guidance. He has struggled with the shoulder for quite some time.  He is unable to play tennis and do the things he enjoys to do.   His symptoms are rated as moderate to severe, and have been worsening.  This is significantly impairing activities of daily living.    Please see clinic note for further details on this patient's care.    He has elected for surgical management.   Past Medical History:  Diagnosis Date  . Allergy   . Arthritis    hands, hips  . BPH associated with nocturia   . DJD of right shoulder   . Essential hypertension 01/05/2015  . Hypertension   . Shingles 12/2017   Past Surgical History:  Procedure Laterality Date  . CATARACT EXTRACTION Left 2013  . RETINAL DETACHMENT SURGERY  2012  . ROTATOR CUFF REPAIR Right 2010  . TONSILECTOMY, ADENOIDECTOMY, BILATERAL MYRINGOTOMY AND TUBES  1968  . VASECTOMY     Social History   Socioeconomic History  . Marital status: Single    Spouse name: Not on file  . Number of children: 3  . Years of education: 16  . Highest education level: Bachelor's degree (e.g., BA, AB, BS)  Occupational History  . Occupation: retired    Comment: communications  Tobacco Use  . Smoking status: Never Smoker  . Smokeless tobacco: Never Used  Substance and Sexual Activity  . Alcohol use: Yes    Alcohol/week: 2.0 standard drinks    Types: 2 Cans of beer per week    Comment: occ  . Drug use: No  . Sexual activity: Yes    Other Topics Concern  . Not on file  Social History Narrative   Did work for [employer]. Currently looking for a job. Caffeine 1 cup of coffee in the morning.   Social Determinants of Health   Financial Resource Strain:   . Difficulty of Paying Living Expenses: Not on file  Food Insecurity:   . Worried About Charity fundraiser in the Last Year: Not on file  . Ran Out of Food in the Last Year: Not on file  Transportation Needs:   . Lack of Transportation (Medical): Not on file  . Lack of Transportation (Non-Medical): Not on file  Physical Activity:   . Days of Exercise per Week: Not on file  . Minutes of Exercise per Session: Not on file  Stress:   . Feeling of Stress : Not on file  Social Connections:   . Frequency of Communication with Friends and Family: Not on file  . Frequency of Social Gatherings with Friends and Family: Not on file  . Attends Religious Services: Not on file  . Active Member of Clubs or Organizations: Not on file  . Attends Archivist Meetings: Not on file  . Marital Status: Not on file   Family History  Problem Relation Age of Onset  . Cancer Mother   . Diabetes Father   .  Stroke Father   . Stroke Brother    Allergies  Allergen Reactions  . Bee Venom Anaphylaxis   Prior to Admission medications   Medication Sig Start Date End Date Taking? Authorizing Provider  Ascorbic Acid (VITAMIN C PO) Take by mouth.   Yes [provider]  Cholecalciferol (VITAMIN D-1000 MAX ST) 1000 units tablet Take by mouth.   Yes [provider]  Cinnamon 500 MG capsule Take by mouth.   Yes [provider]  doxazosin (CARDURA) 2 MG tablet Take 2 mg by mouth daily. 07/11/17  Yes [provider]  gabapentin (NEURONTIN) 100 MG capsule Take 1-3 capsules (100-300 mg total) by mouth at bedtime. 04/09/19  Yes Emeterio Reeve, DO  hydrochlorothiazide (HYDRODIURIL) 12.5 MG tablet Take 1 tablet (12.5 mg total) by mouth  daily. 02/06/19  Yes Emeterio Reeve, DO  L-Lysine 1000 MG TABS Take by mouth.   Yes [provider]  loratadine (CLARITIN) 10 MG tablet Take 10 mg by mouth daily.   Yes [provider]  losartan (COZAAR) 100 MG tablet TAKE 1/2 TAB BY MOUTH EVERY DAY 06/05/18  Yes Emeterio Reeve, DO  Melatonin 5 MG TABS Take by mouth.   Yes [provider]  meloxicam (MOBIC) 15 MG tablet Take 1 tablet (15 mg total) by mouth daily. 03/19/19  Yes Emeterio Reeve, DO  valACYclovir (VALTREX) 1000 MG tablet TAKE 1/2 TABLET BY MOUTH DAILY AS NEEDED 02/08/19  Yes Emeterio Reeve, DO  vitamin E 400 UNIT capsule Take 400 Units by mouth daily.   Yes [provider]    ROS: All other systems have been reviewed and were otherwise negative with the exception of those mentioned in the HPI and as above.  Physical Exam: General: Alert, no acute distress Cardiovascular: No pedal edema Respiratory: No cyanosis, no use of accessory musculature GI: No organomegaly, abdomen is soft and non-tender Skin: No lesions in the area of chief complaint Neurologic: Sensation intact distally Psychiatric: Patient is competent for consent with normal mood and affect Lymphatic: No axillary or cervical lymphadenopathy  MUSCULOSKELETAL:  Right shoulder: active forward elevation to about 120 degrees.  External rotation to 30 degrees versus 70 degrees.  Internal rotation to his back pocket versus T8.  Cuff strength is relatively preserved throughout.  Imaging: X-rays demonstrate primary glenohumeral arthritis. He has a significant inferior osteophyte and end stage bone on bone articulation.    Assessment: End stage arthritis in the setting of previous cuff repair.    Plan: Plan for Procedure(s): RIGHT REVERSE SHOULDER ARTHROPLASTY  The risks benefits and alternatives were discussed with the patient including but not limited to the risks of nonoperative treatment, versus surgical  intervention including infection, bleeding, nerve injury,  blood clots, cardiopulmonary complications, morbidity, mortality, among others, and they were willing to proceed.   We specifically talked about neurovascular injury, infection and periprosthetic fracture, as well as longevity of the implants.  He understands all of this and would like to proceed.    The patient acknowledged the explanation, agreed to proceed with the plan and consent was signed.   Operative Plan: Right reverse total shoulder arthroplasty Discharge Medications: Standard DVT Prophylaxis: None  Physical Therapy: Outpatient PT Special Discharge needs: [place], PA-C  04/23/2019 7:13 AM

## 2019-04-24 ENCOUNTER — Ambulatory Visit (HOSPITAL_BASED_OUTPATIENT_CLINIC_OR_DEPARTMENT_OTHER): Payer: Medicare Other | Admitting: Anesthesiology

## 2019-04-24 ENCOUNTER — Encounter (HOSPITAL_BASED_OUTPATIENT_CLINIC_OR_DEPARTMENT_OTHER)
Admission: RE | Disposition: A | Discharge status: Home / Self Care | Payer: Self-pay | Source: Home / Self Care | Attending: Orthopaedic Surgery

## 2019-04-24 ENCOUNTER — Ambulatory Visit (HOSPITAL_COMMUNITY): Payer: Medicare Other

## 2019-04-24 ENCOUNTER — Other Ambulatory Visit: Payer: Self-pay

## 2019-04-24 ENCOUNTER — Ambulatory Visit (HOSPITAL_BASED_OUTPATIENT_CLINIC_OR_DEPARTMENT_OTHER)
Admission: RE | Admit: 2019-04-24 | Discharge: 2019-04-24 | Disposition: A | Discharge status: Home / Self Care | Payer: Medicare Other | Attending: Orthopaedic Surgery | Admitting: Orthopaedic Surgery

## 2019-04-24 ENCOUNTER — Encounter (HOSPITAL_BASED_OUTPATIENT_CLINIC_OR_DEPARTMENT_OTHER): Payer: Self-pay | Admitting: Orthopaedic Surgery

## 2019-04-24 DIAGNOSIS — Z79899 Other long term (current) drug therapy: Secondary | ICD-10-CM | POA: Diagnosis not present

## 2019-04-24 DIAGNOSIS — M19011 Primary osteoarthritis, right shoulder: Secondary | ICD-10-CM | POA: Diagnosis not present

## 2019-04-24 DIAGNOSIS — G8918 Other acute postprocedural pain: Secondary | ICD-10-CM | POA: Diagnosis not present

## 2019-04-24 DIAGNOSIS — Z09 Encounter for follow-up examination after completed treatment for conditions other than malignant neoplasm: Secondary | ICD-10-CM

## 2019-04-24 DIAGNOSIS — N401 Enlarged prostate with lower urinary tract symptoms: Secondary | ICD-10-CM | POA: Diagnosis not present

## 2019-04-24 DIAGNOSIS — I10 Essential (primary) hypertension: Secondary | ICD-10-CM | POA: Diagnosis not present

## 2019-04-24 DIAGNOSIS — Z85828 Personal history of other malignant neoplasm of skin: Secondary | ICD-10-CM | POA: Diagnosis not present

## 2019-04-24 DIAGNOSIS — Z791 Long term (current) use of non-steroidal anti-inflammatories (NSAID): Secondary | ICD-10-CM | POA: Insufficient documentation

## 2019-04-24 DIAGNOSIS — Z833 Family history of diabetes mellitus: Secondary | ICD-10-CM | POA: Diagnosis not present

## 2019-04-24 DIAGNOSIS — E119 Type 2 diabetes mellitus without complications: Secondary | ICD-10-CM | POA: Insufficient documentation

## 2019-04-24 DIAGNOSIS — Z96611 Presence of right artificial shoulder joint: Secondary | ICD-10-CM | POA: Diagnosis not present

## 2019-04-24 DIAGNOSIS — Z471 Aftercare following joint replacement surgery: Secondary | ICD-10-CM | POA: Diagnosis not present

## 2019-04-24 DIAGNOSIS — M75101 Unspecified rotator cuff tear or rupture of right shoulder, not specified as traumatic: Secondary | ICD-10-CM | POA: Diagnosis not present

## 2019-04-24 HISTORY — PX: REVERSE SHOULDER ARTHROPLASTY: SHX5054

## 2019-04-24 HISTORY — DX: Benign prostatic hyperplasia with lower urinary tract symptoms: N40.1

## 2019-04-24 HISTORY — DX: Unspecified osteoarthritis, unspecified site: M19.90

## 2019-04-24 HISTORY — DX: Primary osteoarthritis, right shoulder: M19.011

## 2019-04-24 SURGERY — ARTHROPLASTY, SHOULDER, TOTAL, REVERSE
Anesthesia: General | Site: Shoulder | Laterality: Right

## 2019-04-24 MED ORDER — PHENYLEPHRINE 40 MCG/ML (10ML) SYRINGE FOR IV PUSH (FOR BLOOD PRESSURE SUPPORT)
PREFILLED_SYRINGE | INTRAVENOUS | Status: DC | PRN
  Administered 2019-04-24: 120 ug via INTRAVENOUS

## 2019-04-24 MED ORDER — PHENYLEPHRINE 40 MCG/ML (10ML) SYRINGE FOR IV PUSH (FOR BLOOD PRESSURE SUPPORT)
PREFILLED_SYRINGE | INTRAVENOUS | Status: AC
  Filled 2019-04-24: qty 10

## 2019-04-24 MED ORDER — TRANEXAMIC ACID-NACL 1000-0.7 MG/100ML-% IV SOLN
INTRAVENOUS | Status: AC
  Filled 2019-04-24: qty 100

## 2019-04-24 MED ORDER — OXYCODONE HCL 5 MG PO TABS: 5.0000 mg | ORAL_TABLET | Freq: Once | ORAL | Status: DC | PRN

## 2019-04-24 MED ORDER — VANCOMYCIN HCL 1000 MG IV SOLR
INTRAVENOUS | Status: AC
  Filled 2019-04-24: qty 1000

## 2019-04-24 MED ORDER — FENTANYL CITRATE (PF) 100 MCG/2ML IJ SOLN
50.0000 ug | INTRAMUSCULAR | Status: DC | PRN
  Administered 2019-04-24: 50 ug via INTRAVENOUS

## 2019-04-24 MED ORDER — LIDOCAINE 2% (20 MG/ML) 5 ML SYRINGE
INTRAMUSCULAR | Status: AC
  Filled 2019-04-24: qty 5

## 2019-04-24 MED ORDER — TRANEXAMIC ACID-NACL 1000-0.7 MG/100ML-% IV SOLN
1000.0000 mg | INTRAVENOUS | Status: AC
  Administered 2019-04-24: 1000 mg via INTRAVENOUS

## 2019-04-24 MED ORDER — ONDANSETRON HCL 4 MG/2ML IJ SOLN
INTRAMUSCULAR | Status: DC | PRN
  Administered 2019-04-24: 4 mg via INTRAVENOUS

## 2019-04-24 MED ORDER — SUGAMMADEX SODIUM 200 MG/2ML IV SOLN
INTRAVENOUS | Status: DC | PRN
  Administered 2019-04-24: 200 mg via INTRAVENOUS

## 2019-04-24 MED ORDER — MIDAZOLAM HCL 2 MG/2ML IJ SOLN
INTRAMUSCULAR | Status: AC
  Filled 2019-04-24: qty 2

## 2019-04-24 MED ORDER — EPHEDRINE SULFATE-NACL 50-0.9 MG/10ML-% IV SOSY
PREFILLED_SYRINGE | INTRAVENOUS | Status: DC | PRN
  Administered 2019-04-24 (×3): 10 mg via INTRAVENOUS
  Administered 2019-04-24: 5 mg via INTRAVENOUS

## 2019-04-24 MED ORDER — ONDANSETRON HCL 4 MG PO TABS: 4.0000 mg | ORAL_TABLET | Freq: Three times a day (TID) | ORAL | 1 refills | Status: AC | PRN

## 2019-04-24 MED ORDER — CEFAZOLIN SODIUM-DEXTROSE 2-4 GM/100ML-% IV SOLN
INTRAVENOUS | Status: AC
  Filled 2019-04-24: qty 100

## 2019-04-24 MED ORDER — MELOXICAM 7.5 MG PO TABS: 7.5000 mg | ORAL_TABLET | Freq: Two times a day (BID) | ORAL | 2 refills | Status: DC

## 2019-04-24 MED ORDER — BUPIVACAINE HCL (PF) 0.5 % IJ SOLN
INTRAMUSCULAR | Status: DC | PRN
  Administered 2019-04-24: 10 mL via PERINEURAL

## 2019-04-24 MED ORDER — PHENYLEPHRINE HCL (PRESSORS) 10 MG/ML IV SOLN
INTRAVENOUS | Status: AC
  Filled 2019-04-24: qty 2

## 2019-04-24 MED ORDER — ACETAMINOPHEN 500 MG PO TABS: 1000.0000 mg | ORAL_TABLET | Freq: Three times a day (TID) | ORAL | 0 refills | Status: AC

## 2019-04-24 MED ORDER — BUPIVACAINE HCL (PF) 0.5 % IJ SOLN: INTRAMUSCULAR | Status: DC | PRN

## 2019-04-24 MED ORDER — OXYCODONE HCL 5 MG/5ML PO SOLN: 5.0000 mg | Freq: Once | ORAL | Status: DC | PRN

## 2019-04-24 MED ORDER — PROPOFOL 500 MG/50ML IV EMUL
INTRAVENOUS | Status: AC
  Filled 2019-04-24: qty 50

## 2019-04-24 MED ORDER — ROCURONIUM BROMIDE 50 MG/5ML IV SOSY
PREFILLED_SYRINGE | INTRAVENOUS | Status: DC | PRN
  Administered 2019-04-24: 50 mg via INTRAVENOUS

## 2019-04-24 MED ORDER — VANCOMYCIN HCL 1000 MG IV SOLR
INTRAVENOUS | Status: DC | PRN
  Administered 2019-04-24: 1000 mg

## 2019-04-24 MED ORDER — ROCURONIUM BROMIDE 10 MG/ML (PF) SYRINGE
PREFILLED_SYRINGE | INTRAVENOUS | Status: AC
  Filled 2019-04-24: qty 10

## 2019-04-24 MED ORDER — LACTATED RINGERS IV SOLN: INTRAVENOUS | Status: DC

## 2019-04-24 MED ORDER — DEXAMETHASONE SODIUM PHOSPHATE 10 MG/ML IJ SOLN
INTRAMUSCULAR | Status: DC | PRN
  Administered 2019-04-24: 10 mg via INTRAVENOUS

## 2019-04-24 MED ORDER — PROPOFOL 10 MG/ML IV BOLUS
INTRAVENOUS | Status: DC | PRN
  Administered 2019-04-24: 120 mg via INTRAVENOUS

## 2019-04-24 MED ORDER — FENTANYL CITRATE (PF) 100 MCG/2ML IJ SOLN
INTRAMUSCULAR | Status: AC
  Filled 2019-04-24: qty 2

## 2019-04-24 MED ORDER — MIDAZOLAM HCL 2 MG/2ML IJ SOLN: 1.0000 mg | INTRAMUSCULAR | Status: DC | PRN

## 2019-04-24 MED ORDER — OXYCODONE HCL 5 MG PO TABS: ORAL_TABLET | ORAL | 0 refills | Status: AC

## 2019-04-24 MED ORDER — CEFAZOLIN SODIUM-DEXTROSE 2-4 GM/100ML-% IV SOLN
2.0000 g | INTRAVENOUS | Status: AC
  Administered 2019-04-24: 2 g via INTRAVENOUS

## 2019-04-24 MED ORDER — DEXAMETHASONE SODIUM PHOSPHATE 10 MG/ML IJ SOLN
INTRAMUSCULAR | Status: AC
  Filled 2019-04-24: qty 1

## 2019-04-24 MED ORDER — BUPIVACAINE LIPOSOME 1.3 % IJ SUSP
INTRAMUSCULAR | Status: DC | PRN
  Administered 2019-04-24: 10 mL via PERINEURAL

## 2019-04-24 MED ORDER — PHENYLEPHRINE HCL-NACL 10-0.9 MG/250ML-% IV SOLN
INTRAVENOUS | Status: DC | PRN
  Administered 2019-04-24: 50 ug/min via INTRAVENOUS

## 2019-04-24 MED ORDER — ONDANSETRON HCL 4 MG/2ML IJ SOLN
INTRAMUSCULAR | Status: AC
  Filled 2019-04-24: qty 2

## 2019-04-24 MED ORDER — CHLORHEXIDINE GLUCONATE 4 % EX LIQD: 60.0000 mL | Freq: Once | CUTANEOUS | Status: DC

## 2019-04-24 MED ORDER — MEPERIDINE HCL 25 MG/ML IJ SOLN: 6.2500 mg | INTRAMUSCULAR | Status: DC | PRN

## 2019-04-24 MED ORDER — PROMETHAZINE HCL 25 MG/ML IJ SOLN: 6.2500 mg | INTRAMUSCULAR | Status: DC | PRN

## 2019-04-24 MED ORDER — HYDROMORPHONE HCL 1 MG/ML IJ SOLN: 0.2500 mg | INTRAMUSCULAR | Status: DC | PRN

## 2019-04-24 MED ORDER — LIDOCAINE 2% (20 MG/ML) 5 ML SYRINGE
INTRAMUSCULAR | Status: DC | PRN
  Administered 2019-04-24: 100 mg via INTRAVENOUS

## 2019-04-24 MED ORDER — EPHEDRINE 5 MG/ML INJ
INTRAVENOUS | Status: AC
  Filled 2019-04-24: qty 10

## 2019-04-24 SURGICAL SUPPLY — 82 items
BASEPLATE GLENOID STD REV 42 (Joint) ×3 IMPLANT
BENZOIN TINCTURE PRP APPL 2/3 (GAUZE/BANDAGES/DRESSINGS) IMPLANT
BIT DRILL 3.2 PERIPHERAL SCREW (BIT) ×3 IMPLANT
BLADE HEX COATED 2.75 (ELECTRODE) ×3 IMPLANT
BLADE SAGITTAL 25.0X1.27X90 (BLADE) IMPLANT
BLADE SAGITTAL 25.0X1.27X90MM (BLADE)
BLADE SAW SAG 29X58X.64 (BLADE) IMPLANT
BLADE SAW SAG 73X25 THK (BLADE) ×2
BLADE SAW SGTL 73X25 THK (BLADE) ×1 IMPLANT
BLADE SURG 10 STRL SS (BLADE) ×3 IMPLANT
BLADE SURG 15 STRL LF DISP TIS (BLADE) ×2 IMPLANT
BLADE SURG 15 STRL SS (BLADE) ×4
BNDG COHESIVE 4X5 TAN STRL (GAUZE/BANDAGES/DRESSINGS) IMPLANT
CHLORAPREP W/TINT 26 (MISCELLANEOUS) ×3 IMPLANT
CLOSURE STERI-STRIP 1/2X4 (GAUZE/BANDAGES/DRESSINGS) ×1
CLSR STERI-STRIP ANTIMIC 1/2X4 (GAUZE/BANDAGES/DRESSINGS) ×2 IMPLANT
COOLER ICEMAN CLASSIC (MISCELLANEOUS) ×3 IMPLANT
COVER BACK TABLE 60X90IN (DRAPES) ×3 IMPLANT
COVER MAYO STAND STRL (DRAPES) ×3 IMPLANT
COVER WAND RF STERILE (DRAPES) IMPLANT
DECANTER SPIKE VIAL GLASS SM (MISCELLANEOUS) IMPLANT
DRAPE IMP U-DRAPE 54X76 (DRAPES) ×3 IMPLANT
DRAPE INCISE IOBAN 66X45 STRL (DRAPES) ×6 IMPLANT
DRAPE U-SHAPE 76X120 STRL (DRAPES) ×6 IMPLANT
DRSG AQUACEL AG ADV 3.5X 6 (GAUZE/BANDAGES/DRESSINGS) ×3 IMPLANT
ELECT BLADE 4.0 EZ CLEAN MEGAD (MISCELLANEOUS) ×3
ELECT REM PT RETURN 9FT ADLT (ELECTROSURGICAL) ×3
ELECTRODE BLDE 4.0 EZ CLN MEGD (MISCELLANEOUS) ×1 IMPLANT
ELECTRODE REM PT RTRN 9FT ADLT (ELECTROSURGICAL) ×1 IMPLANT
FULL WEDGE BASEPLATE SHOULDER (Miscellaneous) ×3 IMPLANT
GAUZE XEROFORM 1X8 LF (GAUZE/BANDAGES/DRESSINGS) IMPLANT
GLOVE BIO SURGEON STRL SZ 6.5 (GLOVE) ×2 IMPLANT
GLOVE BIO SURGEONS STRL SZ 6.5 (GLOVE) ×1
GLOVE BIOGEL PI IND STRL 6.5 (GLOVE) ×1 IMPLANT
GLOVE BIOGEL PI IND STRL 7.0 (GLOVE) ×1 IMPLANT
GLOVE BIOGEL PI IND STRL 8 (GLOVE) ×1 IMPLANT
GLOVE BIOGEL PI INDICATOR 6.5 (GLOVE) ×2
GLOVE BIOGEL PI INDICATOR 7.0 (GLOVE) ×2
GLOVE BIOGEL PI INDICATOR 8 (GLOVE) ×2
GLOVE ECLIPSE 6.5 STRL STRAW (GLOVE) ×6 IMPLANT
GLOVE ECLIPSE 8.0 STRL XLNG CF (GLOVE) ×3 IMPLANT
GOWN STRL REUS W/ TWL LRG LVL3 (GOWN DISPOSABLE) ×1 IMPLANT
GOWN STRL REUS W/TWL LRG LVL3 (GOWN DISPOSABLE) ×2
GOWN STRL REUS W/TWL XL LVL3 (GOWN DISPOSABLE) IMPLANT
GUIDEWIRE GLENOID 2.5X220 (WIRE) ×3 IMPLANT
HANDPIECE INTERPULSE COAX TIP (DISPOSABLE) ×2
IMPL REVERSE SHOULDER 0X3.5 (Shoulder) ×1 IMPLANT
IMPLANT REVERSE SHOULDER 0X3.5 (Shoulder) ×3 IMPLANT
INSERT SHLD REV 42X6 ANGLE B (Insert) ×3 IMPLANT
KIT STABILIZATION SHOULDER (MISCELLANEOUS) ×3 IMPLANT
MANIFOLD NEPTUNE II (INSTRUMENTS) ×3 IMPLANT
PACK ARTHROSCOPY DSU (CUSTOM PROCEDURE TRAY) ×3 IMPLANT
PACK BASIN DAY SURGERY FS (CUSTOM PROCEDURE TRAY) ×3 IMPLANT
PAD COLD SHLDR WRAP-ON (PAD) ×3 IMPLANT
PAD ORTHO SHOULDER 7X19 LRG (SOFTGOODS) ×3 IMPLANT
PENCIL SMOKE EVACUATOR (MISCELLANEOUS) ×3 IMPLANT
RESTRAINT HEAD UNIVERSAL NS (MISCELLANEOUS) ×3 IMPLANT
SCREW 5.0X38 SMALL F/PERFORM (Screw) ×3 IMPLANT
SCREW 5.5X22 (Screw) ×3 IMPLANT
SCREW BONE 6.5X40 SM (Screw) ×3 IMPLANT
SCREW PERIPHERAL 30 (Screw) ×3 IMPLANT
SCREW PERIPHERAL 5.0X34 (Screw) ×3 IMPLANT
SET HNDPC FAN SPRY TIP SCT (DISPOSABLE) ×1 IMPLANT
SHEET MEDIUM DRAPE 40X70 STRL (DRAPES) ×3 IMPLANT
SLEEVE SCD COMPRESS KNEE MED (MISCELLANEOUS) ×3 IMPLANT
SPONGE LAP 18X18 RF (DISPOSABLE) ×6 IMPLANT
STEM HUMERAL AEQUALIS 5BX82 (Stem) ×3 IMPLANT
SUT ETHIBOND 2 OS 4 DA (SUTURE) ×6 IMPLANT
SUT FIBERWIRE #5 38 CONV NDL (SUTURE) ×12
SUT MNCRL AB 3-0 PS2 18 (SUTURE) IMPLANT
SUT MNCRL AB 4-0 PS2 18 (SUTURE) ×3 IMPLANT
SUT MON AB 2-0 CT1 36 (SUTURE) IMPLANT
SUT VIC AB 0 CT1 18XCR BRD 8 (SUTURE) IMPLANT
SUT VIC AB 0 CT1 8-18 (SUTURE)
SUT VIC AB 2-0 SH 27 (SUTURE) ×2
SUT VIC AB 2-0 SH 27XBRD (SUTURE) ×1 IMPLANT
SUTURE FIBERWR #5 38 CONV NDL (SUTURE) ×4 IMPLANT
SYR BULB IRRIGATION 50ML (SYRINGE) ×3 IMPLANT
TOWEL GREEN STERILE FF (TOWEL DISPOSABLE) ×3 IMPLANT
TUBE CONNECTING 20'X1/4 (TUBING) ×1
TUBE CONNECTING 20X1/4 (TUBING) ×2 IMPLANT
TUBE SUCTION HIGH CAP CLEAR NV (SUCTIONS) ×3 IMPLANT

## 2019-04-24 NOTE — Anesthesia Procedure Notes (Signed)
Procedure Name: Intubation Date/Time: 04/24/2019 7:33 AM Performed by: Genelle Bal, CRNA Pre-anesthesia Checklist: Patient identified, Emergency Drugs available, Suction available and Patient being monitored Patient Re-evaluated:Patient Re-evaluated prior to induction Oxygen Delivery Method: Circle system utilized Preoxygenation: Pre-oxygenation with 100% oxygen Induction Type: IV induction Ventilation: Mask ventilation without difficulty Laryngoscope Size: Miller and 2 Grade View: Grade I Tube type: Oral Tube size: 8.0 mm Number of attempts: 1 Airway Equipment and Method: Stylet and Oral airway Placement Confirmation: ETT inserted through vocal cords under direct vision,  positive ETCO2 and breath sounds checked- equal and bilateral Secured at: 23 cm Tube secured with: Tape Dental Injury: Teeth and Oropharynx as per pre-operative assessment

## 2019-04-24 NOTE — Anesthesia Postprocedure Evaluation (Signed)
Anesthesia Post Note  Patient: Billy Landry  Procedure(s) Performed: RIGHT REVERSE SHOULDER ARTHROPLASTY (Right Shoulder)     Patient location during evaluation: PACU Anesthesia Type: General Level of consciousness: sedated and patient cooperative Pain management: pain level controlled Vital Signs Assessment: post-procedure vital signs reviewed and stable Respiratory status: spontaneous breathing Cardiovascular status: stable Anesthetic complications: no    Last Vitals:  Vitals:   04/24/19 1030 04/24/19 1109  BP: 125/70 127/74  Pulse: 81 85  Resp: 13 18  Temp:  36.7 C  SpO2: 93% 95%    Last Pain:  Vitals:   04/24/19 1109  TempSrc: Oral  PainSc: 0-No pain                 Nolon Nations

## 2019-04-24 NOTE — Anesthesia Procedure Notes (Signed)
Anesthesia Regional Block: Interscalene brachial plexus block   Pre-Anesthetic Checklist: ,, timeout performed, Correct Patient, Correct Site, Correct Laterality, Correct Procedure, Correct Position, site marked, Risks and benefits discussed,  Surgical consent,  Pre-op evaluation,  At surgeon's request and post-op pain management  Laterality: Right  Prep: chloraprep       Needles:  Injection technique: Single-shot  Needle Type: Stimulator Needle - 40     Needle Length: 4cm  Needle Gauge: 22     Additional Needles:   Procedures:,,,, ultrasound used (permanent image in chart),,,,  Narrative:  Start time: 04/24/2019 7:07 AM End time: 04/24/2019 7:12 AM Injection made incrementally with aspirations every 5 mL.  Performed by: Personally  Anesthesiologist: Nolon Nations, MD  Additional Notes: BP cuff, EKG monitors applied. Sedation begun. Nerve location verified with U/S. Anesthetic injected incrementally, slowly , and after neg aspirations under direct u/s guidance. Good perineural spread. Tolerated well.

## 2019-04-24 NOTE — Op Note (Signed)
Orthopaedic Surgery Operative Note (CSN: [id number])  Billy Landry  12/22/1950 Date of Surgery: 04/24/2019   Diagnoses:  Right shoulder B2 glenoid and end stage arthritis  Procedure: Right Reverse Total Shoulder Arthroplasty   Operative Finding Successful completion of planned procedure.  Patient's glenoid had significant posterior erosion and retroversion greater than 30 degrees and due to his partial subscap tear as well as his thin supraspinatus and his posterior glenoid wear we felt that reverse shoulder arthroplasty was appropriate.  Fixation was robust and stability was impressive without final plans.  The baseplate may have been placed slightly neutral to a greater degree or two superior thus we broke from our blueprint protocol preop planning and placed a 42 glenosphere to avoid notching.  Post-operative plan: The patient will be NWB in sling.  The patient will be admitted overnight.  DVT prophylaxis not indicated in isolated upper extremity surgery patient with no specific risks factors.  Pain control with PRN pain medication preferring oral medicines.  Follow up plan will be scheduled in approximately 7 days for incision check and XR.  Physical therapy to start after first visit.  Implants: Tornier 5 stem short, 0 high tray, 42+6 poly, 42 glenosphere, 29 full wedge  Post-Op Diagnosis: Same Surgeons:Primary: Varkey, Dax T, MD Assistants:Caroline McBane PA-C Location: [facility] OR ROOM 6 Anesthesia: General with Exparel Interscalene Antibiotics: Ancef 2g preop, Vancomycin 1000mg locally Tourniquet time: None Estimated Blood Loss: 100 Complications: None Specimens: None Implants: Implant Name Type Inv. Item Serial No. Manufacturer Lot No. LRB No. Used Action  full edge augment baseplate Orthopedic Implant  [id number] [employer]  Right 1 Implanted  standard glenosphere Orthopedic Implant  [id number] [employer]  Right 1 Implanted  SCREW BONE 6.5X40 SM - [id number] Screw SCREW BONE  6.5X40 SM  [employer]  Right 1 Implanted  34 screw Screw   [employer]  Right 1 Implanted  38 screw Screw     Right 1 Implanted  30 screw Screw   [employer]  Right 1 Implanted  22 screw Screw   [employer]  Right 1 Implanted  standard ptc humeral stem Orthopedic Implant  [id number] [employer]  Right 1 Implanted  reversed insert Orthopedic Implant  [id number] [employer]  Right 1 Implanted  IMPLANT REVERSE SHOULDER 0X3.5 - [id number] Shoulder IMPLANT REVERSE SHOULDER 0X3.5 [id number] [employer]  Right 1 Implanted    Indications for Surgery:   Billy Landry is a 68 y.o. male with end stage bone on bone arthritis and over 90% posterior migration of the humeral head.  Patient also had previous history of open rotator cuff repair and with these factors as well as the patient's age we felt that reverse social arthroplasty will be a better option for him to avoid late instability and/or cuff failure.  Benefits and risks of operative and nonoperative management were discussed prior to surgery with patient/guardian(s) and informed consent form was completed.  Infection and need for further surgery were discussed as was prosthetic stability and cuff issues.  We additionally specifically discussed risks of axillary nerve injury, infection, periprosthetic fracture, continued pain and longevity of implants prior to beginning procedure.      Procedure:   The patient was identified in the preoperative holding area where the surgical site was marked. Block placed by anesthesia with exparel.  The patient was taken to the OR where a procedural timeout was called and the above noted anesthesia was induced.  The patient was positioned beachchair on allen table with spider   arm positioner.  Preoperative antibiotics were dosed.  The patient's right shoulder was prepped and draped in the usual sterile fashion.  A second preoperative timeout was called.       Standard deltopectoral approach was performed with a #10  blade. We dissected down to the subcutaneous tissues and the cephalic vein was taken laterally with the deltoid. Clavipectoral fascia was incised in line with the incision. Deep retractors were placed. The long of the biceps tendon was identified and there was significant tenosynovitis present.  Tenodesis was performed to the pectoralis tendon with #2 Ethibond. The remaining biceps was followed up into the rotator interval where it was released.   The subscapularis was taken down in a full thickness layer with capsule along the humeral neck extending inferiorly around the humeral head. We continued releasing the capsule directly off of the osteophytes inferiorly all the way around the corner. This allowed us to dislocate the humeral head.   The humeral head had evidence of severe osteoarthritic wear with full-thickness cartilage loss and exposed subchondral bone. There was significant flattening of the humeral head.   The subscapularis had partial tearing with medialization of the biceps and the supraspinatus was relatively thin.  This in combination with patient's age and posterior erosion we felt that reverse shoulder arthroplasty would be more appropriate.  There were osteophytes along the inferior humeral neck. The osteophytes were removed with an osteotome and a rongeur.  Osteophytes were removed with a rongeur and an osteotome and the anatomic neck was well visualized.     A humeral cutting guide was inserted down the intramedullary canal. The version was set at 20 of retroversion. Humeral osteotomy was performed with an oscillating saw. The head fragment was passed off the back table. A starter awl was used to open the humeral canal. We next used T-handle straight sound reamers to ream up to an appropriate fit. A chisel was used to remove proximal humeral bone. We then broached starting with a size one broach and broaching up to 5 which obtained an appropriate fit. The broach handle was removed. A  cut protector was placed. The broach handle was removed and a cut protector was placed. The humerus was retracted posteriorly and we turned our attention to glenoid exposure.  The subscapularis was again identified and immediately we took care to palpate the axillary nerve anteriorly and verify its position with gentle palpation as well as the tug test.  We then released the SGHL with bovie cautery prior to placing a curved mayo at the junction of the anterior glenoid well above the axillary nerve and bluntly dissecting the subscapularis from the capsule.  We then carefully protected the axillary nerve as we gently released the inferior capsule to fully mobilize the subscapularis.  An anterior deltoid retractor was then placed as well as a small Hohmann retractor superiorly.   The glenoid was inspected and had evidence of severe osteoarthritic wear with full-thickness cartilage loss and exposed subchondral bone. The remaining labrum was removed circumferentially taking great care not to disrupt the posterior capsule.   At this point we felt based on blueprint templating that a full wedge augment was necessary.  We began by using a full wedge guide to place our center pin as was templated.  We had good position of this pin and we proceeded with our starter center drill.  This allowed for us to use the 15 degree full wedge reamer obtaining circumferential witness marks and good bone preparation for ingrowth.    At this point we proceeded with our center drill and had an intact vault.  We then drilled our center screw to a length of 40 mm.    We selected a 40 mm x 6.5 mm screw and the full wedge baseplate which was placed in the same orientation as our reaming.  We double checked that we had good apposition of the base plate to bone and then proceeded to place 3 locking screws and one nonlocking screw as is typical.  Glenosphere was placed in typical fashion size 42.  We turned attention back to the humeral  side. The cut protector was removed. We trialed with multiple size tray and polyethylene options and selected a 6 which provided good stability and range of motion without excess soft tissue tension. The offset was dialed in to match the normal anatomy. The shoulder was trialed.  There was good ROM in all planes and the shoulder was stable with no inferior translation.  The real humeral implants were opened after again confirming sizes.  The trial was removed. #5 Fiberwire x4 sutures passed through the humeral neck for subscap repair. The humeral component was press-fit obtaining a secure fit. A +0 high offset tray was selected and impacted onto the stem.  A 42+6 polyethylene liner was impacted onto the stem.  The joint was reduced and thoroughly irrigated with pulsatile lavage. Subscap was repaired back with #5 Fiberwire sutures through bone tunnels. Hemostasis was obtained. The deltopectoral interval was reapproximated with #1 Ethibond. The subcutaneous tissues were closed with 2-0 Vicryl and the skin was closed with running monocryl.    The wounds were cleaned and dried and an Aquacel dressing was placed. The drapes taken down. The arm was placed into sling with abduction pillow. Patient was awakened, extubated, and transferred to the recovery room in stable condition. There were no intraoperative complications. The sponge, needle, and attention counts were  correct at the end of the case.    Caroline McBane, PA-C, present and scrubbed throughout the case, critical for completion in a timely fashion, and for retraction, instrumentation, closure.    

## 2019-04-24 NOTE — Discharge Instructions (Signed)

## 2019-04-24 NOTE — Progress Notes (Signed)
Assisted Dr. Germeroth with right, ultrasound guided, interscalene  block. Side rails up, monitors on throughout procedure. See vital signs in flow sheet. Tolerated Procedure well.  

## 2019-04-24 NOTE — Transfer of Care (Signed)
Immediate Anesthesia Transfer of Care Note  Patient: Billy Landry  Procedure(s) Performed: RIGHT REVERSE SHOULDER ARTHROPLASTY (Right Shoulder)  Patient Location: PACU  Anesthesia Type:GA combined with regional for post-op pain  Level of Consciousness: awake, alert  and oriented  Airway & Oxygen Therapy: Patient Spontanous Breathing and Patient connected to face mask oxygen  Post-op Assessment: Report given to RN and Post -op Vital signs reviewed and stable  Post vital signs: Reviewed and stable  Last Vitals:  Vitals Value Taken Time  BP 131/78 04/24/19 0945  Temp    Pulse 81 04/24/19 0946  Resp 13 04/24/19 0946  SpO2 100 % 04/24/19 0946  Vitals shown include unvalidated device data.  Last Pain:  Vitals:   04/24/19 0635  TempSrc: Oral  PainSc: 0-No pain         Complications: No apparent anesthesia complications

## 2019-04-25 ENCOUNTER — Encounter: Payer: Self-pay | Admitting: *Deleted

## 2019-05-02 DIAGNOSIS — M19011 Primary osteoarthritis, right shoulder: Secondary | ICD-10-CM | POA: Diagnosis not present

## 2019-05-03 NOTE — Telephone Encounter (Signed)
Dr. Griffin Basil would need to place the referral for PT and have it sent here.

## 2019-05-07 NOTE — Progress Notes (Signed)
Subjective:   Billy Landry is a 69 y.o. male who presents for Medicare Annual/Subsequent preventive examination.  Review of Systems:  No ROS.  Medicare Wellness Virtual Visit.  Visual/audio telehealth visit, UTA vital signs.   See social history for additional risk factors.    Cardiac Risk Factors include: advanced age (>17men, >27 women);hypertension;male gender;sedentary lifestyle  Sleep patterns: Getting 7-10 hours of sleep a night. Wakes up 3-5 times a night to void.  Wakes up and feels rested and ready for the day. Home Safety/Smoke Alarms: Feels safe in home. Smoke alarms in place.  Living environment; Lives with friends in 2 story home.  Stairs have hand rails on them. Shower is a step over tub combo and no grab bars in place. Seat Belt Safety/Bike Helmet: Wears seat belt.  Male:   CCS- patient declined for now    PSA- UTD Lab Results  Component Value Date   PSA 0.8 01/29/2018   PSA 1.3 01/24/2017   PSA 1.5 02/29/2012        Objective:    Vitals: BP (!) 143/84   Pulse 66   Ht 5\' 10"  (1.778 m)   Wt 175 lb (79.4 kg)   BMI 25.11 kg/m   Body mass index is 25.11 kg/m.  Advanced Directives 05/20/2019 05/13/2019 04/24/2019 04/16/2019 01/29/2018 01/05/2015  Does Patient Have a Medical Advance Directive? Yes Yes Yes Yes Yes No  Type of Advance Directive Living will Living will - Living will Living will -  Does patient want to make changes to medical advance directive? No - Patient declined No - Patient declined No - Patient declined No - Patient declined No - Patient declined -  Would patient like information on creating a medical advance directive? No - Patient declined No - Patient declined No - Patient declined - - No - patient declined information    Tobacco Social History   Tobacco Use  Smoking Status Never Smoker  Smokeless Tobacco Never Used     Counseling given: No   Clinical Intake:                       Past Medical History:  Diagnosis  Date  . Allergy   . Arthritis    hands, hips  . BPH associated with nocturia   . DJD of right shoulder   . Essential hypertension 01/05/2015  . Hypertension   . Shingles 12/2017   Past Surgical History:  Procedure Laterality Date  . CATARACT EXTRACTION Left 2013  . RETINAL DETACHMENT SURGERY  2012  . REVERSE SHOULDER ARTHROPLASTY Right 04/24/2019   Procedure: RIGHT REVERSE SHOULDER ARTHROPLASTY;  Surgeon: Hiram Gash, MD;  Location: [facility];  Service: Orthopedics;  Laterality: Right;  . ROTATOR CUFF REPAIR Right 2010  . TONSILECTOMY, ADENOIDECTOMY, BILATERAL MYRINGOTOMY AND TUBES  1968  . VASECTOMY     Family History  Problem Relation Age of Onset  . Cancer Mother   . Diabetes Father   . Stroke Father   . Stroke Brother    Social History   Socioeconomic History  . Marital status: Single    Spouse name: Not on file  . Number of children: 3  . Years of education: 16  . Highest education level: Bachelor's degree (e.g., BA, AB, BS)  Occupational History  . Occupation: retired    Comment: communications  Tobacco Use  . Smoking status: Never Smoker  . Smokeless tobacco: Never Used  Substance and Sexual Activity  .  Alcohol use: Yes    Alcohol/week: 2.0 standard drinks    Types: 2 Cans of beer per week    Comment: occ  . Drug use: No  . Sexual activity: Yes  Other Topics Concern  . Not on file  Social History Narrative   Did work for [employer]. Caffeine 1 cup of coffee in the morning.   Social Determinants of Health   Financial Resource Strain:   . Difficulty of Paying Living Expenses: Not on file  Food Insecurity:   . Worried About Charity fundraiser in the Last Year: Not on file  . Ran Out of Food in the Last Year: Not on file  Transportation Needs:   . Lack of Transportation (Medical): Not on file  . Lack of Transportation (Non-Medical): Not on file  Physical Activity:   . Days of Exercise per Week: Not on file  . Minutes of  Exercise per Session: Not on file  Stress:   . Feeling of Stress : Not on file  Social Connections:   . Frequency of Communication with Friends and Family: Not on file  . Frequency of Social Gatherings with Friends and Family: Not on file  . Attends Religious Services: Not on file  . Active Member of Clubs or Organizations: Not on file  . Attends Archivist Meetings: Not on file  . Marital Status: Not on file    Outpatient Encounter Medications as of 05/20/2019  Medication Sig  . acetaminophen (TYLENOL) 500 MG tablet Take 500 mg by mouth every 6 (six) hours as needed.  . Ascorbic Acid (VITAMIN C PO) Take by mouth.  . Cholecalciferol (VITAMIN D-1000 MAX ST) 1000 units tablet Take by mouth.  . Cinnamon 500 MG capsule Take by mouth.  . doxazosin (CARDURA) 2 MG tablet Take 2 mg by mouth daily.  Marland Kitchen gabapentin (NEURONTIN) 100 MG capsule Take 1-3 capsules (100-300 mg total) by mouth at bedtime.  . hydrochlorothiazide (HYDRODIURIL) 12.5 MG tablet Take 1 tablet (12.5 mg total) by mouth daily. (Patient taking differently: Take 25 mg by mouth daily. )  . L-Lysine 1000 MG TABS Take by mouth.  . loratadine (CLARITIN) 10 MG tablet Take 10 mg by mouth daily.  Marland Kitchen losartan (COZAAR) 100 MG tablet TAKE ONE-HALF TABLET BY MOUTH EVERY DAY  . Melatonin 5 MG TABS Take by mouth.  . meloxicam (MOBIC) 7.5 MG tablet Take 1 tablet (7.5 mg total) by mouth 2 (two) times daily.  . valACYclovir (VALTREX) 1000 MG tablet TAKE 1/2 TABLET BY MOUTH DAILY AS NEEDED  . [EXPIRED] acetaminophen (TYLENOL) 500 MG tablet Take 2 tablets (1,000 mg total) by mouth every 8 (eight) hours for 14 days.  . vitamin E 400 UNIT capsule Take 400 Units by mouth 2 (two) times daily.   . [DISCONTINUED] losartan (COZAAR) 100 MG tablet TAKE 1/2 TAB BY MOUTH EVERY DAY  . [DISCONTINUED] valACYclovir (VALTREX) 1000 MG tablet TAKE 1/2 TABLET BY MOUTH DAILY AS NEEDED   No facility-administered encounter medications on file as of 05/20/2019.     Activities of Daily Living In your present state of health, do you have any difficulty performing the following activities: 05/20/2019 04/24/2019  Hearing? N N  Vision? N N  Difficulty concentrating or making decisions? N N  Walking or climbing stairs? N N  Dressing or bathing? N N  Doing errands, shopping? N -  Preparing Food and eating ? N -  Using the Toilet? N -  In the past six months,  have you accidently leaked urine? N -  Do you have problems with loss of bowel control? N -  Managing your Medications? N -  Managing your Finances? N -  Housekeeping or managing your Housekeeping? N -  Some recent data might be hidden    Patient Care Team: Emeterio Reeve, DO as PCP - General (Osteopathic Medicine)   Assessment:   This is a routine wellness examination for Antonnio.Physical assessment deferred to PCP.   Exercise Activities and Dietary recommendations Current Exercise Habits: The patient does not participate in regular exercise at present, Exercise limited by: None identified Diet  Eats a healthy diet of vegetables, fruits and proteins. Breakfast: Special K with cherries coffee Lunch:  skips Dinner:  Chicken or shrimp, vegetables   Home BP readings; 143/84 p=66 142/86 P=55 115/73 P= 67 98/60 P=72 125/79 P=73 108/63 P=71 115/72 P=68   Goals    . Exercise 3x per week (30 min per time)     Get out and walk 3 times a week for at least 30 minutes at a time. Increase as tolerated       Fall Risk Fall Risk  05/20/2019 02/20/2019 01/29/2018 01/24/2017 10/24/2016  Falls in the past year? 0 0 1 No No  Comment - Emmi Telephone Survey: data to providers prior to load - - Emmi Telephone Survey: data to providers prior to load  Number falls in past yr: - - 0 - -  Injury with Fall? - - 0 - -  Risk for fall due to : No Fall Risks - - - -  Follow up Falls prevention discussed - Falls prevention discussed - -   Is the patient's home free of loose throw rugs in walkways, pet  beds, electrical cords, etc?   yes      Grab bars in the bathroom? no      Handrails on the stairs?   yes      Adequate lighting?   yes  Depression Screen PHQ 2/9 Scores 05/20/2019 01/30/2019 01/29/2018 01/24/2017  PHQ - 2 Score 0 0 0 0    Cognitive Function     6CIT Screen 05/20/2019 01/29/2018  What Year? 0 points 0 points  What month? 0 points 0 points  What time? 0 points 0 points  Count back from 20 0 points 0 points  Months in reverse 0 points 0 points  Repeat phrase 0 points 0 points  Total Score 0 0    Immunization History  Administered Date(s) Administered  . Tdap 10/26/2015    Screening Tests Health Maintenance  Topic Date Due  . PNA vac Low Risk Adult (1 of 2 - PCV13) 07/15/2014  . COLONOSCOPY  03/29/2019  . INFLUENZA VACCINE  01/16/2020 (Originally 10/27/2018)  . TETANUS/TDAP  10/25/2025  . Hepatitis C Screening  Completed      Plan:    Please schedule your next medicare wellness visit with me in 1 yr.  Mr. Orser , Thank you for taking time to come for your Medicare Wellness Visit. I appreciate your ongoing commitment to your health goals. Please review the following plan we discussed and let me know if I can assist you in the future.  Continue doing brain stimulating activities (puzzles, reading, adult coloring books, staying active) to keep memory sharp.  Bring a copy of your living will and/or healthcare power of attorney to your next office visit.   These are the goals we discussed: Goals    . Exercise 3x per week (30  min per time)     Get out and walk 3 times a week for at least 30 minutes at a time. Increase as tolerated       This is a list of the screening recommended for you and due dates:  Health Maintenance  Topic Date Due  . Pneumonia vaccines (1 of 2 - PCV13) 07/15/2014  . Colon Cancer Screening  03/29/2019  . Flu Shot  01/16/2020*  . Tetanus Vaccine  10/25/2025  .  Hepatitis C: One time screening is recommended by Center for Disease  Control  (CDC) for  adults born from [birth year] through 1966.   Completed  *Topic was postponed. The date shown is not the original due date.     I have personally reviewed and noted the following in the patient's chart:   . Medical and social history . Use of alcohol, tobacco or illicit drugs  . Current medications and supplements . Functional ability and status . Nutritional status . Physical activity . Advanced directives . List of other physicians . Hospitalizations, surgeries, and ER visits in previous 12 months . Vitals . Screenings to include cognitive, depression, and falls . Referrals and appointments  In addition, I have reviewed and discussed with patient certain preventive protocols, quality metrics, and best practice recommendations. A written personalized care plan for preventive services as well as general preventive health recommendations were provided to patient.     Joanne Chars, LPN  [phone]

## 2019-05-09 ENCOUNTER — Other Ambulatory Visit: Payer: Self-pay

## 2019-05-09 ENCOUNTER — Other Ambulatory Visit: Payer: Self-pay | Admitting: Family Medicine

## 2019-05-09 DIAGNOSIS — I10 Essential (primary) hypertension: Secondary | ICD-10-CM

## 2019-05-09 MED ORDER — VALACYCLOVIR HCL 1 G PO TABS: ORAL_TABLET | ORAL | 1 refills | Status: DC

## 2019-05-13 ENCOUNTER — Ambulatory Visit (INDEPENDENT_AMBULATORY_CARE_PROVIDER_SITE_OTHER): Payer: Medicare Other | Admitting: Rehabilitative and Restorative Service Providers"

## 2019-05-13 ENCOUNTER — Other Ambulatory Visit: Payer: Self-pay

## 2019-05-13 ENCOUNTER — Encounter: Payer: Self-pay | Admitting: Rehabilitative and Restorative Service Providers"

## 2019-05-13 DIAGNOSIS — R600 Localized edema: Secondary | ICD-10-CM

## 2019-05-13 DIAGNOSIS — M25511 Pain in right shoulder: Secondary | ICD-10-CM | POA: Diagnosis not present

## 2019-05-13 DIAGNOSIS — M25611 Stiffness of right shoulder, not elsewhere classified: Secondary | ICD-10-CM | POA: Diagnosis not present

## 2019-05-13 DIAGNOSIS — M6281 Muscle weakness (generalized): Secondary | ICD-10-CM | POA: Diagnosis not present

## 2019-05-13 DIAGNOSIS — R293 Abnormal posture: Secondary | ICD-10-CM

## 2019-05-13 NOTE — Addendum Note (Signed)
Addended by: Everardo All on: 05/13/2019 01:09 PM   Modules accepted: Orders

## 2019-05-13 NOTE — Therapy (Signed)
[place], [place], [address] Phone: [phone]   Fax:  [phone]  Physical Therapy Evaluation  Patient Details  Name: Billy Landry MRN: [id number] Date of Birth: 12-07-1950 Referring Provider (PT): Dr Ophelia Charter    Encounter Date: 05/13/2019  PT End of Session - 05/13/19 1228    Visit Number  1    Number of Visits  24    Date for PT Re-Evaluation  08/05/19    Authorization - Visit Number  1    Authorization - Number of Visits  24    PT Start Time  0846    PT Stop Time  0932    PT Time Calculation (min)  46 min    Activity Tolerance  Patient tolerated treatment well    Behavior During Therapy  [facility] for tasks assessed/performed       Past Medical History:  Diagnosis Date  . Allergy   . Arthritis    hands, hips  . BPH associated with nocturia   . DJD of right shoulder   . Essential hypertension 01/05/2015  . Hypertension   . Shingles 12/2017    Past Surgical History:  Procedure Laterality Date  . CATARACT EXTRACTION Left 2013  . RETINAL DETACHMENT SURGERY  2012  . REVERSE SHOULDER ARTHROPLASTY Right 04/24/2019   Procedure: RIGHT REVERSE SHOULDER ARTHROPLASTY;  Surgeon: Hiram Gash, MD;  Location: [facility];  Service: Orthopedics;  Laterality: Right;  . ROTATOR CUFF REPAIR Right 2010  . TONSILECTOMY, ADENOIDECTOMY, BILATERAL MYRINGOTOMY AND TUBES  1968  . VASECTOMY      There were no vitals filed for this visit.   Subjective Assessment - 05/13/19 0858    Subjective  Patient reports that he had Rt RCR ~ 10 years ago with good results until ~ 6-8 months ago when he began to hurt. Patient was seen by Dr Dianah Field and received injection with minimal improvement. Underwent Rt reverse total shoulder replacement 04/24/19 with no post op complication.    Pertinent History  Rt RCR ~ 10 yrs ago, detached retina years ago; arthritis hands; HTN    Patient Stated Goals   full use of arm - golfing; tennis; household chores/activities    Currently in Pain?  Yes    Pain Score  1     Pain Location  Shoulder    Pain Orientation  Right    Pain Descriptors / Indicators  Dull;Aching    Pain Type  Surgical pain    Pain Radiating Towards  none    Pain Onset  More than a month ago    Pain Frequency  Intermittent    Aggravating Factors   movement; certain positions    Pain Relieving Factors  holding arm in adducted and supported position         [facility] PT Assessment - 05/13/19 0001      Assessment   Medical Diagnosis  Rt reverse total shoulder     Referring Provider (PT)  Dr Ophelia Charter     Onset Date/Surgical Date  04/24/19    Hand Dominance  Right    Next MD Visit  05/30/19    Prior Therapy  after RCR       Precautions   Precaution Comments  post op per protocol       Balance Screen   Has the patient fallen in the past 6 months  No    Has the patient had a decrease in activity  level because of a fear of falling?   No    Is the patient reluctant to leave their home because of a fear of falling?   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     Leisure  household chores; golfing (none in the past several months); tennins yrs ago       Cognition   Overall Cognitive Status  Within Functional Limits for tasks assessed      Sensation   Additional Comments  WFL's per pt report       Posture/Postural Control   Posture Comments  head forward; shoulders rounded; scapulae abducted       PROM   Overall PROM Comments  assessed in supine     Right Shoulder Flexion   89 Degrees    Right Shoulder ABduction  83 Degrees    Right Shoulder External Rotation  27 Degrees      Palpation   Palpation comment  muscular tightness Lt upper quarter through pecs; upper trap; leveator; periscapular musculature       Transfers   Comments  independent in sit to supine and supine to sit                 Objective measurements completed on examination: See above findings.              PT Education - 05/13/19 1227    Education Details  shoulder rehab process; initial scapular stabilization pulling shoulder blades down and back; sitting w/ use of noodle    Person(s) Educated  Patient    Methods  Explanation;Demonstration;Tactile cues;Verbal cues    Comprehension  Verbalized understanding;Returned demonstration;Verbal cues required;Tactile cues required;Need further instruction       PT Short Term Goals - 05/13/19 1243      PT SHORT TERM GOAL #1   Title  Improve posture and alignment with patient to demonstrate upright posture with posterior shoulder girdle engaged    Time  6    Period  Weeks    Status  New    Target Date  06/24/19      PT SHORT TERM GOAL #2   Title  Increase PROM to 120 deg flexion and scaption; 30+ degrees ER in neutral - progressing per protocol    Time  6    Period  Weeks    Status  New    Target Date  06/24/19      PT SHORT TERM GOAL #3   Title  independent in initial HEP    Time  6    Period  Weeks    Status  New    Target Date  06/24/19      PT SHORT TERM GOAL #4   Title  pain free at rest with no more that 2-3/10 pain following exercise    Time  6    Period  Weeks    Status  New    Target Date  06/24/19        PT Long Term Goals -  05/13/19 1247      PT LONG TERM GOAL #1   Title  Independent in advanced HEP    Time  12    Period  Weeks    Status  New    Target Date  08/05/19      PT LONG TERM GOAL #2   Title  Functional use and independent in all ADL's    Time  12    Period  Weeks    Status   New    Target Date  08/05/19      PT LONG TERM GOAL #3   Title  AROM Rt shoulder WFL's and per protocol    Time  12    Period  Weeks    Status  New    Target Date  08/05/19      PT LONG TERM GOAL #4   Title  3/5 to 4+/5 strength Rt shoulder    Time  12    Period  Weeks    Status  New    Target Date  08/05/19      PT LONG TERM GOAL #5   Title  Plan to return to work - lifting and carrying objects weighing 25-35 pounds    Time  12    Period  Weeks    Status  New    Target Date  08/05/19             Plan - 05/13/19 1233    Clinical Impression Statement  Patient presents s/p Rt reverse total shoulder replacement 04/24/19 following gradual onset of shoulder pain in the past 6-8 months. He has had an uncomplicated post op course. Patient did experience some dizziness during evaluation history. Sympotms resolved whe pt was supine with LE's elevated. He does not have a history of dizziness.    Personal Factors and Comorbidities  Age    Examination-Activity Limitations  Bathing;Carry;Dressing;Hygiene/Grooming;Lift;Reach Overhead    Examination-Participation Restrictions  Cleaning;Community Activity;Meal Prep;Yard Work;Other    Stability/Clinical Decision Making  Stable/Uncomplicated    Clinical Decision Making  Low    Rehab Potential  Excellent    PT Frequency  2x / week    PT Duration  12 weeks    PT Treatment/Interventions  ADLs/Self Care Home Management;Cryotherapy;Electrical Stimulation;Iontophoresis 4mg /ml Dexamethasone;Moist Heat;Ultrasound;Therapeutic activities;Therapeutic exercise;Neuromuscular re-education;Patient/family education;Manual techniques;Passive range of motion;Dry needling;Taping    PT Next Visit Plan  Progress with appropriate exercise per protocol as patient tolerates; work on posterior shoulder girdle strengthening and postural awareness; manual work and PROM Rt shoulder. Modalities as indicated.    Consulted and Agree with Plan of Care  Patient        Patient will benefit from skilled therapeutic intervention in order to improve the following deficits and impairments:  Decreased range of motion, Increased fascial restricitons, Impaired UE functional use, Decreased activity tolerance, Pain, Improper body mechanics, Decreased strength, Postural dysfunction  Visit Diagnosis: Acute pain of right shoulder  Muscle weakness (generalized)  Stiffness of right shoulder, not elsewhere classified  Abnormal posture     Problem List Patient Active Problem List   Diagnosis Date Noted  . Rotator cuff tear arthropathy, right 02/13/2019  . 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 Skin problem 02/17/2016  . Macrocytosis without anemia 02/17/2016  . History of skin cancer 10/26/2015  . Essential hypertension 01/05/2015    Nile Prisk Nilda Simmer PT, MPH  05/13/2019, 12:52 PM  [facility] [place], [place], [address] Phone:  [phone]   Fax:  [phone]  Name: Billy Landry MRN: [id number] Date of Birth: 12-15-1950

## 2019-05-15 ENCOUNTER — Encounter: Payer: Self-pay | Admitting: Physical Therapy

## 2019-05-15 ENCOUNTER — Ambulatory Visit (INDEPENDENT_AMBULATORY_CARE_PROVIDER_SITE_OTHER): Payer: Medicare Other | Admitting: Physical Therapy

## 2019-05-15 ENCOUNTER — Other Ambulatory Visit: Payer: Self-pay

## 2019-05-15 VITALS — BP 135/72 | HR 68

## 2019-05-15 DIAGNOSIS — M25511 Pain in right shoulder: Secondary | ICD-10-CM | POA: Diagnosis not present

## 2019-05-15 DIAGNOSIS — R293 Abnormal posture: Secondary | ICD-10-CM

## 2019-05-15 DIAGNOSIS — M25611 Stiffness of right shoulder, not elsewhere classified: Secondary | ICD-10-CM

## 2019-05-15 DIAGNOSIS — M6281 Muscle weakness (generalized): Secondary | ICD-10-CM | POA: Diagnosis not present

## 2019-05-15 NOTE — Patient Instructions (Signed)
Access Code: [email or web address]  URL: [email or web address]  Date: 05/15/2019  Prepared by: Kerin Perna   Exercises  Standing Cervical Sidebending AROM - 3 reps - 1 sets - 15 hold - 3x daily - 7x weekly  Neck Rotation - 3 reps - 1 sets - 15 hold - 3x daily - 7x weekly  Standing Elbow Flexion Extension AROM - 10 reps - 1 sets - 2x daily - 7x weekly  Circular Shoulder Pendulum with Table Support - 5 reps - 1 sets - 3x daily - 7x weekly  Wrist Flexion Extension AROM - Palms Down - 10 reps - 1 sets - 2x daily - 7x weekly  Putty Squeezes - 10 reps - 1 sets - 5 hold - 2x daily - 7x weekly  Standing Backward Shoulder Rolls - 10 reps - 3 sets - 1x daily - 7x weekly  Standing Scapular Retraction - 5 reps - 1 sets - 5 hold - 3x daily - 7x weekly  Isometric Shoulder Abduction at Wall - 10 reps - 1 sets - 5 hold - 1-2x daily - 7x weekly

## 2019-05-15 NOTE — Therapy (Signed)
[place], [place], [address] Phone: [phone]   Fax:  [phone]  Physical Therapy Treatment  Patient Details  Name: Billy Landry MRN: [id number] Date of Birth: 17-Dec-1950 Referring Provider (PT): Dr Ophelia Charter    Encounter Date: 05/15/2019  PT End of Session - 05/15/19 1602    Visit Number  2    Number of Visits  24    Date for PT Re-Evaluation  08/05/19    Authorization - Visit Number  2    Authorization - Number of Visits  24    PT Start Time  [id number]    PT Stop Time  [id number]    PT Time Calculation (min)  50 min    Activity Tolerance  Patient tolerated treatment well;No increased pain    Behavior During Therapy  WFL for tasks assessed/performed       Past Medical History:  Diagnosis Date  . Allergy   . Arthritis    hands, hips  . BPH associated with nocturia   . DJD of right shoulder   . Essential hypertension 01/05/2015  . Hypertension   . Shingles 12/2017    Past Surgical History:  Procedure Laterality Date  . CATARACT EXTRACTION Left 2013  . RETINAL DETACHMENT SURGERY  2012  . REVERSE SHOULDER ARTHROPLASTY Right 04/24/2019   Procedure: RIGHT REVERSE SHOULDER ARTHROPLASTY;  Surgeon: Hiram Gash, MD;  Location: [facility] [facility];  Service: Orthopedics;  Laterality: Right;  . ROTATOR CUFF REPAIR Right 2010  . TONSILECTOMY, ADENOIDECTOMY, BILATERAL MYRINGOTOMY AND TUBES  1968  . VASECTOMY      Vitals:   05/15/19 1609 05/15/19 1622  BP: 128/83 135/72  Pulse: 64 68        [facility] PT Assessment - 05/15/19 0001      Assessment   Medical Diagnosis  Rt reverse total shoulder     Referring Provider (PT)  Dr Ophelia Charter     Onset Date/Surgical Date  04/24/19    Hand Dominance  Right    Next MD Visit  05/30/19    Prior Therapy  after RCR        [facility] Adult PT Treatment/Exercise - 05/15/19 0001      Self-Care   Self-Care  Other Self-Care Comments;Scar Mobilizations     Scar Mobilizations  Pt educated in self scar mobilization; pt returned demo with cues.     Other Self-Care Comments   discussed rehab protocol and precautions with pt; pt verbalized understanding.       Elbow Exercises   Elbow Flexion  AROM;Right;5 reps      Neck Exercises: Seated   Cervical Rotation  Right;Left;5 reps      Shoulder Exercises: Standing   Other Standing Exercises  pt instructed in Rt shoulder isometric and pendulum; pt verbalized understanding.     Other Standing Exercises  shoulder rolls x 10 each direction (mirror for feedback on symmetry.  Scap retraction x 5 sec hold x 10      Wrist Exercises   Wrist Flexion  AROM;Right;5 reps    Wrist Extension  AROM;Right;5 reps      Modalities   Modalities  Vasopneumatic      Vasopneumatic   Number Minutes Vasopneumatic   10 minutes    Vasopnuematic Location   Shoulder   Rt   Vasopneumatic Pressure  Low    Vasopneumatic Temperature   34 deg      Manual Therapy   Manual Therapy  Soft tissue mobilization;Passive ROM    Manual therapy comments  Pt seated for IASTM and supine for PROM.     Soft tissue mobilization  IASTM to Rt upper trap, cervical paraspinals, rhomboid and pec to decrease fascial restrictions     Passive ROM  Rt shoulder flexion, scaption, and ER (in neutral position) - to tissue limits and no pain.       Neck Exercises: Stretches   Upper Trapezius Stretch  Right;Left;2 reps;20 seconds             PT Education - 05/15/19 1649    Education Details  HEP    Person(s) Educated  Patient    Methods  Explanation;Handout;Verbal cues;Demonstration;Tactile cues    Comprehension  Verbalized understanding;Returned demonstration       PT Short Term Goals - 05/13/19 1243      PT SHORT TERM GOAL #1   Title  Improve posture and alignment with patient to demonstrate upright posture with posterior shoulder girdle engaged    Time  6    Period  Weeks    Status  New    Target Date  06/24/19      PT SHORT  TERM GOAL #2   Title  Increase PROM to 120 deg flexion and scaption; 30+ degrees ER in neutral - progressing per protocol    Time  6    Period  Weeks    Status  New    Target Date  06/24/19      PT SHORT TERM GOAL #3   Title  independent in initial HEP    Time  6    Period  Weeks    Status  New    Target Date  06/24/19      PT SHORT TERM GOAL #4   Title  pain free at rest with no more that 2-3/10 pain following exercise    Time  6    Period  Weeks    Status  New    Target Date  06/24/19        PT Long Term Goals - 05/13/19 1247      PT LONG TERM GOAL #1   Title  Independent in advanced HEP    Time  12    Period  Weeks    Status  New    Target Date  07/17/19      PT LONG TERM GOAL #2   Title  Functional use and independent in all ADL's    Time  12    Period  Weeks    Status  New    Target Date  07/17/19      PT LONG TERM GOAL #3   Title  AROM Rt shoulder WFL's and per protocol    Time  12    Period  Weeks    Status  New    Target Date  07/17/19      PT LONG TERM GOAL #4   Title  4/5 to 4+/5 strength Rt shoulder    Time  12    Period  Weeks    Status  New    Target Date  07/17/19      PT LONG TERM GOAL #5   Title  Plan to return to work - lifting and carrying objects weighing 25-35 pounds    Time  12    Period  Weeks    Status  New    Target Date  07/17/19  Plan - 05/15/19 1651    Clinical Impression Statement  Pt tolerated PROM well with minimal guarding.  All exercises went well, without pain.  Pt had one episode of dizziness after standing completing scap retraction; resolved after time spent in supine with legs elevated.  BP WNL.  Pt did report he had only had a cup of coffee to drink today at 8am, no other fluids; encouraged pt to set time on phone to remind him to hydrate.  Progressing towards goals.    Personal Factors and Comorbidities  Age    Examination-Activity Limitations  Bathing;Carry;Dressing;Hygiene/Grooming;Lift;Reach  Overhead    Examination-Participation Restrictions  Cleaning;Community Activity;Meal Prep;Yard Work;Other    Stability/Clinical Decision Making  Stable/Uncomplicated    Rehab Potential  Excellent    PT Frequency  2x / week    PT Duration  12 weeks    PT Treatment/Interventions  ADLs/Self Care Home Management;Cryotherapy;Electrical Stimulation;Iontophoresis 4mg /ml Dexamethasone;Moist Heat;Ultrasound;Therapeutic activities;Therapeutic exercise;Neuromuscular re-education;Patient/family education;Manual techniques;Passive range of motion;Dry needling;Taping    PT Next Visit Plan  Progress with appropriate exercise per protocol as patient tolerates; work on posterior shoulder girdle strengthening and postural awareness; manual work and PROM Rt shoulder. Modalities as indicated.    Consulted and Agree with Plan of Care  Patient       Patient will benefit from skilled therapeutic intervention in order to improve the following deficits and impairments:  Decreased range of motion, Increased fascial restricitons, Impaired UE functional use, Decreased activity tolerance, Pain, Improper body mechanics, Decreased strength, Postural dysfunction  Visit Diagnosis: Acute pain of right shoulder  Muscle weakness (generalized)  Stiffness of right shoulder, not elsewhere classified  Abnormal posture     Problem List Patient Active Problem List   Diagnosis Date Noted  . Rotator cuff tear arthropathy, right 02/13/2019  . 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 Skin problem 02/17/2016  . Macrocytosis without anemia 02/17/2016  . History of skin cancer 10/26/2015  . Essential hypertension 01/05/2015   Kerin Perna, PTA 05/15/19 5:04 PM  [place], [place], [address] Phone: [phone]   Fax:  [phone]  Name: Billy Landry MRN: [id number] Date of Birth: 19-Jun-1950

## 2019-05-20 ENCOUNTER — Other Ambulatory Visit: Payer: Self-pay

## 2019-05-20 ENCOUNTER — Encounter: Payer: Self-pay | Admitting: Rehabilitative and Restorative Service Providers"

## 2019-05-20 ENCOUNTER — Ambulatory Visit (INDEPENDENT_AMBULATORY_CARE_PROVIDER_SITE_OTHER): Payer: Medicare Other | Admitting: *Deleted

## 2019-05-20 ENCOUNTER — Ambulatory Visit (INDEPENDENT_AMBULATORY_CARE_PROVIDER_SITE_OTHER): Payer: Medicare Other | Admitting: Rehabilitative and Restorative Service Providers"

## 2019-05-20 ENCOUNTER — Encounter: Payer: Self-pay | Admitting: Osteopathic Medicine

## 2019-05-20 VITALS — BP 143/84 | HR 66 | Ht 70.0 in | Wt 175.0 lb

## 2019-05-20 DIAGNOSIS — R600 Localized edema: Secondary | ICD-10-CM

## 2019-05-20 DIAGNOSIS — M6281 Muscle weakness (generalized): Secondary | ICD-10-CM | POA: Diagnosis not present

## 2019-05-20 DIAGNOSIS — Z0000 Encounter for general adult medical examination without abnormal findings: Secondary | ICD-10-CM

## 2019-05-20 DIAGNOSIS — M25511 Pain in right shoulder: Secondary | ICD-10-CM | POA: Diagnosis not present

## 2019-05-20 DIAGNOSIS — R293 Abnormal posture: Secondary | ICD-10-CM

## 2019-05-20 DIAGNOSIS — M25611 Stiffness of right shoulder, not elsewhere classified: Secondary | ICD-10-CM

## 2019-05-20 NOTE — Patient Instructions (Addendum)
   Squeeze shoulder blades down and back - can use noodle  10 sec x 10 reps several times per day   Table slide  10 sec hold x 5-10  2 times per day    Pendulum 30 CW/30CCW  2-3 times per day - or needed  Chin tuck  10 sec x 5 reps   Posterior shoulder rolls   Press down into bed in sitting  10 sec hold x 10 - focus on shoulder blades   Standing broom handle gently pushing arm to side with elbow bent at 90 deg  10 sec hold x 5 reps 2 times a day   Scar Tissue Massage    Place pad of fingertip on scar area. Apply steady downward pressure while moving in circular fashion. Use another fin-ger on top to assist. Repeat until entire scar has been covered. May use vitamin E oil for scar massage  5 minutes. Do __1-2__ sessions per day.

## 2019-05-20 NOTE — Patient Instructions (Addendum)
Please schedule your next medicare wellness visit with me in 1 yr.  Billy Landry , Thank you for taking time to come for your Medicare Wellness Visit. I appreciate your ongoing commitment to your health goals. Please review the following plan we discussed and let me know if I can assist you in the future.  Continue doing brain stimulating activities (puzzles, reading, adult coloring books, staying active) to keep memory sharp.  Bring a copy of your living will and/or healthcare power of attorney to your next office visit.  These are the goals we discussed: Goals    . Exercise 3x per week (30 min per time)     Get out and walk 3 times a week for at least 30 minutes at a time. Increase as tolerated

## 2019-05-20 NOTE — Therapy (Signed)
[place], [place], [address] Phone: [phone]   Fax:  [phone]  Physical Therapy Treatment  Patient Details  Name: Billy Landry MRN: [id number] Date of Birth: [birth year] Referring Provider (PT): Dr Ophelia Charter    Encounter Date: 05/20/2019  PT End of Session - 05/20/19 1024    Visit Number  3    Number of Visits  24    Authorization - Visit Number  3    Authorization - Number of Visits  24    PT Start Time  [id number]    PT Stop Time  1117    PT Time Calculation (min)  59 min    Activity Tolerance  Patient tolerated treatment well       Past Medical History:  Diagnosis Date  . Allergy   . Arthritis    hands, hips  . BPH associated with nocturia   . DJD of right shoulder   . Essential hypertension 01/05/2015  . Hypertension   . Shingles 12/2017    Past Surgical History:  Procedure Laterality Date  . CATARACT EXTRACTION Left 2013  . RETINAL DETACHMENT SURGERY  2012  . REVERSE SHOULDER ARTHROPLASTY Right 04/24/2019   Procedure: RIGHT REVERSE SHOULDER ARTHROPLASTY;  Surgeon: Hiram Gash, MD;  Location: [facility];  Service: Orthopedics;  Laterality: Right;  . ROTATOR CUFF REPAIR Right 2010  . TONSILECTOMY, ADENOIDECTOMY, BILATERAL MYRINGOTOMY AND TUBES  1968  . VASECTOMY      There were no vitals filed for this visit.  Subjective Assessment - 05/20/19 1025    Subjective  No pain - working on exercises at home. Sleeps OK some nights less so other nights    Currently in Pain?  No/denies         [facility] PT Assessment - 05/20/19 0001      Assessment   Medical Diagnosis  Rt reverse total shoulder     Referring Provider (PT)  Dr Ophelia Charter     Onset Date/Surgical Date  04/24/19    Hand Dominance  Right    Next MD Visit  05/23/19    Prior Therapy  after RCR       PROM   Overall PROM Comments  assessed in supine     Right Shoulder Flexion  120 Degrees    Right  Shoulder External Rotation  24 Degrees   in neutral      Palpation   Palpation comment  muscular tightness Lt upper quarter through pecs; upper trap; leveator; periscapular musculature                    [facility] Adult PT Treatment/Exercise - 05/20/19 0001      Elbow Exercises   Elbow Flexion  AROM;Right;5 reps   add 1 # wt in supine      Shoulder Exercises: Seated   Flexion  AAROM;Right;10 reps   table slide 10 sec hold    Other Seated Exercises  scapular depression pressing into large therapy ball at side 10 sec hold x 10       Shoulder Exercises: Standing   Other Standing Exercises  scap squeeze with noodle along spine 10 sec x 10 reps     Other Standing Exercises  posterior shoulder rolls x 10       Shoulder Exercises: ROM/Strengthening   Pendulum  20 CW/20CCW      Shoulder Exercises: Isometric Strengthening   ABduction  5X5"  Shoulder Exercises: Stretch   Table Stretch - Flexion  10 seconds   10 reps      Wrist Exercises   Wrist Flexion  AROM;Right;5 reps    Wrist Extension  AROM;Right;5 reps    Other wrist exercises  grip at home with ball       Modalities   Modalities  Vasopneumatic      Vasopneumatic   Number Minutes Vasopneumatic   10 minutes    Vasopnuematic Location   Shoulder   Rt   Vasopneumatic Pressure  Low    Vasopneumatic Temperature   34 deg      Manual Therapy   Manual Therapy  Soft tissue mobilization;Passive ROM    Manual therapy comments  pt supine    Soft tissue mobilization  soft tissue work through Rt shoulder girdle through pecs/upper trap/deltoid/teres     Passive ROM  Rt shoulder flexion, scaption, and ER (in neutral position) - to tissue limits and no pain; pt supine              PT Education - 05/20/19 1119    Education Details  HEP; scar massage; post op precautions; rehab process    Person(s) Educated  Patient    Methods  Explanation;Demonstration;Tactile cues;Verbal cues;Handout    Comprehension  Verbalized  understanding;Returned demonstration;Verbal cues required;Tactile cues required       PT Short Term Goals - 05/13/19 1243      PT SHORT TERM GOAL #1   Title  Improve posture and alignment with patient to demonstrate upright posture with posterior shoulder girdle engaged    Time  6    Period  Weeks    Status  New    Target Date  06/24/19      PT SHORT TERM GOAL #2   Title  Increase PROM to 120 deg flexion and scaption; 30+ degrees ER in neutral - progressing per protocol    Time  6    Period  Weeks    Status  New    Target Date  06/24/19      PT SHORT TERM GOAL #3   Title  independent in initial HEP    Time  6    Period  Weeks    Status  New    Target Date  06/24/19      PT SHORT TERM GOAL #4   Title  pain free at rest with no more that 2-3/10 pain following exercise    Time  6    Period  Weeks    Status  New    Target Date  06/24/19        PT Long Term Goals - 05/13/19 1247      PT LONG TERM GOAL #1   Title  Independent in advanced HEP    Time  12    Period  Weeks    Status  New    Target Date  07/17/19      PT LONG TERM GOAL #2   Title  Functional use and independent in all ADL's    Time  12    Period  Weeks    Status  New    Target Date  07/17/19      PT LONG TERM GOAL #3   Title  AROM Rt shoulder WFL's and per protocol    Time  12    Period  Weeks    Status  New    Target Date  07/17/19  PT LONG TERM GOAL #4   Title  4/5 to 4+/5 strength Rt shoulder    Time  12    Period  Weeks    Status  New    Target Date  07/17/19      PT LONG TERM GOAL #5   Title  Plan to return to work - lifting and carrying objects weighing 25-35 pounds    Time  12    Period  Weeks    Status  New    Target Date  07/17/19            Plan - 05/20/19 1133    Clinical Impression Statement  Patient is progressing well with shoulder rehab post Rt reverse total shoulder 04/24/19. PROM is increasing. Pt has minimal to no pain. He is working on his HEP consistently.  He does reports some intermittent problems with dizziness and low blood pressure which he has addressed by changing his blood pressure medicine. Patient was encouraged to contact his MD before changing or altering his medication for HTN.    Rehab Potential  Excellent    PT Frequency  2x / week    PT Duration  12 weeks    PT Treatment/Interventions  ADLs/Self Care Home Management;Cryotherapy;Electrical Stimulation;Iontophoresis 4mg /ml Dexamethasone;Moist Heat;Ultrasound;Therapeutic activities;Therapeutic exercise;Neuromuscular re-education;Patient/family education;Manual techniques;Passive range of motion;Dry needling;Taping    PT Next Visit Plan  Progress with appropriate exercise per protocol as patient tolerates; work on posterior shoulder girdle strengthening and postural awareness; manual work and PROM Rt shoulder. Modalities as indicated.    Consulted and Agree with Plan of Care  Patient       Patient will benefit from skilled therapeutic intervention in order to improve the following deficits and impairments:     Visit Diagnosis: Acute pain of right shoulder  Muscle weakness (generalized)  Stiffness of right shoulder, not elsewhere classified  Abnormal posture  Localized edema     Problem List Patient Active Problem List   Diagnosis Date Noted  . Rotator cuff tear arthropathy, right 02/13/2019  . 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 Skin problem 02/17/2016  . Macrocytosis without anemia 02/17/2016  . History of skin cancer 10/26/2015  . Essential hypertension 01/05/2015    Blinda Turek Nilda Simmer PT, MPH  05/20/2019, 11:38 AM  [facility] [place], [place], [address] Phone: [phone]   Fax:  [phone]  Name: Billy Landry MRN: [id number] Date of Birth: 11-09-1950

## 2019-05-23 ENCOUNTER — Ambulatory Visit (INDEPENDENT_AMBULATORY_CARE_PROVIDER_SITE_OTHER): Payer: Medicare Other | Admitting: Physical Therapy

## 2019-05-23 ENCOUNTER — Encounter: Payer: Medicare Other | Admitting: Rehabilitative and Restorative Service Providers"

## 2019-05-23 ENCOUNTER — Other Ambulatory Visit: Payer: Self-pay

## 2019-05-23 ENCOUNTER — Encounter: Payer: Self-pay | Admitting: Physical Therapy

## 2019-05-23 DIAGNOSIS — M19011 Primary osteoarthritis, right shoulder: Secondary | ICD-10-CM | POA: Diagnosis not present

## 2019-05-23 DIAGNOSIS — M6281 Muscle weakness (generalized): Secondary | ICD-10-CM

## 2019-05-23 DIAGNOSIS — R293 Abnormal posture: Secondary | ICD-10-CM

## 2019-05-23 DIAGNOSIS — M25511 Pain in right shoulder: Secondary | ICD-10-CM

## 2019-05-23 DIAGNOSIS — M25611 Stiffness of right shoulder, not elsewhere classified: Secondary | ICD-10-CM | POA: Diagnosis not present

## 2019-05-23 NOTE — Therapy (Signed)
[place], [place], [address] Phone: [phone]   Fax:  [phone]  Physical Therapy Treatment  Patient Details  Name: Billy Landry MRN: [id number] Date of Birth: Mar 15, 1951 Referring Provider (PT): Dr Ophelia Charter    Encounter Date: 05/23/2019  PT End of Session - 05/23/19 1543    Visit Number  4    Number of Visits  24    Authorization - Visit Number  4    Authorization - Number of Visits  24    PT Start Time  [id number]    PT Stop Time  [id number]    PT Time Calculation (min)  38 min    Activity Tolerance  Patient tolerated treatment well    Behavior During Therapy  [facility] for tasks assessed/performed       Past Medical History:  Diagnosis Date  . Allergy   . Arthritis    hands, hips  . BPH associated with nocturia   . DJD of right shoulder   . Essential hypertension 01/05/2015  . Hypertension   . Shingles 12/2017    Past Surgical History:  Procedure Laterality Date  . CATARACT EXTRACTION Left 2013  . RETINAL DETACHMENT SURGERY  2012  . REVERSE SHOULDER ARTHROPLASTY Right 04/24/2019   Procedure: RIGHT REVERSE SHOULDER ARTHROPLASTY;  Surgeon: Hiram Gash, MD;  Location: [facility];  Service: Orthopedics;  Laterality: Right;  . ROTATOR CUFF REPAIR Right 2010  . TONSILECTOMY, ADENOIDECTOMY, BILATERAL MYRINGOTOMY AND TUBES  1968  . VASECTOMY      There were no vitals filed for this visit.  Subjective Assessment - 05/23/19 1550    Subjective  Pt reports he saw the MD today; he said he was given a 5# lift restriction, but can lift arm to front, and to side but not behind him.  He returns to him 07/18/19.    Pertinent History  Rt RCR ~ 10 yrs ago, detached retina years ago; arthritis hands; HTN    Patient Stated Goals  full use of arm - golfing; tennis; household chores/activities    Currently in Pain?  No/denies    Pain Score  0-No pain         [facility] PT Assessment - 05/23/19  0001      Assessment   Medical Diagnosis  Rt reverse total shoulder     Referring Provider (PT)  Dr Ophelia Charter     Onset Date/Surgical Date  04/24/19    Hand Dominance  Right    Next MD Visit  05/23/19    Prior Therapy  after RCR        [facility] Adult PT Treatment/Exercise - 05/23/19 0001      Self-Care   Other Self-Care Comments   reviewed rehab protocol and precautions with pt; pt verbalized understanding.       Shoulder Exercises: Standing   Other Standing Exercises  scap squeeze with noodle along spine 10 sec x 10 reps     Other Standing Exercises  posterior shoulder rolls x 10       Shoulder Exercises: Isometric Strengthening   ABduction  --   10 reps x 5 sec holds, cues for form     Shoulder Exercises: Stretch   Table Stretch - Flexion  10 seconds   10 reps    Table Stretch -Flexion Limitations  shown version with arms on counter, walking back as well as reviewed table slide with rolling table    Table Stretch -  ABduction Limitations  trial in scaption plane x 2 reps of 5 sec (very limited motion)      Wrist Exercises   Wrist Flexion  AROM;Right;5 reps    Wrist Extension  AROM;Right;5 reps      Modalities   Modalities  Vasopneumatic      Vasopneumatic   Number Minutes Vasopneumatic   10 minutes    Vasopnuematic Location   Shoulder   Rt   Vasopneumatic Pressure  Low    Vasopneumatic Temperature   34 deg      Manual Therapy   Manual therapy comments  Pt seated for IASTM and supine for PROM.     Soft tissue mobilization  IASTM to Rt upper trap, cervical paraspinals, rhomboid and pec to decrease fascial restrictions     Passive ROM  Rt shoulder flexion, scaption, and ER (in neutral position) - to tissue limits and no pain.                PT Short Term Goals - 05/13/19 1243      PT SHORT TERM GOAL #1   Title  Improve posture and alignment with patient to demonstrate upright posture with posterior shoulder girdle engaged    Time  6    Period  Weeks     Status  New    Target Date  06/24/19      PT SHORT TERM GOAL #2   Title  Increase PROM to 120 deg flexion and scaption; 30+ degrees ER in neutral - progressing per protocol    Time  6    Period  Weeks    Status  New    Target Date  06/24/19      PT SHORT TERM GOAL #3   Title  independent in initial HEP    Time  6    Period  Weeks    Status  New    Target Date  06/24/19      PT SHORT TERM GOAL #4   Title  pain free at rest with no more that 2-3/10 pain following exercise    Time  6    Period  Weeks    Status  New    Target Date  06/24/19        PT Long Term Goals - 05/13/19 1247      PT LONG TERM GOAL #1   Title  Independent in advanced HEP    Time  12    Period  Weeks    Status  New    Target Date  07/17/19      PT LONG TERM GOAL #2   Title  Functional use and independent in all ADL's    Time  12    Period  Weeks    Status  New    Target Date  07/17/19      PT LONG TERM GOAL #3   Title  AROM Rt shoulder WFL's and per protocol    Time  12    Period  Weeks    Status  New    Target Date  07/17/19      PT LONG TERM GOAL #4   Title  4/5 to 4+/5 strength Rt shoulder    Time  12    Period  Weeks    Status  New    Target Date  07/17/19      PT LONG TERM GOAL #5   Title  Plan to return to work - lifting and carrying  objects weighing 25-35 pounds    Time  12    Period  Weeks    Status  New    Target Date  07/17/19            Plan - 05/23/19 1751    Clinical Impression Statement  Pt tolerated PROM with less guarding today; range within tissue limits and no pain.  He is tolerating exercises well within rehab protocol.  Continued encouragement given to communicate to MD regarding BP and medication management. Progressing towards LTGs.    Rehab Potential  Excellent    PT Frequency  2x / week    PT Duration  12 weeks    PT Treatment/Interventions  ADLs/Self Care Home Management;Cryotherapy;Electrical Stimulation;Iontophoresis 4mg /ml Dexamethasone;Moist  Heat;Ultrasound;Therapeutic activities;Therapeutic exercise;Neuromuscular re-education;Patient/family education;Manual techniques;Passive range of motion;Dry needling;Taping    PT Next Visit Plan  Progress with appropriate exercise per protocol as patient tolerates; work on posterior shoulder girdle strengthening and postural awareness; manual work and PROM Rt shoulder. Modalities as indicated.    Consulted and Agree with Plan of Care  Patient       Patient will benefit from skilled therapeutic intervention in order to improve the following deficits and impairments:  Decreased range of motion, Increased fascial restricitons, Impaired UE functional use, Decreased activity tolerance, Pain, Improper body mechanics, Decreased strength, Postural dysfunction  Visit Diagnosis: Acute pain of right shoulder  Muscle weakness (generalized)  Stiffness of right shoulder, not elsewhere classified  Abnormal posture     Problem List Patient Active Problem List   Diagnosis Date Noted  . Rotator cuff tear arthropathy, right 02/13/2019  . 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 Skin problem 02/17/2016  . Macrocytosis without anemia 02/17/2016  . History of skin cancer 10/26/2015  . Essential hypertension 01/05/2015   Kerin Perna, PTA 05/23/19 6:04 PM  [place], [place], [address] Phone: [phone]   Fax:  [phone]  Name: Billy Landry MRN: [id number] Date of Birth: [birth year]

## 2019-05-27 ENCOUNTER — Other Ambulatory Visit: Payer: Self-pay

## 2019-05-27 ENCOUNTER — Encounter: Payer: Self-pay | Admitting: Rehabilitative and Restorative Service Providers"

## 2019-05-27 ENCOUNTER — Ambulatory Visit (INDEPENDENT_AMBULATORY_CARE_PROVIDER_SITE_OTHER): Payer: Medicare Other | Admitting: Rehabilitative and Restorative Service Providers"

## 2019-05-27 DIAGNOSIS — M25511 Pain in right shoulder: Secondary | ICD-10-CM

## 2019-05-27 DIAGNOSIS — R600 Localized edema: Secondary | ICD-10-CM

## 2019-05-27 DIAGNOSIS — M25611 Stiffness of right shoulder, not elsewhere classified: Secondary | ICD-10-CM | POA: Diagnosis not present

## 2019-05-27 DIAGNOSIS — R293 Abnormal posture: Secondary | ICD-10-CM | POA: Diagnosis not present

## 2019-05-27 DIAGNOSIS — M6281 Muscle weakness (generalized): Secondary | ICD-10-CM

## 2019-05-27 NOTE — Therapy (Signed)
[place], [place], [address] Phone: [phone]   Fax:  [phone]  Physical Therapy Treatment  Patient Details  Name: Billy Landry MRN: [id number] Date of Birth: Jun 24, 1950 Referring Provider (PT): Dr Ophelia Charter    Encounter Date: 05/27/2019  PT End of Session - 05/27/19 0942    Visit Number  5    Number of Visits  24    Date for PT Re-Evaluation  08/05/19    Authorization - Number of Visits  24    PT Start Time  0933    PT Stop Time  1015    PT Time Calculation (min)  42 min    Activity Tolerance  Patient tolerated treatment well       Past Medical History:  Diagnosis Date  . Allergy   . Arthritis    hands, hips  . BPH associated with nocturia   . DJD of right shoulder   . Essential hypertension 01/05/2015  . Hypertension   . Shingles 12/2017    Past Surgical History:  Procedure Laterality Date  . CATARACT EXTRACTION Left 2013  . RETINAL DETACHMENT SURGERY  2012  . REVERSE SHOULDER ARTHROPLASTY Right 04/24/2019   Procedure: RIGHT REVERSE SHOULDER ARTHROPLASTY;  Surgeon: Hiram Gash, MD;  Location: [facility];  Service: Orthopedics;  Laterality: Right;  . ROTATOR CUFF REPAIR Right 2010  . TONSILECTOMY, ADENOIDECTOMY, BILATERAL MYRINGOTOMY AND TUBES  1968  . VASECTOMY      There were no vitals filed for this visit.  Subjective Assessment - 05/27/19 0943    Subjective  Patient reports that he is doing well with the shoulder. He has weaned from abduction sling except at night. Tried without it at night but had difficulty sleeping. Had some low BP this am but will contact MD to discuss medication for HTN.    Currently in Pain?  No/denies         [facility] PT Assessment - 05/27/19 0001      Assessment   Medical Diagnosis  Rt reverse total shoulder     Referring Provider (PT)  Dr Ophelia Charter     Onset Date/Surgical Date  04/24/19    Hand Dominance  Right    Next  MD Visit  05/23/19    Prior Therapy  after RCR       PROM   Right Shoulder Flexion  120 Degrees    Right Shoulder ABduction  105 Degrees   in scapular plane    Right Shoulder External Rotation  34 Degrees      Palpation   Palpation comment  muscular tightness Lt upper quarter through pecs; upper trap; leveator; periscapular musculature                    [facility] Adult PT Treatment/Exercise - 05/27/19 0001      Shoulder Exercises: Standing   Other Standing Exercises  scap squeeze with noodle along spine 10 sec x 10 reps    focus on posterior shoulder girdle without recruiting UT      Shoulder Exercises: Pulleys   Flexion  --   5-10 sec hold x 5 - verbal and tactile cues for position      Shoulder Exercises: Therapy Ball   Other Therapy Ball Exercises  pressing into exercise ball for stabilization 10 sec x 10     Other Therapy Ball Exercises  UE exercises with 10 inch ball working elbows; wrists; hands - seated  with noodle bnt shoulder blades focus on scapular stability - 10 reps each exercise       Shoulder Exercises: ROM/Strengthening   Pendulum  20 CW/20CCW      Shoulder Exercises: Isometric Strengthening   ABduction  --   10 reps x 5 sec holds, cues for form     Shoulder Exercises: Stretch   External Rotation Stretch  10 seconds   10 reps standing noodle along spine elbow 90 deg    External Rotation Stretch Limitations  shoulder in scapular plane       Modalities   Modalities  Vasopneumatic      Vasopneumatic   Number Minutes Vasopneumatic   10 minutes    Vasopnuematic Location   Shoulder   Rt   Vasopneumatic Pressure  Low    Vasopneumatic Temperature   34 deg      Manual Therapy   Manual therapy comments  pt supine     Soft tissue mobilization  deep tissue work Rt upper trap, teres: and pec to decrease fascial restrictions     Passive ROM  Rt shoulder flexion, scaption, and ER (in neutral position) - to tissue limits and no pain.               PT Education - 05/27/19 1127    Education Details  reviewed HEP and protocol - continue with current program    Person(s) Educated  Patient    Methods  Explanation       PT Short Term Goals - 05/13/19 1243      PT SHORT TERM GOAL #1   Title  Improve posture and alignment with patient to demonstrate upright posture with posterior shoulder girdle engaged    Time  6    Period  Weeks    Status  New    Target Date  06/24/19      PT SHORT TERM GOAL #2   Title  Increase PROM to 120 deg flexion and scaption; 30+ degrees ER in neutral - progressing per protocol    Time  6    Period  Weeks    Status  New    Target Date  06/24/19      PT SHORT TERM GOAL #3   Title  independent in initial HEP    Time  6    Period  Weeks    Status  New    Target Date  06/24/19      PT SHORT TERM GOAL #4   Title  pain free at rest with no more that 2-3/10 pain following exercise    Time  6    Period  Weeks    Status  New    Target Date  06/24/19        PT Long Term Goals - 05/13/19 1247      PT LONG TERM GOAL #1   Title  Independent in advanced HEP    Time  12    Period  Weeks    Status  New    Target Date  07/17/19      PT LONG TERM GOAL #2   Title  Functional use and independent in all ADL's    Time  12    Period  Weeks    Status  New    Target Date  07/17/19      PT LONG TERM GOAL #3   Title  AROM Rt shoulder WFL's and per protocol    Time  12  Period  Weeks    Status  New    Target Date  07/17/19      PT LONG TERM GOAL #4   Title  4/5 to 4+/5 strength Rt shoulder    Time  12    Period  Weeks    Status  New    Target Date  07/17/19      PT LONG TERM GOAL #5   Title  Plan to return to work - lifting and carrying objects weighing 25-35 pounds    Time  12    Period  Weeks    Status  New    Target Date  07/17/19            Plan - 05/27/19 1134    Clinical Impression Statement  Progressing well with rehab per protocol. Patient will continue with  HEP and return next week when we can progress with shoulder rehab protocol.    Clinical Decision Making  Low    Rehab Potential  Excellent    PT Frequency  2x / week    PT Duration  12 weeks    PT Treatment/Interventions  ADLs/Self Care Home Management;Cryotherapy;Electrical Stimulation;Iontophoresis 4mg /ml Dexamethasone;Moist Heat;Ultrasound;Therapeutic activities;Therapeutic exercise;Neuromuscular re-education;Patient/family education;Manual techniques;Passive range of motion;Dry needling;Taping    PT Next Visit Plan  Progress with appropriate exercise per protocol as patient tolerates; work on posterior shoulder girdle strengthening and postural awareness; manual work and PROM Rt shoulder. Modalities as indicated.    PT Home Exercise Plan  HEP    Consulted and Agree with Plan of Care  Patient       Patient will benefit from skilled therapeutic intervention in order to improve the following deficits and impairments:     Visit Diagnosis: Acute pain of right shoulder  Muscle weakness (generalized)  Stiffness of right shoulder, not elsewhere classified  Abnormal posture  Localized edema     Problem List Patient Active Problem List   Diagnosis Date Noted  . Rotator cuff tear arthropathy, right 02/13/2019  . 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 Skin problem 02/17/2016  . Macrocytosis without anemia 02/17/2016  . History of skin cancer 10/26/2015  . Essential hypertension 01/05/2015     Nilda Simmer PT, MPH  05/27/2019, 11:37 AM  [facility] [place], [place], [address] Phone: [phone]   Fax:  [phone]  Name: Chaos Poke MRN: [id number] Date of Birth: 1950/10/26

## 2019-05-30 ENCOUNTER — Encounter: Payer: Medicare Other | Admitting: Physical Therapy

## 2019-06-03 ENCOUNTER — Other Ambulatory Visit: Payer: Self-pay

## 2019-06-03 ENCOUNTER — Ambulatory Visit (INDEPENDENT_AMBULATORY_CARE_PROVIDER_SITE_OTHER): Payer: Medicare Other | Admitting: Physical Therapy

## 2019-06-03 DIAGNOSIS — R600 Localized edema: Secondary | ICD-10-CM | POA: Diagnosis not present

## 2019-06-03 DIAGNOSIS — M25611 Stiffness of right shoulder, not elsewhere classified: Secondary | ICD-10-CM

## 2019-06-03 DIAGNOSIS — M25511 Pain in right shoulder: Secondary | ICD-10-CM

## 2019-06-03 DIAGNOSIS — R293 Abnormal posture: Secondary | ICD-10-CM | POA: Diagnosis not present

## 2019-06-03 DIAGNOSIS — M6281 Muscle weakness (generalized): Secondary | ICD-10-CM

## 2019-06-03 NOTE — Therapy (Signed)
[place], [place], [address] Phone: [phone]   Fax:  [phone]  Physical Therapy Treatment  Patient Details  Name: Billy Landry MRN: [id number] Date of Birth: 07-06-1950 Referring Provider (PT): Dr Ophelia Charter    Encounter Date: 06/03/2019  PT End of Session - 06/03/19 1055    Visit Number  6    Number of Visits  24    Date for PT Re-Evaluation  08/05/19    Authorization - Number of Visits  24    PT Start Time  [id number]    PT Stop Time  1059    PT Time Calculation (min)  41 min    Activity Tolerance  Patient tolerated treatment well    Behavior During Therapy  [facility] for tasks assessed/performed       Past Medical History:  Diagnosis Date  . Allergy   . Arthritis    hands, hips  . BPH associated with nocturia   . DJD of right shoulder   . Essential hypertension 01/05/2015  . Hypertension   . Shingles 12/2017    Past Surgical History:  Procedure Laterality Date  . CATARACT EXTRACTION Left 2013  . RETINAL DETACHMENT SURGERY  2012  . REVERSE SHOULDER ARTHROPLASTY Right 04/24/2019   Procedure: RIGHT REVERSE SHOULDER ARTHROPLASTY;  Surgeon: Hiram Gash, MD;  Location: [place];  Service: Orthopedics;  Laterality: Right;  . ROTATOR CUFF REPAIR Right 2010  . TONSILECTOMY, ADENOIDECTOMY, BILATERAL MYRINGOTOMY AND TUBES  1968  . VASECTOMY      There were no vitals filed for this visit.  Subjective Assessment - 06/03/19 1052    Subjective  Pt reports he lifted 2 cases of water in/out of his shopping cart; without any issue or pain.  Pt reports he is still monitoring his BP and doing a better job to drink more water.  Pt states he is pleased with his progress so far.    Currently in Pain?  No/denies    Pain Score  0-No pain         [facility] PT Assessment - 06/03/19 0001      Assessment   Medical Diagnosis  Rt reverse total shoulder     Referring Provider (PT)  Dr Ophelia Charter     Onset Date/Surgical Date  04/24/19    Hand Dominance  Right    Next MD Visit  07/18/19    Prior Therapy  after RCR       PROM   Right Shoulder Flexion  132 Degrees    Right Shoulder ABduction  120 Degrees    Right Shoulder External Rotation  44 Degrees      [facility] Adult PT Treatment/Exercise - 06/03/19 0001      Shoulder Exercises: Seated   Other Seated Exercises  scapular depression pressing into large therapy ball at side 5 sec hold x 10     Other Seated Exercises  scap squeeze x 5 sec x 10; shoulder rolls x 10 reps clockwise      Shoulder Exercises: Standing   Other Standing Exercises  wall ladder in flexion x 5 reps     Other Standing Exercises  ball on wall with arm flexed20 deg and elbow flexed- 20 CW/CCW with focus on scap stability and retraction       Shoulder Exercises: Therapy Ball   Other Therapy Ball Exercises  Rt shoulder scap depression, pressing into exercise ball for stabilization 10 sec x 10  Vasopneumatic   Number Minutes Vasopneumatic   10 minutes    Vasopnuematic Location   Shoulder   Rt   Vasopneumatic Pressure  Low    Vasopneumatic Temperature   34 deg      Manual Therapy   Manual Therapy  Myofascial release;Soft tissue mobilization    Manual therapy comments  pt supine     Soft tissue mobilization  STM to Rt periscapular muscles to release fascial tightness.     Myofascial Release  Rt pec release     Passive ROM  Rt shoulder flexion, scaption, and ER (in neutral position) - to tissue limits and no pain.                PT Short Term Goals - 05/13/19 1243      PT SHORT TERM GOAL #1   Title  Improve posture and alignment with patient to demonstrate upright posture with posterior shoulder girdle engaged    Time  6    Period  Weeks    Status  New    Target Date  06/24/19      PT SHORT TERM GOAL #2   Title  Increase PROM to 120 deg flexion and scaption; 30+ degrees ER in neutral - progressing per protocol    Time  6    Period   Weeks    Status  New    Target Date  06/24/19      PT SHORT TERM GOAL #3   Title  independent in initial HEP    Time  6    Period  Weeks    Status  New    Target Date  06/24/19      PT SHORT TERM GOAL #4   Title  pain free at rest with no more that 2-3/10 pain following exercise    Time  6    Period  Weeks    Status  New    Target Date  06/24/19        PT Long Term Goals - 05/13/19 1247      PT LONG TERM GOAL #1   Title  Independent in advanced HEP    Time  12    Period  Weeks    Status  New    Target Date  07/17/19      PT LONG TERM GOAL #2   Title  Functional use and independent in all ADL's    Time  12    Period  Weeks    Status  New    Target Date  07/17/19      PT LONG TERM GOAL #3   Title  AROM Rt shoulder WFL's and per protocol    Time  12    Period  Weeks    Status  New    Target Date  07/17/19      PT LONG TERM GOAL #4   Title  4/5 to 4+/5 strength Rt shoulder    Time  12    Period  Weeks    Status  New    Target Date  07/17/19      PT LONG TERM GOAL #5   Title  Plan to return to work - lifting and carrying objects weighing 25-35 pounds    Time  12    Period  Weeks    Status  New    Target Date  07/17/19            Plan - 06/03/19 1053  Clinical Impression Statement  Pt's PROM in Rt shoulder progressing well.  He tolerated all exercises today well, without pain. Progressing well within rehab protocol.  Encouraged pt to follow guidelines of protocol and not lift anything heavier than coffee cup; pt verbalized understanding.    Rehab Potential  Excellent    PT Frequency  2x / week    PT Duration  12 weeks    PT Treatment/Interventions  ADLs/Self Care Home Management;Cryotherapy;Electrical Stimulation;Iontophoresis 4mg /ml Dexamethasone;Moist Heat;Ultrasound;Therapeutic activities;Therapeutic exercise;Neuromuscular re-education;Patient/family education;Manual techniques;Passive range of motion;Dry needling;Taping    PT Next Visit Plan   progress Rt shoulder ROM and strengthening within rehab protocol.    PT Home Exercise Plan  HEP    Consulted and Agree with Plan of Care  Patient       Patient will benefit from skilled therapeutic intervention in order to improve the following deficits and impairments:     Visit Diagnosis: Stiffness of right shoulder, not elsewhere classified  Abnormal posture  Localized edema  Muscle weakness (generalized)  Acute pain of right shoulder     Problem List Patient Active Problem List   Diagnosis Date Noted  . Rotator cuff tear arthropathy, right 02/13/2019  . 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 Skin problem 02/17/2016  . Macrocytosis without anemia 02/17/2016  . History of skin cancer 10/26/2015  . Essential hypertension 01/05/2015    Shelbie Hutching 06/03/2019, 5:08 PM  [facility] [place], [place], [address] Phone: [phone]   Fax:  [phone]  Name: Billy Landry MRN: [id number] Date of Birth: Jul 26, 1950

## 2019-06-06 ENCOUNTER — Ambulatory Visit (INDEPENDENT_AMBULATORY_CARE_PROVIDER_SITE_OTHER): Payer: Medicare Other | Admitting: Rehabilitative and Restorative Service Providers"

## 2019-06-06 ENCOUNTER — Encounter: Payer: Self-pay | Admitting: Rehabilitative and Restorative Service Providers"

## 2019-06-06 ENCOUNTER — Other Ambulatory Visit: Payer: Self-pay

## 2019-06-06 DIAGNOSIS — R293 Abnormal posture: Secondary | ICD-10-CM | POA: Diagnosis not present

## 2019-06-06 DIAGNOSIS — M25511 Pain in right shoulder: Secondary | ICD-10-CM | POA: Diagnosis not present

## 2019-06-06 DIAGNOSIS — M25611 Stiffness of right shoulder, not elsewhere classified: Secondary | ICD-10-CM | POA: Diagnosis not present

## 2019-06-06 DIAGNOSIS — R600 Localized edema: Secondary | ICD-10-CM

## 2019-06-06 DIAGNOSIS — M6281 Muscle weakness (generalized): Secondary | ICD-10-CM | POA: Diagnosis not present

## 2019-06-06 NOTE — Patient Instructions (Addendum)
Access Code: Access Code: Access Code: [email or web address]  URL: [email or web address]  Date: 06/06/2019  Prepared by: Gillermo Murdoch   Exercises  Standing Isometric Shoulder External Rotation with Doorway - 5 -10 reps - 1 sets - 5 sec hold - 2x daily - 7x weekly  Standing Isometric Shoulder Internal Rotation with Towel Roll at Doorway - 5 -10 reps - 1 sets - 5 sec hold - 2x daily - 7x weekly  Standing Shoulder Internal Rotation Stretch with Hands Behind Back - 3 reps - 1 sets - 10 sec hold - 2x daily - 7x weekly  Prone Shoulder Extension - Single Arm - 10 reps - 1 sets - 2-3 sec hold - 2x daily - 7x weekly  Prone Shoulder Row - 10 reps - 1 sets - 2-3 sec hold - 2x daily - 7x weekly  Supine Alternating Shoulder Flexion - 10 reps - 1 sets - 1-2 sec hold - 2x daily - 7x weekly

## 2019-06-06 NOTE — Therapy (Signed)
[place], [place], [address] Phone: [phone]   Fax:  [phone]  Physical Therapy Treatment  Patient Details  Name: Billy Landry MRN: [id number] Date of Birth: 06/12/1950 Referring Provider (PT): Dr Ophelia Charter    Encounter Date: 06/06/2019  PT End of Session - 06/06/19 1447    Visit Number  7    Number of Visits  24    Date for PT Re-Evaluation  08/05/19    Authorization - Number of Visits  24    PT Start Time  [id number]    PT Stop Time  1530    PT Time Calculation (min)  45 min    Activity Tolerance  Patient tolerated treatment well       Past Medical History:  Diagnosis Date  . Allergy   . Arthritis    hands, hips  . BPH associated with nocturia   . DJD of right shoulder   . Essential hypertension 01/05/2015  . Hypertension   . Shingles 12/2017    Past Surgical History:  Procedure Laterality Date  . CATARACT EXTRACTION Left 2013  . RETINAL DETACHMENT SURGERY  2012  . REVERSE SHOULDER ARTHROPLASTY Right 04/24/2019   Procedure: RIGHT REVERSE SHOULDER ARTHROPLASTY;  Surgeon: Hiram Gash, MD;  Location: [place];  Service: Orthopedics;  Laterality: Right;  . ROTATOR CUFF REPAIR Right 2010  . TONSILECTOMY, ADENOIDECTOMY, BILATERAL MYRINGOTOMY AND TUBES  1968  . VASECTOMY      There were no vitals filed for this visit.  Subjective Assessment - 06/06/19 1448    Subjective  Patient reports that his shoulder is still feeling good - no pain    Currently in Pain?  No/denies                       [facility] Adult PT Treatment/Exercise - 06/06/19 0001      Shoulder Exercises: Supine   Other Supine Exercises  small range flex/ext; horizontal ab/ad; circles CW/CCW ~ 90 deg shoulder flexion 10 reps each direction focus on scapular control/chest lift throughout       Shoulder Exercises: Prone   Extension  AROM;Right;10 reps   to lateral hip - focus on  scapular position    Other Prone Exercises  modified row - focus on srapular position - down and back x 10       Shoulder Exercises: Standing   Other Standing Exercises  pressing into 10 inch ball on wall elbow ~ 90 deg flex/she ~ 20 deg flexion       Shoulder Exercises: Pulleys   Flexion  --   10 sec hold x 10 reps    Scaption  --   10 sec hold x 10 reps     Shoulder Exercises: Therapy Ball   Other Therapy Ball Exercises  Rt shoulder scap depression, pressing into exercise ball for stabilization 10 sec x 10     Other Therapy Ball Exercises  UE exercises with 10 inch ball working elbows; wrists; hands - seated with noodle bnt shoulder blades focus on scapular stability - 10 reps each exercise       Shoulder Exercises: Isometric Strengthening   External Rotation  5X5"    Internal Rotation  5X5"    ABduction  5X5"      Shoulder Exercises: Stretch   Internal Rotation Stretch  3 reps   10 sec    Internal Rotation Stretch Limitations  gentle stretch Rt  hand at hip assisted stretch with strap       Manual Therapy   Manual therapy comments  pt supine     Soft tissue mobilization  working through the anterior chest/pecs and deltoid; upper trap/leveator; teres Rt shoulder girdle     Passive ROM  Rt shoulder flexion, scaption, and ER (in neutral position) very gentle shoulder extension - to tissue limits and no pain.              PT Education - 06/06/19 1518    Education Details  HEP    Person(s) Educated  Patient    Methods  Explanation;Demonstration;Tactile cues;Verbal cues;Handout    Comprehension  Verbalized understanding;Returned demonstration;Verbal cues required;Tactile cues required       PT Short Term Goals - 05/13/19 1243      PT SHORT TERM GOAL #1   Title  Improve posture and alignment with patient to demonstrate upright posture with posterior shoulder girdle engaged    Time  6    Period  Weeks    Status  New    Target Date  06/24/19      PT SHORT TERM GOAL #2    Title  Increase PROM to 120 deg flexion and scaption; 30+ degrees ER in neutral - progressing per protocol    Time  6    Period  Weeks    Status  New    Target Date  06/24/19      PT SHORT TERM GOAL #3   Title  independent in initial HEP    Time  6    Period  Weeks    Status  New    Target Date  06/24/19      PT SHORT TERM GOAL #4   Title  pain free at rest with no more that 2-3/10 pain following exercise    Time  6    Period  Weeks    Status  New    Target Date  06/24/19        PT Long Term Goals - 05/13/19 1247      PT LONG TERM GOAL #1   Title  Independent in advanced HEP    Time  12    Period  Weeks    Status  New    Target Date  07/17/19      PT LONG TERM GOAL #2   Title  Functional use and independent in all ADL's    Time  12    Period  Weeks    Status  New    Target Date  07/17/19      PT LONG TERM GOAL #3   Title  AROM Rt shoulder WFL's and per protocol    Time  12    Period  Weeks    Status  New    Target Date  07/17/19      PT LONG TERM GOAL #4   Title  4/5 to 4+/5 strength Rt shoulder    Time  12    Period  Weeks    Status  New    Target Date  07/17/19      PT LONG TERM GOAL #5   Title  Plan to return to work - lifting and carrying objects weighing 25-35 pounds    Time  12    Period  Weeks    Status  New    Target Date  07/17/19            Plan -  06/06/19 1519    Clinical Impression Statement  Continued education on shoulder precautions and progression of shoulder rehab. Added exercises per protocol. Continued with manual work and PROM. Gradually progressing with shoulder rehab per protocol.    Rehab Potential  Excellent    PT Frequency  2x / week    PT Duration  12 weeks    PT Treatment/Interventions  ADLs/Self Care Home Management;Cryotherapy;Electrical Stimulation;Iontophoresis 4mg /ml Dexamethasone;Moist Heat;Ultrasound;Therapeutic activities;Therapeutic exercise;Neuromuscular re-education;Patient/family education;Manual  techniques;Passive range of motion;Dry needling;Taping    PT Next Visit Plan  progress Rt shoulder ROM and strengthening per rehab protocol.    PT Home Exercise Plan  [email or web address]    Consulted and Agree with Plan of Care  Patient       Patient will benefit from skilled therapeutic intervention in order to improve the following deficits and impairments:     Visit Diagnosis: Stiffness of right shoulder, not elsewhere classified  Abnormal posture  Localized edema  Muscle weakness (generalized)  Acute pain of right shoulder     Problem List Patient Active Problem List   Diagnosis Date Noted  . Rotator cuff tear arthropathy, right 02/13/2019  . 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 Skin problem 02/17/2016  . Macrocytosis without anemia 02/17/2016  . History of skin cancer 10/26/2015  . Essential hypertension 01/05/2015    Dmarcus Decicco Nilda Simmer PT, MPH  06/06/2019, 9:18 PM  [facility] [place], [place], [address] Phone: [phone]   Fax:  [phone]  Name: Billy Landry MRN: [id number] Date of Birth: 31-Jul-1950

## 2019-06-10 ENCOUNTER — Ambulatory Visit (INDEPENDENT_AMBULATORY_CARE_PROVIDER_SITE_OTHER): Payer: Medicare Other | Admitting: Physical Therapy

## 2019-06-10 ENCOUNTER — Encounter: Payer: Self-pay | Admitting: Physical Therapy

## 2019-06-10 ENCOUNTER — Other Ambulatory Visit: Payer: Self-pay

## 2019-06-10 DIAGNOSIS — M25611 Stiffness of right shoulder, not elsewhere classified: Secondary | ICD-10-CM

## 2019-06-10 DIAGNOSIS — M6281 Muscle weakness (generalized): Secondary | ICD-10-CM | POA: Diagnosis not present

## 2019-06-10 DIAGNOSIS — R293 Abnormal posture: Secondary | ICD-10-CM

## 2019-06-10 NOTE — Therapy (Signed)
[place], [place], [address] Phone: [phone]   Fax:  [phone]  Physical Therapy Treatment  Patient Details  Name: Billy Landry MRN: [id number] Date of Birth: 1951/02/20 Referring Provider (PT): Dr Ophelia Charter    Encounter Date: 06/10/2019  PT End of Session - 06/10/19 1013    Visit Number  8    Number of Visits  24    Date for PT Re-Evaluation  08/05/19    Authorization - Visit Number  8    Authorization - Number of Visits  24    PT Start Time  0933    PT Stop Time  1012    PT Time Calculation (min)  39 min    Activity Tolerance  Patient tolerated treatment well;No increased pain    Behavior During Therapy  WFL for tasks assessed/performed       Past Medical History:  Diagnosis Date  . Allergy   . Arthritis    hands, hips  . BPH associated with nocturia   . DJD of right shoulder   . Essential hypertension 01/05/2015  . Hypertension   . Shingles 12/2017    Past Surgical History:  Procedure Laterality Date  . CATARACT EXTRACTION Left 2013  . RETINAL DETACHMENT SURGERY  2012  . REVERSE SHOULDER ARTHROPLASTY Right 04/24/2019   Procedure: RIGHT REVERSE SHOULDER ARTHROPLASTY;  Surgeon: Hiram Gash, MD;  Location: [place];  Service: Orthopedics;  Laterality: Right;  . ROTATOR CUFF REPAIR Right 2010  . TONSILECTOMY, ADENOIDECTOMY, BILATERAL MYRINGOTOMY AND TUBES  1968  . VASECTOMY      There were no vitals filed for this visit.  Subjective Assessment - 06/10/19 0937    Subjective  Pt reports he slept last night without a pillow supporting his arm, "I did ok".  He states he continues to monitor blood pressure; no more low readings.    Pertinent History  Rt RCR ~ 10 yrs ago, detached retina years ago; arthritis hands; HTN    Patient Stated Goals  full use of arm - golfing; tennis; household chores/activities    Currently in Pain?  No/denies    Pain Score  0-No  pain         [facility] PT Assessment - 06/10/19 0001      Assessment   Medical Diagnosis  Rt reverse total shoulder     Referring Provider (PT)  Dr Ophelia Charter     Onset Date/Surgical Date  04/24/19    Hand Dominance  Right    Next MD Visit  07/18/19    Prior Therapy  after RCR       AROM   AROM Assessment Site  Shoulder    Right/Left Shoulder  Right    Right Shoulder Extension  22 Degrees    Right Shoulder Flexion  120 Degrees    Right Shoulder ABduction  95 Degrees      PROM   Right Shoulder Flexion  137 Degrees    Right Shoulder ABduction  125 Degrees       [facility] Adult PT Treatment/Exercise - 06/10/19 0001      Shoulder Exercises: Supine   Other Supine Exercises  small range flex/ext, horizontal ab/ad; circles CW/CCW ~ 90 deg shoulder flexion 10 reps each direction focus on scapular control throughout      Shoulder Exercises: Prone   Extension  AROM;Right;10 reps   to lateral hip - focus on scapular position    Other Prone  Exercises  prone Rt row to neutral - focus on scapular position - down and back x 10       Shoulder Exercises: Standing   Flexion  AROM;Right;10 reps    ABduction  AROM;Right;10 reps    Extension  AROM;Right;5 reps   within tissue limits and no pain   Other Standing Exercises  wall ladder in flexion x 5 reps     Other Standing Exercises  L's and W's x 3 sec hold x 10 reps each (in front of mirror for visual feedback)      Shoulder Exercises: Pulleys   Flexion  --   10 sec hold x 10 reps    Scaption  --   10 sec hold x 10 reps     Shoulder Exercises: ROM/Strengthening   Rhythmic Stabilization, Supine  RUE - 45 and 90 deg, gentle perterbations x 20 sec x 2 reps each position.       Shoulder Exercises: Isometric Strengthening   External Rotation  5X5"    Internal Rotation  5X5"    ABduction  5X5"      Shoulder Exercises: Stretch   Internal Rotation Stretch  1 rep   10 sec   Internal Rotation Stretch Limitations  gentle stretch with LUE  assisting Rt hand at hip.       Manual Therapy   Manual therapy comments  pt supine     Soft tissue mobilization  STM to anterior chest/pecs and deltoid; upper trap/levator     Myofascial Release  Rt pec release     Passive ROM  Rt shoulder flexion, scaption, and ER (in neutral position) very gentle shoulder extension - to tissue limits and no pain.                PT Short Term Goals - 06/10/19 1125      PT SHORT TERM GOAL #1   Title  Improve posture and alignment with patient to demonstrate upright posture with posterior shoulder girdle engaged    Time  6    Period  Weeks    Status  Achieved    Target Date  06/24/19      PT SHORT TERM GOAL #2   Title  Increase PROM to 120 deg flexion and scaption; 30+ degrees ER in neutral - progressing per protocol    Time  6    Period  Weeks    Status  Achieved    Target Date  06/24/19      PT SHORT TERM GOAL #3   Title  independent in initial HEP    Time  6    Period  Weeks    Status  Achieved    Target Date  06/24/19      PT SHORT TERM GOAL #4   Title  pain free at rest with no more that 2-3/10 pain following exercise    Time  6    Period  Weeks    Status  Achieved    Target Date  06/24/19        PT Long Term Goals - 05/13/19 1247      PT LONG TERM GOAL #1   Title  Independent in advanced HEP    Time  12    Period  Weeks    Status  New    Target Date  07/17/19      PT LONG TERM GOAL #2   Title  Functional use and independent in all ADL's  Time  12    Period  Weeks    Status  New    Target Date  07/17/19      PT LONG TERM GOAL #3   Title  AROM Rt shoulder WFL's and per protocol    Time  12    Period  Weeks    Status  New    Target Date  07/17/19      PT LONG TERM GOAL #4   Title  4/5 to 4+/5 strength Rt shoulder    Time  12    Period  Weeks    Status  New    Target Date  07/17/19      PT LONG TERM GOAL #5   Title  Plan to return to work - lifting and carrying objects weighing 25-35 pounds     Time  12    Period  Weeks    Status  New    Target Date  07/17/19            Plan - 06/10/19 1019    Clinical Impression Statement  Continued review/reminder to patient shoulder precautions and progression of shoulder rehab; pt is 6 wks post op.  Pt's Rt shoulder ROM progressing well within protocol guidelines.  He was able to tolerate all exercises well, requiring only minor cues of scapular positioning.  Pt has met all STG.    Rehab Potential  Excellent    PT Frequency  2x / week    PT Duration  12 weeks    PT Treatment/Interventions  ADLs/Self Care Home Management;Cryotherapy;Electrical Stimulation;Iontophoresis 51m/ml Dexamethasone;Moist Heat;Ultrasound;Therapeutic activities;Therapeutic exercise;Neuromuscular re-education;Patient/family education;Manual techniques;Passive range of motion;Dry needling;Taping    PT Next Visit Plan  progress Rt shoulder ROM and strengthening per rehab protocol.    PT Home Exercise Plan  [email or web address]    Consulted and Agree with Plan of Care  Patient       Patient will benefit from skilled therapeutic intervention in order to improve the following deficits and impairments:  Decreased range of motion, Increased fascial restricitons, Impaired UE functional use, Decreased activity tolerance, Pain, Improper body mechanics, Decreased strength, Postural dysfunction  Visit Diagnosis: Stiffness of right shoulder, not elsewhere classified  Abnormal posture  Muscle weakness (generalized)     Problem List Patient Active Problem List   Diagnosis Date Noted  . Rotator cuff tear arthropathy, right 02/13/2019  . 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 Skin problem 02/17/2016  . Macrocytosis without anemia 02/17/2016  . History of skin cancer 10/26/2015  . Essential hypertension 01/05/2015   JKerin Perna PTA 06/10/19 11:31 AM  [facility] [address]Phone: [phone]  Fax:  [phone] Name: Billy HerrenMRN: [id number] of Birth: 429-Nov-1952

## 2019-06-12 ENCOUNTER — Telehealth: Payer: Self-pay

## 2019-06-12 NOTE — Telephone Encounter (Signed)
Received Epic notification that pt has not read MyChart message regarding results.     RE: Non-Urgent Medical Question  From  Emeterio Reeve, DO To  Boris Sharper "[name]" Sent and Delivered  05/28/2019 12:52 PM  I think can take for a few days and see, might help to take in the evenings though some people report this makes them get up more at night to pee. If significant dizziness, please stop the medicine!   Keep me posted!   Previous Messages  ----- Message -----    From:Zaniel Heupel    Sent:05/27/2019 12:08 PM EST     FO:[id number] Sheppard Coil, DO  Subject:RE: Non-Urgent Medical Question   Good Monday morning... Update on BP tracking:  Yesterday, started the half dosage (12.5mg ) hydroclorothiazide.  9:06am Before meds 147/91 pulse 65  9:12am 131/85 p60  Today 8:06am Before meds 113/67 p 68 (50mg  Losartin 12.5 Hydro)  After breakfast, felt a little weak 8:53am 97/55 p86 8:57am 77/50 p 78 9:03am 85/53 p94   9:30am at therapy & felt fine 11:49am 124/74 p 64   I know this is just one day after reducing the Hydro, but with the fluctuation of the readings, thought it would be good to pass along to you. Obviously, I am curious as to what is going on, but wondering if I should keep on with the 12.5mg  Hydro or if I should discontinue? Keep taking it for a few days & see? Let me know your thoughts. Thanks.  PS: Therapy is going well - ahead of where Dr. Griffin Basil wants me to be.    ----- Message -----    From:Natalie Alexander, DO    Sent:05/24/2019 1:32 PM EST     HL:[id number] [place]  Subject:RE: Non-Urgent Medical Question   Looks good overall, some of these numbers are a bit low! We might try going down on the hydrochlorothiazide from 25 mg to 12.5 mg and see how this does?    ----- Message -----    From:Lamarkus Dobis    Sent:05/20/2019 2:04 PM EST     [id number] Sheppard Coil, DO  Subject:Non-Urgent Medical Question   BP Update... doing  well before and after surgery... Follow below:  Tues. 115/72 pulse 68. Wed. 108/63 p 71. Thur. 125/79 p73. Fri. 98/60 p 72. Saturday "anomaly" with BP. 905a - 115/73 p 67. After Cereal & coffee, lightheaded. 1016a 65/43 p84 1022a 66/43 p81 1033a 86/55 p81 had water & ES Tylenol. 1043a 76/51 p74. Laid down, feet up 1146a 130/57 p57. After that, have felt fine. Sun. 142/86 p 55 didn't take Losartan (50mg ) or Hydroclorothiazide (25mg ). Today 143/84 p66. Feeling fine yesterday & today. Had therapy & all good. I know I have been lax in my intake of water & trying to do better with that.   I am feeling but thought you should be aware. Therapy continues twice a week at your location. Have 4 week surgery follow up on Thursday with Dr. Griffin Basil ([facility]).   Thanks.  [place] User Last Read On  Derrion Warren "[name]" Not Read       LVM for pt to call to discuss.  Charyl Bigger, CMA

## 2019-06-13 ENCOUNTER — Ambulatory Visit (INDEPENDENT_AMBULATORY_CARE_PROVIDER_SITE_OTHER): Payer: Medicare Other | Admitting: Rehabilitative and Restorative Service Providers"

## 2019-06-13 ENCOUNTER — Encounter: Payer: Self-pay | Admitting: Rehabilitative and Restorative Service Providers"

## 2019-06-13 ENCOUNTER — Other Ambulatory Visit: Payer: Self-pay

## 2019-06-13 DIAGNOSIS — R600 Localized edema: Secondary | ICD-10-CM | POA: Diagnosis not present

## 2019-06-13 DIAGNOSIS — R293 Abnormal posture: Secondary | ICD-10-CM

## 2019-06-13 DIAGNOSIS — M6281 Muscle weakness (generalized): Secondary | ICD-10-CM | POA: Diagnosis not present

## 2019-06-13 DIAGNOSIS — M25611 Stiffness of right shoulder, not elsewhere classified: Secondary | ICD-10-CM | POA: Diagnosis not present

## 2019-06-13 DIAGNOSIS — M25511 Pain in right shoulder: Secondary | ICD-10-CM | POA: Diagnosis not present

## 2019-06-13 NOTE — Therapy (Signed)
[place], [place], [address] Phone: [phone]   Fax:  [phone]  Physical Therapy Treatment  Patient Details  Name: Billy Landry MRN: [id number] Date of Birth: 05/30/1950 Referring Provider (PT): Dr Ophelia Charter    Encounter Date: 06/13/2019  PT End of Session - 06/13/19 1143    Visit Number  9    Number of Visits  24    Date for PT Re-Evaluation  08/05/19    Authorization - Visit Number  9    Authorization - Number of Visits  24    PT Start Time  [id number]    PT Stop Time  1229    PT Time Calculation (min)  46 min    Activity Tolerance  Patient tolerated treatment well;No increased pain       Past Medical History:  Diagnosis Date  . Allergy   . Arthritis    hands, hips  . BPH associated with nocturia   . DJD of right shoulder   . Essential hypertension 01/05/2015  . Hypertension   . Shingles 12/2017    Past Surgical History:  Procedure Laterality Date  . CATARACT EXTRACTION Left 2013  . RETINAL DETACHMENT SURGERY  2012  . REVERSE SHOULDER ARTHROPLASTY Right 04/24/2019   Procedure: RIGHT REVERSE SHOULDER ARTHROPLASTY;  Surgeon: Hiram Gash, MD;  Location: [facility];  Service: Orthopedics;  Laterality: Right;  . ROTATOR CUFF REPAIR Right 2010  . TONSILECTOMY, ADENOIDECTOMY, BILATERAL MYRINGOTOMY AND TUBES  1968  . VASECTOMY      There were no vitals filed for this visit.  Subjective Assessment - 06/13/19 1146    Subjective  Sleeping without the sling the past couple of night. Had some aching yesterday morning - may have gotten "over on it" at night.    Currently in Pain?  No/denies                       [facility] Adult PT Treatment/Exercise - 06/13/19 0001      Neuro Re-ed    Neuro Re-ed Details   education re- scapular alignment and posture - working on functional movement Rt UE with maintaining scapular control       Shoulder Exercises: Supine   Other Supine Exercises  small range flex/ext, horizontal ab/ad; circles CW/CCW ~ 90 deg shoulder flexion 10 reps each direction focus on scapular control throughout      Shoulder Exercises: Seated   Other Seated Exercises  working on scapular control with noodle along thoracic spine - working on forward reach and scapular depression with maintained scapular control - numerous reps with short rest breaks     Other Seated Exercises  scap squeeze x 5 sec x 10; shoulder rolls x 10 reps clockwise      Shoulder Exercises: Prone   Extension  AROM;Right;10 reps   to lateral hip - focus on scapular position      Shoulder Exercises: Standing   Other Standing Exercises  scap squeeze with scapular depression 10 sec x 10 reps     Other Standing Exercises  L's and W's x 3 sec hold x 10 reps each (in front of mirror for visual feedback)      Shoulder Exercises: Pulleys   Flexion  --   10 sec hold x 10 reps    Scaption  --   10 sec hold x 10 reps     Shoulder Exercises: Therapy Ball   Flexion  Limitations  ball on wall in shd flexion scapular down and back - trial of small bounce repeated on thigh height surface 30-45 sec x 2 reps       Manual Therapy   Manual therapy comments  pt supine     Soft tissue mobilization  STM to anterior chest/pecs and deltoid; upper trap/levator     Myofascial Release  Rt anterior chest/pecs     Passive ROM  Rt shoulder flexion, scaption, and ER (in neutral position) very gentle shoulder extension - to tissue limits and no pain.                PT Short Term Goals - 06/10/19 1125      PT SHORT TERM GOAL #1   Title  Improve posture and alignment with patient to demonstrate upright posture with posterior shoulder girdle engaged    Time  6    Period  Weeks    Status  Achieved    Target Date  06/24/19      PT SHORT TERM GOAL #2   Title  Increase PROM to 120 deg flexion and scaption; 30+ degrees ER in neutral - progressing per protocol    Time  6    Period   Weeks    Status  Achieved    Target Date  06/24/19      PT SHORT TERM GOAL #3   Title  independent in initial HEP    Time  6    Period  Weeks    Status  Achieved    Target Date  06/24/19      PT SHORT TERM GOAL #4   Title  pain free at rest with no more that 2-3/10 pain following exercise    Time  6    Period  Weeks    Status  Achieved    Target Date  06/24/19        PT Long Term Goals - 05/13/19 1247      PT LONG TERM GOAL #1   Title  Independent in advanced HEP    Time  12    Period  Weeks    Status  New    Target Date  07/17/19      PT LONG TERM GOAL #2   Title  Functional use and independent in all ADL's    Time  12    Period  Weeks    Status  New    Target Date  07/17/19      PT LONG TERM GOAL #3   Title  AROM Rt shoulder WFL's and per protocol    Time  12    Period  Weeks    Status  New    Target Date  07/17/19      PT LONG TERM GOAL #4   Title  4/5 to 4+/5 strength Rt shoulder    Time  12    Period  Weeks    Status  New    Target Date  07/17/19      PT LONG TERM GOAL #5   Title  Plan to return to work - lifting and carrying objects weighing 25-35 pounds    Time  12    Period  Weeks    Status  New    Target Date  07/17/19            Plan - 06/13/19 1236    Clinical Impression Statement  Patient continues to progress well with shoulder rehab within  limitations of protocol. Billy Landry needs to continue working on scapular control for functional activities and exercises of Rt UE.  He will be out of town next week and was instructed to conintue with current HEP.    Rehab Potential  Excellent    PT Frequency  2x / week    PT Duration  12 weeks    PT Treatment/Interventions  ADLs/Self Care Home Management;Cryotherapy;Electrical Stimulation;Iontophoresis 4mg /ml Dexamethasone;Moist Heat;Ultrasound;Therapeutic activities;Therapeutic exercise;Neuromuscular re-education;Patient/family education;Manual techniques;Passive range of motion;Dry needling;Taping     PT Next Visit Plan  progress Rt shoulder ROM and strengthening per rehab protocol.    PT Home Exercise Plan  [id number]    Consulted and Agree with Plan of Care  Patient       Patient will benefit from skilled therapeutic intervention in order to improve the following deficits and impairments:     Visit Diagnosis: Stiffness of right shoulder, not elsewhere classified  Abnormal posture  Muscle weakness (generalized)  Localized edema  Acute pain of right shoulder     Problem List Patient Active Problem List   Diagnosis Date Noted  . Rotator cuff tear arthropathy, right 02/13/2019  . 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 Skin problem 02/17/2016  . Macrocytosis without anemia 02/17/2016  . History of skin cancer 10/26/2015  . Essential hypertension 01/05/2015    Tyleah Loh Nilda Simmer PT, MPH  06/13/2019, 12:44 PM  [facility] [place], [place], [address] Phone: [phone]   Fax:  [phone]  Name: Billy Landry MRN: [id number] Date of Birth: May 22, 1950

## 2019-06-17 ENCOUNTER — Encounter: Payer: Medicare Other | Admitting: Physical Therapy

## 2019-06-20 ENCOUNTER — Encounter: Payer: Medicare Other | Admitting: Rehabilitative and Restorative Service Providers"

## 2019-06-24 ENCOUNTER — Encounter: Payer: Self-pay | Admitting: Rehabilitative and Restorative Service Providers"

## 2019-06-24 ENCOUNTER — Ambulatory Visit (INDEPENDENT_AMBULATORY_CARE_PROVIDER_SITE_OTHER): Payer: Medicare Other | Admitting: Rehabilitative and Restorative Service Providers"

## 2019-06-24 ENCOUNTER — Other Ambulatory Visit: Payer: Self-pay

## 2019-06-24 DIAGNOSIS — M6281 Muscle weakness (generalized): Secondary | ICD-10-CM

## 2019-06-24 DIAGNOSIS — R293 Abnormal posture: Secondary | ICD-10-CM

## 2019-06-24 DIAGNOSIS — R600 Localized edema: Secondary | ICD-10-CM

## 2019-06-24 DIAGNOSIS — M25611 Stiffness of right shoulder, not elsewhere classified: Secondary | ICD-10-CM | POA: Diagnosis not present

## 2019-06-24 DIAGNOSIS — M25511 Pain in right shoulder: Secondary | ICD-10-CM | POA: Diagnosis not present

## 2019-06-24 NOTE — Patient Instructions (Signed)
  Low Row: Standing   Face anchor, feet shoulder width apart. Palms up, pull arms back, squeezing shoulder blades together. Repeat 10__ times per set. Do 2-3__ sets per session. Do 1-2 times/day  Anchor Height: Waist   Hold shoulder blades down and back and step back  Reps as above

## 2019-06-24 NOTE — Therapy (Signed)
[name], [place], [address] Phone: [phone]   Fax:  [phone]  Physical Therapy Treatment  Patient Details  Name: Billy Landry MRN: [id number] Date of Birth: January 11, 1951 Referring Provider (PT): Dr Ophelia Charter   Progress Note Reporting Period 05/13/19 to 06/24/19  See note below for Objective Data and Assessment of Progress/Goals.     .    Encounter Date: 06/24/2019  PT End of Session - 06/24/19 1349    Visit Number  10    Number of Visits  24    Date for PT Re-Evaluation  08/05/19    Authorization - Visit Number  10    Authorization - Number of Visits  24    PT Start Time  1346    PT Stop Time  1430    PT Time Calculation (min)  44 min    Activity Tolerance  Patient tolerated treatment well;No increased pain       Past Medical History:  Diagnosis Date  . Allergy   . Arthritis    hands, hips  . BPH associated with nocturia   . DJD of right shoulder   . Essential hypertension 01/05/2015  . Hypertension   . Shingles 12/2017    Past Surgical History:  Procedure Laterality Date  . CATARACT EXTRACTION Left 2013  . RETINAL DETACHMENT SURGERY  2012  . REVERSE SHOULDER ARTHROPLASTY Right 04/24/2019   Procedure: RIGHT REVERSE SHOULDER ARTHROPLASTY;  Surgeon: Hiram Gash, MD;  Location: [facility];  Service: Orthopedics;  Laterality: Right;  . ROTATOR CUFF REPAIR Right 2010  . TONSILECTOMY, ADENOIDECTOMY, BILATERAL MYRINGOTOMY AND TUBES  1968  . VASECTOMY      There were no vitals filed for this visit.  Subjective Assessment - 06/24/19 1349    Subjective  Out of town last week for his brother-in-laws funeral. Shoulder did ok - just tight. Did work on some of the exercises last week - not as consistent.    Currently in Pain?  No/denies         [facility] PT Assessment - 06/24/19 0001      Assessment   Medical Diagnosis  Rt reverse total shoulder     Referring  Provider (PT)  Dr Ophelia Charter     Onset Date/Surgical Date  04/24/19    Hand Dominance  Right    Next MD Visit  07/18/19    Prior Therapy  after RCR       Posture/Postural Control   Posture Comments  less head forward; shoulders rounded; scapulae abducted       AROM   AROM Assessment Site  Shoulder    Right/Left Shoulder  Right    Right Shoulder Extension  43 Degrees    Right Shoulder Flexion  124 Degrees    Right Shoulder ABduction  120 Degrees      PROM   Right Shoulder Flexion  141 Degrees    Right Shoulder ABduction  137 Degrees   in scapular plane    Right Shoulder External Rotation  65 Degrees   in scapular plane shd ~ 40 deg abd elbow flexed ~ 90 deg      Palpation   Palpation comment  improving muscular tightness Lt upper quarter through pecs; upper trap; leveator; periscapular musculature                    [facility] Adult PT Treatment/Exercise - 06/24/19 0001      Shoulder  Exercises: Seated   Other Seated Exercises  working on scapular control with noodle along thoracic spine - working on forward reach and scapular depression with maintained scapular control - using stick in both hands for movement in various planes     Other Seated Exercises  scap squeeze with stick x 20 reps fwd/back; side to side; circles CW/CCW      Shoulder Exercises: Standing   Flexion  AROM;Both;10 reps    ABduction  AROM;Both;10 reps    Row  Strengthening;Both;10 reps;Theraband   2 sets    Other Standing Exercises  scap squeeze with scapular depression 10 sec x 10 reps     Other Standing Exercises  L's and W's x 3 sec hold x 10 reps each (in front of mirror for visual feedback)      Shoulder Exercises: Pulleys   Flexion  --   10 sec hold x 10 reps    Scaption  --   10 sec hold x 10 reps     Shoulder Exercises: ROM/Strengthening   Other ROM/Strengthening Exercises  shoulder rolls backward in sitting       Shoulder Exercises: Isometric Strengthening   External Rotation  5X5"     Internal Rotation  5X5"    ABduction  5X5"    Other Isometric Exercises  periscapular retraction stepping back with yellow TB x 10       Manual Therapy   Manual therapy comments  pt supine     Soft tissue mobilization  STM to anterior chest/pecs and deltoid; upper trap/levator     Myofascial Release  Rt anterior chest/pecs     Passive ROM  Rt shoulder flexion, scaption, and ER (in neutral position) very gentle shoulder extension - to tissue limits and no pain.              PT Education - 06/24/19 1429    Education Details  HEP    Person(s) Educated  Patient    Methods  Explanation;Demonstration;Tactile cues;Verbal cues;Handout    Comprehension  Verbalized understanding;Returned demonstration;Verbal cues required;Tactile cues required       PT Short Term Goals - 06/10/19 1125      PT SHORT TERM GOAL #1   Title  Improve posture and alignment with patient to demonstrate upright posture with posterior shoulder girdle engaged    Time  6    Period  Weeks    Status  Achieved    Target Date  06/24/19      PT SHORT TERM GOAL #2   Title  Increase PROM to 120 deg flexion and scaption; 30+ degrees ER in neutral - progressing per protocol    Time  6    Period  Weeks    Status  Achieved    Target Date  06/24/19      PT SHORT TERM GOAL #3   Title  independent in initial HEP    Time  6    Period  Weeks    Status  Achieved    Target Date  06/24/19      PT SHORT TERM GOAL #4   Title  pain free at rest with no more that 2-3/10 pain following exercise    Time  6    Period  Weeks    Status  Achieved    Target Date  06/24/19        PT Long Term Goals - 05/13/19 1247      PT LONG TERM GOAL #1   Title  Independent in advanced HEP    Time  12    Period  Weeks    Status  New    Target Date  07/17/19      PT LONG TERM GOAL #2   Title  Functional use and independent in all ADL's    Time  12    Period  Weeks    Status  New    Target Date  07/17/19      PT LONG TERM  GOAL #3   Title  AROM Rt shoulder WFL's and per protocol    Time  12    Period  Weeks    Status  New    Target Date  07/17/19      PT LONG TERM GOAL #4   Title  4/5 to 4+/5 strength Rt shoulder    Time  12    Period  Weeks    Status  New    Target Date  07/17/19      PT LONG TERM GOAL #5   Title  Plan to return to work - lifting and carrying objects weighing 25-35 pounds    Time  12    Period  Weeks    Status  New    Target Date  07/17/19            Plan - 06/24/19 1437    Clinical Impression Statement  Patient continues to progress well with shoulder rehab - treatment dictated by rehab protocol. He demonstrates increased AROM/PROM. He is working on [employer].    Rehab Potential  Excellent    PT Frequency  2x / week    PT Duration  12 weeks    PT Treatment/Interventions  ADLs/Self Care Home Management;Cryotherapy;Electrical Stimulation;Iontophoresis 4mg /ml Dexamethasone;Moist Heat;Ultrasound;Therapeutic activities;Therapeutic exercise;Neuromuscular re-education;Patient/family education;Manual techniques;Passive range of motion;Dry needling;Taping    PT Next Visit Plan  progress Rt shoulder ROM and strengthening per rehab protocol.    PT Home Exercise Plan  [id number]    Consulted and Agree with Plan of Care  Patient       Patient will benefit from skilled therapeutic intervention in order to improve the following deficits and impairments:     Visit Diagnosis: Stiffness of right shoulder, not elsewhere classified  Abnormal posture  Muscle weakness (generalized)  Localized edema  Acute pain of right shoulder     Problem List Patient Active Problem List   Diagnosis Date Noted  . Rotator cuff tear arthropathy, right 02/13/2019  . 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 Skin problem 02/17/2016  . Macrocytosis without anemia 02/17/2016  . History of skin cancer 10/26/2015  . Essential hypertension 01/05/2015    Roshawna Colclasure Nilda Simmer PT, MPH   06/24/2019, 2:41 PM  [facility] [place], [place], [address] Phone: [phone]   Fax:  [phone]  Name: Mudasir Lanni MRN: [id number] Date of Birth: [birth year]

## 2019-06-27 ENCOUNTER — Other Ambulatory Visit: Payer: Self-pay

## 2019-06-27 ENCOUNTER — Ambulatory Visit (INDEPENDENT_AMBULATORY_CARE_PROVIDER_SITE_OTHER): Payer: Medicare Other | Admitting: Rehabilitative and Restorative Service Providers"

## 2019-06-27 DIAGNOSIS — M6281 Muscle weakness (generalized): Secondary | ICD-10-CM

## 2019-06-27 DIAGNOSIS — R600 Localized edema: Secondary | ICD-10-CM | POA: Diagnosis not present

## 2019-06-27 DIAGNOSIS — M25611 Stiffness of right shoulder, not elsewhere classified: Secondary | ICD-10-CM

## 2019-06-27 DIAGNOSIS — M25511 Pain in right shoulder: Secondary | ICD-10-CM | POA: Diagnosis not present

## 2019-06-27 DIAGNOSIS — R293 Abnormal posture: Secondary | ICD-10-CM

## 2019-06-27 NOTE — Therapy (Signed)
[place], [place], [address] Phone: [phone]   Fax:  [phone]  Physical Therapy Treatment  Patient Details  Name: Billy Landry MRN: [id number] Date of Birth: 08/27/1950 Referring Provider (PT): Dr Ophelia Charter    Encounter Date: 06/27/2019  PT End of Session - 06/27/19 1402    Visit Number  11    Number of Visits  24    Date for PT Re-Evaluation  08/05/19    Authorization - Visit Number  11    Authorization - Number of Visits  24    PT Start Time  1400    PT Stop Time  [id number]    PT Time Calculation (min)  45 min    Activity Tolerance  Patient tolerated treatment well       Past Medical History:  Diagnosis Date  . Allergy   . Arthritis    hands, hips  . BPH associated with nocturia   . DJD of right shoulder   . Essential hypertension 01/05/2015  . Hypertension   . Shingles 12/2017    Past Surgical History:  Procedure Laterality Date  . CATARACT EXTRACTION Left 2013  . RETINAL DETACHMENT SURGERY  2012  . REVERSE SHOULDER ARTHROPLASTY Right 04/24/2019   Procedure: RIGHT REVERSE SHOULDER ARTHROPLASTY;  Surgeon: Hiram Gash, MD;  Location: [facility];  Service: Orthopedics;  Laterality: Right;  . ROTATOR CUFF REPAIR Right 2010  . TONSILECTOMY, ADENOIDECTOMY, BILATERAL MYRINGOTOMY AND TUBES  1968  . VASECTOMY      There were no vitals filed for this visit.  Subjective Assessment - 06/27/19 1403    Subjective  Shoulder is "good". He has been sleeping on the Rt shoulder some.    Currently in Pain?  No/denies                       [facility] Adult PT Treatment/Exercise - 06/27/19 0001      Shoulder Exercises: Standing   Flexion  AROM;Both;10 reps    ABduction  AROM;Both;10 reps    Row  Strengthening;Both;10 reps;Theraband   2 sets    Row Limitations  isometric row with TB pt holding band in row position and stepping back     Shoulder Elevation  Limitations  active deltoid initiation in sitting UE on pillow - elbow 90 deg flexion lifting arm ~ 8 in forearm II to floor     Other Standing Exercises  scap squeeze with scapular depression 10 sec x 10 reps     Other Standing Exercises  L's and W's x 3 sec hold x 10 reps each (in front of mirror for visual feedback)      Shoulder Exercises: ROM/Strengthening   Other ROM/Strengthening Exercises  shoulder rolls backward in sitting       Shoulder Exercises: Isometric Strengthening   Other Isometric Exercises  periscapular retraction stepping back with yellow TB x 10       Vasopneumatic   Number Minutes Vasopneumatic   10 minutes    Vasopnuematic Location   Shoulder   Lt   Vasopneumatic Pressure  Low    Vasopneumatic Temperature   34 deg      Manual Therapy   Manual therapy comments  pt supine and Lt sidelying     Soft tissue mobilization  STM to anterior chest/pecs and deltoid; upper trap/levator     Myofascial Release  Rt anterior chest/pecs     Passive ROM  Rt  shoulder flexion, scaption, and ER (in neutral position) very gentle shoulder extension - to tissue limits and no pain.                PT Short Term Goals - 06/10/19 1125      PT SHORT TERM GOAL #1   Title  Improve posture and alignment with patient to demonstrate upright posture with posterior shoulder girdle engaged    Time  6    Period  Weeks    Status  Achieved    Target Date  06/24/19      PT SHORT TERM GOAL #2   Title  Increase PROM to 120 deg flexion and scaption; 30+ degrees ER in neutral - progressing per protocol    Time  6    Period  Weeks    Status  Achieved    Target Date  06/24/19      PT SHORT TERM GOAL #3   Title  independent in initial HEP    Time  6    Period  Weeks    Status  Achieved    Target Date  06/24/19      PT SHORT TERM GOAL #4   Title  pain free at rest with no more that 2-3/10 pain following exercise    Time  6    Period  Weeks    Status  Achieved    Target Date   06/24/19        PT Long Term Goals - 05/13/19 1247      PT LONG TERM GOAL #1   Title  Independent in advanced HEP    Time  12    Period  Weeks    Status  New    Target Date  07/17/19      PT LONG TERM GOAL #2   Title  Functional use and independent in all ADL's    Time  12    Period  Weeks    Status  New    Target Date  07/17/19      PT LONG TERM GOAL #3   Title  AROM Rt shoulder WFL's and per protocol    Time  12    Period  Weeks    Status  New    Target Date  07/17/19      PT LONG TERM GOAL #4   Title  4/5 to 4+/5 strength Rt shoulder    Time  12    Period  Weeks    Status  New    Target Date  07/17/19      PT LONG TERM GOAL #5   Title  Plan to return to work - lifting and carrying objects weighing 25-35 pounds    Time  12    Period  Weeks    Status  New    Target Date  07/17/19            Plan - 06/27/19 1442    Clinical Impression Statement  No pain. Progressing well with rehab per MD protocol. Patient has palpable muscular tightness through the Rt pecs; upper trap; leveator; biceps; deltoid; teres. Responded well to manual work today.    Rehab Potential  Excellent    PT Frequency  2x / week    PT Duration  12 weeks    PT Treatment/Interventions  ADLs/Self Care Home Management;Cryotherapy;Electrical Stimulation;Iontophoresis 4mg /ml Dexamethasone;Moist Heat;Ultrasound;Therapeutic activities;Therapeutic exercise;Neuromuscular re-education;Patient/family education;Manual techniques;Passive range of motion;Dry needling;Taping    PT Next Visit Plan  progress  Rt shoulder ROM and strengthening per rehab protocol.    PT Home Exercise Plan  [id number]    Consulted and Agree with Plan of Care  Patient       Patient will benefit from skilled therapeutic intervention in order to improve the following deficits and impairments:     Visit Diagnosis: Stiffness of right shoulder, not elsewhere classified  Abnormal posture  Muscle weakness  (generalized)  Localized edema  Acute pain of right shoulder     Problem List Patient Active Problem List   Diagnosis Date Noted  . Rotator cuff tear arthropathy, right 02/13/2019  . 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 Skin problem 02/17/2016  . Macrocytosis without anemia 02/17/2016  . History of skin cancer 10/26/2015  . Essential hypertension 01/05/2015    Rudene Poulsen Nilda Simmer PT, MPH  06/27/2019, 2:46 PM  [facility] [place] [address] [place], [place], [address] Phone: [phone]   Fax:  [phone]  Name: Waylan Lybbert MRN: [id number] Date of Birth: [birth year]

## 2019-07-01 ENCOUNTER — Ambulatory Visit (INDEPENDENT_AMBULATORY_CARE_PROVIDER_SITE_OTHER): Payer: Medicare Other | Admitting: Physical Therapy

## 2019-07-01 ENCOUNTER — Encounter: Payer: Self-pay | Admitting: Physical Therapy

## 2019-07-01 ENCOUNTER — Other Ambulatory Visit: Payer: Self-pay

## 2019-07-01 DIAGNOSIS — R293 Abnormal posture: Secondary | ICD-10-CM | POA: Diagnosis not present

## 2019-07-01 DIAGNOSIS — M6281 Muscle weakness (generalized): Secondary | ICD-10-CM | POA: Diagnosis not present

## 2019-07-01 DIAGNOSIS — M25611 Stiffness of right shoulder, not elsewhere classified: Secondary | ICD-10-CM

## 2019-07-01 DIAGNOSIS — R600 Localized edema: Secondary | ICD-10-CM

## 2019-07-01 NOTE — Patient Instructions (Signed)
Access Code: [email or web address]: [email or web address]: 04/05/2021Prepared by: [place]  Supine Shoulder Flexion with Dowel - 1 x daily - 7 x weekly - 1 sets - 5 reps - 10 hold  Supine Shoulder Flexion with Free Weight - 1 x daily - 7 x weekly - 10 reps - 3 sets  Standing Shoulder Internal Rotation Stretch with Hands Behind Back - 2 x daily - 7 x weekly - 3 reps - 1 sets - 10 sec hold  Shoulder External Rotation with Anchored Resistance with Towel Under Elbow - 1 x daily - 7 x weekly - 2 sets - 10 reps  Shoulder Internal Rotation with Resistance - 1 x daily - 7 x weekly - 2 sets - 10 reps  Standing Row with Anchored Resistance - 1 x daily - 7 x weekly - 2 sets - 10 reps  Seated Single Arm Shoulder Scaption with Dumbbell - 1 x daily - 7 x weekly - 1 sets - 10 reps

## 2019-07-01 NOTE — Therapy (Signed)
[place], [place], [address] Phone: [phone]   Fax:  [phone]  Physical Therapy Treatment  Patient Details  Name: Blayden Conwell MRN: [id number] Date of Birth: [birth year] Referring Provider (PT): Dr Ophelia Charter    Encounter Date: 07/01/2019  PT End of Session - 07/01/19 1100    Visit Number  12    Number of Visits  24    Date for PT Re-Evaluation  08/05/19    Authorization - Visit Number  12    Authorization - Number of Visits  24    PT Start Time  1100    PT Stop Time  1140    PT Time Calculation (min)  40 min    Activity Tolerance  Patient tolerated treatment well    Behavior During Therapy  [facility] for tasks assessed/performed       Past Medical History:  Diagnosis Date  . Allergy   . Arthritis    hands, hips  . BPH associated with nocturia   . DJD of right shoulder   . Essential hypertension 01/05/2015  . Hypertension   . Shingles 12/2017    Past Surgical History:  Procedure Laterality Date  . CATARACT EXTRACTION Left 2013  . RETINAL DETACHMENT SURGERY  2012  . REVERSE SHOULDER ARTHROPLASTY Right 04/24/2019   Procedure: RIGHT REVERSE SHOULDER ARTHROPLASTY;  Surgeon: Hiram Gash, MD;  Location: [facility];  Service: Orthopedics;  Laterality: Right;  . ROTATOR CUFF REPAIR Right 2010  . TONSILECTOMY, ADENOIDECTOMY, BILATERAL MYRINGOTOMY AND TUBES  1968  . VASECTOMY      There were no vitals filed for this visit.  Subjective Assessment - 07/01/19 1102    Subjective  Pt reports his Rt shoulder has been doing well. No new changes.    Pertinent History  Rt RCR ~ 10 yrs ago, detached retina years ago; arthritis hands; HTN    Patient Stated Goals  full use of arm - golfing; tennis; household chores/activities    Currently in Pain?  No/denies    Pain Score  0-No pain         [facility] PT Assessment - 07/01/19 0001      Assessment   Medical Diagnosis  Rt reverse  total shoulder     Referring Provider (PT)  Dr Ophelia Charter     Onset Date/Surgical Date  04/24/19    Hand Dominance  Right    Next MD Visit  07/18/19    Prior Therapy  after RCR       AROM   AROM Assessment Site  Shoulder   taken in standing    Right/Left Shoulder  Right    Right Shoulder Extension  43 Degrees    Right Shoulder Flexion  130 Degrees    Right Shoulder ABduction  125 Degrees    Right Shoulder Internal Rotation  30 Degrees   abdct 70 deg   Right Shoulder External Rotation  70 Degrees   abdct 70 deg     PROM   Right Shoulder Flexion  150 Degrees    Right Shoulder External Rotation  65 Degrees      [facility] Adult PT Treatment/Exercise - 07/01/19 0001      Elbow Exercises   Elbow Flexion  Strengthening;Both;20 reps;Standing;Bar weights/barbell   3#     Shoulder Exercises: Supine   Flexion  Strengthening;Right;10 reps;Weights    Shoulder Flexion Weight (lbs)  1, 2    Flexion Limitations  and 5  reps of AROM to end range prior to weights.     Other Supine Exercises  scaption to 100 deg with 1# RUE x 10      Shoulder Exercises: Sidelying   External Rotation  Strengthening;Right;10 reps;Weights   3 sec hold.    External Rotation Weight (lbs)  1      Shoulder Exercises: Standing   External Rotation  Strengthening;Right;10 reps;Theraband    Theraband Level (Shoulder External Rotation)  Level 1 (Yellow)    Internal Rotation  Strengthening;Right;10 reps;Theraband    Theraband Level (Shoulder Internal Rotation)  Level 1 (Yellow)    ABduction  Strengthening;Right;10 reps - to 90 deg   Shoulder ABduction Weight (lbs)  2    Row  Strengthening;Both;10 reps;Theraband   2 sets    Theraband Level (Shoulder Row)  Level 1 (Yellow)    Row Limitations  reactive isometric, 1st set, then 10 reg row      Shoulder Exercises: Pulleys   Flexion  --   10 sec hold x 10 reps    Scaption  --   10 sec hold x 10 reps        PT Education - 07/01/19 1146    Education Details  HEP  updated    Person(s) Educated  Patient    Methods  Explanation;Handout;Verbal cues;Demonstration;Tactile cues    Comprehension  Verbalized understanding;Returned demonstration       PT Short Term Goals - 06/10/19 1125      PT SHORT TERM GOAL #1   Title  Improve posture and alignment with patient to demonstrate upright posture with posterior shoulder girdle engaged    Time  6    Period  Weeks    Status  Achieved    Target Date  06/24/19      PT SHORT TERM GOAL #2   Title  Increase PROM to 120 deg flexion and scaption; 30+ degrees ER in neutral - progressing per protocol    Time  6    Period  Weeks    Status  Achieved    Target Date  06/24/19      PT SHORT TERM GOAL #3   Title  independent in initial HEP    Time  6    Period  Weeks    Status  Achieved    Target Date  06/24/19      PT SHORT TERM GOAL #4   Title  pain free at rest with no more that 2-3/10 pain following exercise    Time  6    Period  Weeks    Status  Achieved    Target Date  06/24/19        PT Long Term Goals - 07/01/19 1106      PT LONG TERM GOAL #1   Title  Independent in advanced HEP    Time  12    Period  Weeks    Status  On-going      PT LONG TERM GOAL #2   Title  Functional use and independent in all ADL's    Time  12    Period  Weeks    Status  Achieved      PT LONG TERM GOAL #3   Title  AROM Rt shoulder WFL's and per protocol    Time  12    Period  Weeks    Status  On-going      PT LONG TERM GOAL #4   Title  4/5 to 4+/5 strength Rt  shoulder    Time  12    Period  Weeks    Status  On-going      PT LONG TERM GOAL #5   Title  Plan to return to work - lifting and carrying objects weighing 25-35 pounds    Time  12    Period  Weeks    Status  On-going            Plan - 07/01/19 1637    Clinical Impression Statement  Pt is now 9 wks s/p reverse total shoulder surgery and has entered next phase of rehab protocol.  He tolerated adding light resistance with ROM exercises,  without pain or difficulty.  He has met LTG#2 and is progressing well towards remaining goals.  Pt will benefit from continued PT intervention to maximize functional mobility independence.    Rehab Potential  Excellent    PT Frequency  2x / week    PT Duration  12 weeks    PT Treatment/Interventions  ADLs/Self Care Home Management;Cryotherapy;Electrical Stimulation;Iontophoresis 63m/ml Dexamethasone;Moist Heat;Ultrasound;Therapeutic activities;Therapeutic exercise;Neuromuscular re-education;Patient/family education;Manual techniques;Passive range of motion;Dry needling;Taping    PT Next Visit Plan  progress Rt shoulder ROM and strengthening per rehab protocol.  Pt returns to MD 07/18/19.    PT Home Exercise Plan  [email or web address]    Consulted and Agree with Plan of Care  Patient       Patient will benefit from skilled therapeutic intervention in order to improve the following deficits and impairments:  Decreased range of motion, Increased fascial restricitons, Impaired UE functional use, Decreased activity tolerance, Pain, Improper body mechanics, Decreased strength, Postural dysfunction  Visit Diagnosis: Stiffness of right shoulder, not elsewhere classified  Abnormal posture  Muscle weakness (generalized)  Localized edema     Problem List Patient Active Problem List   Diagnosis Date Noted  . Rotator cuff tear arthropathy, right 02/13/2019  . 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 Skin problem 02/17/2016  . Macrocytosis without anemia 02/17/2016  . History of skin cancer 10/26/2015  . Essential hypertension 01/05/2015   JKerin Perna PTA 07/01/19 4:44 PM  [place] [address]Phone: [phone]  Fax:  [phone] Name: RSheddrick LattanzioMRN: [id number] of Birth: [birth year]

## 2019-07-04 ENCOUNTER — Other Ambulatory Visit: Payer: Self-pay

## 2019-07-04 ENCOUNTER — Ambulatory Visit (INDEPENDENT_AMBULATORY_CARE_PROVIDER_SITE_OTHER): Payer: Medicare Other | Admitting: Physical Therapy

## 2019-07-04 ENCOUNTER — Encounter: Payer: Self-pay | Admitting: Physical Therapy

## 2019-07-04 DIAGNOSIS — M6281 Muscle weakness (generalized): Secondary | ICD-10-CM

## 2019-07-04 DIAGNOSIS — M25611 Stiffness of right shoulder, not elsewhere classified: Secondary | ICD-10-CM | POA: Diagnosis not present

## 2019-07-04 DIAGNOSIS — R600 Localized edema: Secondary | ICD-10-CM

## 2019-07-04 DIAGNOSIS — R293 Abnormal posture: Secondary | ICD-10-CM

## 2019-07-04 NOTE — Therapy (Signed)
[facility] [place], [place], [address] Phone: [phone]   Fax:  [phone]  Physical Therapy Treatment  Patient Details  Name: Billy Landry MRN: [id number] Date of Birth: [birth year] Referring Provider (PT): Dr Ophelia Charter    Encounter Date: 07/04/2019  PT End of Session - 07/04/19 1108    Visit Number  13    Number of Visits  24    Date for PT Re-Evaluation  08/05/19    Authorization - Visit Number  13    Authorization - Number of Visits  24    PT Start Time  [id number]    PT Stop Time  [id number]    PT Time Calculation (min)  40 min    Activity Tolerance  Patient tolerated treatment well;No increased pain    Behavior During Therapy  WFL for tasks assessed/performed       Past Medical History:  Diagnosis Date  . Allergy   . Arthritis    hands, hips  . BPH associated with nocturia   . DJD of right shoulder   . Essential hypertension 01/05/2015  . Hypertension   . Shingles 12/2017    Past Surgical History:  Procedure Laterality Date  . CATARACT EXTRACTION Left 2013  . RETINAL DETACHMENT SURGERY  2012  . REVERSE SHOULDER ARTHROPLASTY Right 04/24/2019   Procedure: RIGHT REVERSE SHOULDER ARTHROPLASTY;  Surgeon: Hiram Gash, MD;  Location: [facility];  Service: Orthopedics;  Laterality: Right;  . ROTATOR CUFF REPAIR Right 2010  . TONSILECTOMY, ADENOIDECTOMY, BILATERAL MYRINGOTOMY AND TUBES  1968  . VASECTOMY      There were no vitals filed for this visit.  Subjective Assessment - 07/04/19 1108    Subjective  Pt reports he accidentally rolled over and slept on his Rt shoulder for 30 min. He's a little sore this morning.    Patient Stated Goals  full use of arm - golfing; tennis; household chores/activities    Currently in Pain?  Yes    Pain Score  2     Pain Location  Shoulder    Pain Orientation  Right    Pain Descriptors / Indicators  Sore         [facility] PT Assessment - 07/04/19  0001      Assessment   Medical Diagnosis  Rt reverse total shoulder     Referring Provider (PT)  Dr Ophelia Charter     Onset Date/Surgical Date  04/24/19    Hand Dominance  Right    Next MD Visit  07/18/19    Prior Therapy  after RCR       [facility] Adult PT Treatment/Exercise - 07/04/19 0001      Elbow Exercises   Elbow Flexion  Strengthening;Both;10 reps;Seated;Theraband   yellow band      Shoulder Exercises: Sidelying   External Rotation  Strengthening;Right;10 reps;Weights   3 sec hold.    External Rotation Weight (lbs)  2    Flexion  Right;10 reps;AROM    ABduction  AROM;Right;10 reps   to 90 deg, slow and controlled.       Shoulder Exercises: Standing   External Rotation  Strengthening;Both;10 reps;Theraband    Theraband Level (Shoulder External Rotation)  Level 1 (Yellow)    Extension  Both;10 reps;Strengthening;Theraband    Theraband Level (Shoulder Extension)  Level 1 (Yellow)    Extension Limitations  to neutral.     Row  Strengthening;Both;10 reps;Theraband   2 sets  Theraband Level (Shoulder Row)  Level 1 (Yellow)    Other Standing Exercises  wall ladder in Rt shoulder scaption x 5 reps, up to #29.       Shoulder Exercises: Pulleys   Flexion  --   10 sec hold x 10 reps    Scaption  --   10 sec hold x 10 reps     Shoulder Exercises: Stretch   Table Stretch - Flexion  3 reps;20 seconds   arms on back of truck.      Manual Therapy   Soft tissue mobilization  STM to anterior chest/pecs and deltoid; upper trap/levator     Passive ROM  Rt shoulder ER (arm in neutral)               PT Short Term Goals - 06/10/19 1125      PT SHORT TERM GOAL #1   Title  Improve posture and alignment with patient to demonstrate upright posture with posterior shoulder girdle engaged    Time  6    Period  Weeks    Status  Achieved    Target Date  06/24/19      PT SHORT TERM GOAL #2   Title  Increase PROM to 120 deg flexion and scaption; 30+ degrees ER in neutral -  progressing per protocol    Time  6    Period  Weeks    Status  Achieved    Target Date  06/24/19      PT SHORT TERM GOAL #3   Title  independent in initial HEP    Time  6    Period  Weeks    Status  Achieved    Target Date  06/24/19      PT SHORT TERM GOAL #4   Title  pain free at rest with no more that 2-3/10 pain following exercise    Time  6    Period  Weeks    Status  Achieved    Target Date  06/24/19        PT Long Term Goals - 07/01/19 1106      PT LONG TERM GOAL #1   Title  Independent in advanced HEP    Time  12    Period  Weeks    Status  On-going      PT LONG TERM GOAL #2   Title  Functional use and independent in all ADL's    Time  12    Period  Weeks    Status  Achieved      PT LONG TERM GOAL #3   Title  AROM Rt shoulder WFL's and per protocol    Time  12    Period  Weeks    Status  On-going      PT LONG TERM GOAL #4   Title  4/5 to 4+/5 strength Rt shoulder    Time  12    Period  Weeks    Status  On-going      PT LONG TERM GOAL #5   Title  Plan to return to work - lifting and carrying objects weighing 25-35 pounds    Time  12    Period  Weeks    Status  On-going            Plan - 07/04/19 1213    Clinical Impression Statement  Pt continues to tolerate light resistance and AROM with good positioning of scapula and no pain.  Pt progressing well  towards remaining goals.    Rehab Potential  Excellent    PT Frequency  2x / week    PT Duration  12 weeks    PT Treatment/Interventions  ADLs/Self Care Home Management;Cryotherapy;Electrical Stimulation;Iontophoresis 4mg /ml Dexamethasone;Moist Heat;Ultrasound;Therapeutic activities;Therapeutic exercise;Neuromuscular re-education;Patient/family education;Manual techniques;Passive range of motion;Dry needling;Taping    PT Next Visit Plan  progress Rt shoulder ROM and strengthening per rehab protocol.  Pt returns to MD 07/18/19.    PT Home Exercise Plan  [email or web address]    Consulted and Agree with Plan of  Care  Patient       Patient will benefit from skilled therapeutic intervention in order to improve the following deficits and impairments:  Decreased range of motion, Increased fascial restricitons, Impaired UE functional use, Decreased activity tolerance, Pain, Improper body mechanics, Decreased strength, Postural dysfunction  Visit Diagnosis: Stiffness of right shoulder, not elsewhere classified  Abnormal posture  Muscle weakness (generalized)  Localized edema     Problem List Patient Active Problem List   Diagnosis Date Noted  . Rotator cuff tear arthropathy, right 02/13/2019  . 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 Skin problem 02/17/2016  . Macrocytosis without anemia 02/17/2016  . History of skin cancer 10/26/2015  . Essential hypertension 01/05/2015   Kerin Perna, PTA 07/04/19 12:16 PM  [place], [place], [address] Phone: [phone]   Fax:  [phone]  Name: Billy Landry MRN: [id number] Date of Birth: 1950/10/04

## 2019-07-08 ENCOUNTER — Encounter: Payer: Self-pay | Admitting: Rehabilitative and Restorative Service Providers"

## 2019-07-08 ENCOUNTER — Other Ambulatory Visit: Payer: Self-pay

## 2019-07-08 ENCOUNTER — Ambulatory Visit (INDEPENDENT_AMBULATORY_CARE_PROVIDER_SITE_OTHER): Payer: Medicare Other | Admitting: Rehabilitative and Restorative Service Providers"

## 2019-07-08 DIAGNOSIS — R600 Localized edema: Secondary | ICD-10-CM | POA: Diagnosis not present

## 2019-07-08 DIAGNOSIS — M6281 Muscle weakness (generalized): Secondary | ICD-10-CM

## 2019-07-08 DIAGNOSIS — M25611 Stiffness of right shoulder, not elsewhere classified: Secondary | ICD-10-CM

## 2019-07-08 DIAGNOSIS — M25511 Pain in right shoulder: Secondary | ICD-10-CM

## 2019-07-08 DIAGNOSIS — R293 Abnormal posture: Secondary | ICD-10-CM
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
## 2019-07-08 NOTE — Patient Instructions (Signed)
Access Code: [email or web address]: [email or web address]: 04/12/2021Prepared by: Syncere Eble HoltExercises  Supine Shoulder Flexion with Dowel - 1 x daily - 7 x weekly - 1 sets - 5 reps - 10 hold  Supine Shoulder Flexion with Free Weight - 1 x daily - 7 x weekly - 10 reps - 3 sets  Standing Shoulder Internal Rotation Stretch with Hands Behind Back - 2 x daily - 7 x weekly - 3 reps - 1 sets - 10 sec hold  Shoulder External Rotation with Anchored Resistance with Towel Under Elbow - 1 x daily - 7 x weekly - 2 sets - 10 reps  Shoulder Internal Rotation with Resistance - 1 x daily - 7 x weekly - 2 sets - 10 reps  Standing Row with Anchored Resistance - 1 x daily - 7 x weekly - 2 sets - 10 reps  Seated Single Arm Shoulder Scaption with Dumbbell - 1 x daily - 7 x weekly - 1 sets - 10 reps  Shoulder Internal Rotation with Resistance - 1 x daily - 7 x weekly - 1 sets - 10 reps - 3 sec hold

## 2019-07-11 ENCOUNTER — Other Ambulatory Visit: Payer: Self-pay

## 2019-07-11 ENCOUNTER — Ambulatory Visit (INDEPENDENT_AMBULATORY_CARE_PROVIDER_SITE_OTHER): Payer: Medicare Other | Admitting: Physical Therapy

## 2019-07-11 DIAGNOSIS — R293 Abnormal posture: Secondary | ICD-10-CM | POA: Diagnosis not present

## 2019-07-11 DIAGNOSIS — M25611 Stiffness of right shoulder, not elsewhere classified: Secondary | ICD-10-CM

## 2019-07-11 DIAGNOSIS — M6281 Muscle weakness (generalized): Secondary | ICD-10-CM | POA: Diagnosis not present

## 2019-07-11 NOTE — Therapy (Signed)
[place], [place], [address] Phone: [phone]   Fax:  [phone]  Physical Therapy Treatment  Patient Details  Name: Billy Landry MRN: [id number] Date of Birth: [birth year] Referring Provider (PT): Dr Ophelia Charter    Encounter Date: 07/11/2019  PT End of Session - 07/11/19 1138    Visit Number  15    Number of Visits  24    Date for PT Re-Evaluation  08/05/19    Authorization - Number of Visits  24    PT Start Time  1100    PT Stop Time  1139    PT Time Calculation (min)  39 min    Activity Tolerance  Patient tolerated treatment well;No increased pain    Behavior During Therapy  WFL for tasks assessed/performed       Past Medical History:  Diagnosis Date  . Allergy   . Arthritis    hands, hips  . BPH associated with nocturia   . DJD of right shoulder   . Essential hypertension 01/05/2015  . Hypertension   . Shingles 12/2017    Past Surgical History:  Procedure Laterality Date  . CATARACT EXTRACTION Left 2013  . RETINAL DETACHMENT SURGERY  2012  . REVERSE SHOULDER ARTHROPLASTY Right 04/24/2019   Procedure: RIGHT REVERSE SHOULDER ARTHROPLASTY;  Surgeon: Hiram Gash, MD;  Location: [facility];  Service: Orthopedics;  Laterality: Right;  . ROTATOR CUFF REPAIR Right 2010  . TONSILECTOMY, ADENOIDECTOMY, BILATERAL MYRINGOTOMY AND TUBES  1968  . VASECTOMY      There were no vitals filed for this visit.  Subjective Assessment - 07/11/19 1116    Subjective  Pt reports some tightness and pain in Rt upper trap. He didn't sleep well last night.  He is having a massage this afternoon. "Overall, I feel really good" regarding progress.    Patient Stated Goals  full use of arm - golfing; tennis; household chores/activities    Currently in Pain?  Yes    Pain Score  1     Pain Location  Shoulder    Pain Orientation  Right;Upper    Pain Descriptors / Indicators  Sore          [facility] PT Assessment - 07/11/19 0001      Assessment   Medical Diagnosis  Rt reverse total shoulder     Referring Provider (PT)  Dr Ophelia Charter     Onset Date/Surgical Date  04/24/19    Hand Dominance  Right    Next MD Visit  07/18/19    Prior Therapy  after RCR                    [facility] Adult PT Treatment/Exercise - 07/11/19 0001      Shoulder Exercises: Supine   Protraction  Right;10 reps;AROM    Horizontal ABduction  Both;10 reps;Strengthening    Theraband Level (Shoulder Horizontal ABduction)  Level 1 (Yellow)    Flexion  Strengthening;Both;10 reps   overhead pull, range to tolerance   Theraband Level (Shoulder Flexion)  Level 1 (Yellow)    Diagonals  Right;10 reps;Strengthening    Theraband Level (Shoulder Diagonals)  Level 1 (Yellow)      Shoulder Exercises: Sidelying   Other Sidelying Exercises  Lt sidelying- gentle Rt thoracic rotation / open book x 8 reps       Shoulder Exercises: Standing   Horizontal ABduction  Both;10 reps;Theraband    Theraband Level (Shoulder  Horizontal ABduction)  Level 1 (Yellow)    External Rotation  Strengthening;Both;10 reps    Theraband Level (Shoulder External Rotation)  Level 1 (Yellow)    Flexion  AROM;Both;5 reps    Extension  Strengthening;Both;Theraband;10 reps    Theraband Level (Shoulder Extension)  Level 1 (Yellow)    Extension Limitations  to neutral.     Row  Strengthening;Both;Theraband;10 reps    Theraband Level (Shoulder Row)  Level 1 (Yellow)      Shoulder Exercises: Pulleys   Flexion  --   10 sec hold x 10 reps    Scaption  --   10 sec hold x 10 reps     Manual Therapy   Soft tissue mobilization  IASTM to Rt upper trap and rhomboid to decrease fascial restrictions and pain.     Myofascial Release  Rt anterior chest/pecs     Passive ROM  Rt shoulder ER (arm in neutral)               PT Short Term Goals - 06/10/19 1125      PT SHORT TERM GOAL #1   Title  Improve posture and alignment with  patient to demonstrate upright posture with posterior shoulder girdle engaged    Time  6    Period  Weeks    Status  Achieved    Target Date  06/24/19      PT SHORT TERM GOAL #2   Title  Increase PROM to 120 deg flexion and scaption; 30+ degrees ER in neutral - progressing per protocol    Time  6    Period  Weeks    Status  Achieved    Target Date  06/24/19      PT SHORT TERM GOAL #3   Title  independent in initial HEP    Time  6    Period  Weeks    Status  Achieved    Target Date  06/24/19      PT SHORT TERM GOAL #4   Title  pain free at rest with no more that 2-3/10 pain following exercise    Time  6    Period  Weeks    Status  Achieved    Target Date  06/24/19        PT Long Term Goals - 07/01/19 1106      PT LONG TERM GOAL #1   Title  Independent in advanced HEP    Time  12    Period  Weeks    Status  On-going      PT LONG TERM GOAL #2   Title  Functional use and independent in all ADL's    Time  12    Period  Weeks    Status  Achieved      PT LONG TERM GOAL #3   Title  AROM Rt shoulder WFL's and per protocol    Time  12    Period  Weeks    Status  On-going      PT LONG TERM GOAL #4   Title  4/5 to 4+/5 strength Rt shoulder    Time  12    Period  Weeks    Status  On-going      PT LONG TERM GOAL #5   Title  Plan to return to work - lifting and carrying objects weighing 25-35 pounds    Time  12    Period  Weeks    Status  On-going  Plan - 07/11/19 1157    Clinical Impression Statement  Pt now 11 wks post-op reverse TSA.  He is tolerating light resistance exercises with Rt arm without difficulty. Progressing well towards LTGs. Pt returns to MD 07/18/19.    Rehab Potential  Excellent    PT Frequency  2x / week    PT Duration  12 weeks    PT Treatment/Interventions  ADLs/Self Care Home Management;Cryotherapy;Electrical Stimulation;Iontophoresis 4mg /ml Dexamethasone;Moist Heat;Ultrasound;Therapeutic activities;Therapeutic  exercise;Neuromuscular re-education;Patient/family education;Manual techniques;Passive range of motion;Dry needling;Taping    PT Next Visit Plan  progress Rt shoulder ROM and strengthening per rehab protocol.    PT Home Exercise Plan  [email or web address]    Consulted and Agree with Plan of Care  Patient       Patient will benefit from skilled therapeutic intervention in order to improve the following deficits and impairments:  Decreased range of motion, Increased fascial restricitons, Impaired UE functional use, Decreased activity tolerance, Pain, Improper body mechanics, Decreased strength, Postural dysfunction  Visit Diagnosis: Stiffness of right shoulder, not elsewhere classified  Abnormal posture  Muscle weakness (generalized)     Problem List Patient Active Problem List   Diagnosis Date Noted  . Rotator cuff tear arthropathy, right 02/13/2019  . 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 Skin problem 02/17/2016  . Macrocytosis without anemia 02/17/2016  . History of skin cancer 10/26/2015  . Essential hypertension 01/05/2015   Kerin Perna, PTA 07/11/19 12:02 PM  [place], [place], [address] Phone: [phone]   Fax:  [phone]  Name: Treyor Cavazos MRN: [id number] Date of Birth: Apr 03, 1950

## 2019-07-15 ENCOUNTER — Ambulatory Visit (INDEPENDENT_AMBULATORY_CARE_PROVIDER_SITE_OTHER): Payer: Medicare Other | Admitting: Rehabilitative and Restorative Service Providers"

## 2019-07-15 ENCOUNTER — Encounter: Payer: Self-pay | Admitting: Rehabilitative and Restorative Service Providers"

## 2019-07-15 ENCOUNTER — Other Ambulatory Visit: Payer: Self-pay

## 2019-07-15 DIAGNOSIS — R600 Localized edema: Secondary | ICD-10-CM

## 2019-07-15 DIAGNOSIS — M6281 Muscle weakness (generalized): Secondary | ICD-10-CM

## 2019-07-15 DIAGNOSIS — M25611 Stiffness of right shoulder, not elsewhere classified: Secondary | ICD-10-CM | POA: Diagnosis not present

## 2019-07-15 DIAGNOSIS — R293 Abnormal posture: Secondary | ICD-10-CM | POA: Diagnosis not present

## 2019-07-15 NOTE — Patient Instructions (Signed)
Access Code: [email or web address]: [email or web address]: 04/19/2021Prepared by: Lolah Coghlan HoltExercises  Supine Shoulder Flexion with Dowel - 1 x daily - 7 x weekly - 1 sets - 5 reps - 10 hold  Supine Shoulder Flexion with Free Weight - 1 x daily - 7 x weekly - 10 reps - 3 sets  Standing Shoulder Internal Rotation Stretch with Hands Behind Back - 2 x daily - 7 x weekly - 3 reps - 1 sets - 10 sec hold  Shoulder External Rotation with Anchored Resistance with Towel Under Elbow - 1 x daily - 7 x weekly - 2 sets - 10 reps  Shoulder Internal Rotation with Resistance - 1 x daily - 7 x weekly - 2 sets - 10 reps  Standing Row with Anchored Resistance - 1 x daily - 7 x weekly - 2 sets - 10 reps  Seated Single Arm Shoulder Scaption with Dumbbell - 1 x daily - 7 x weekly - 1 sets - 10 reps  Shoulder Internal Rotation with Resistance - 1 x daily - 7 x weekly - 1 sets - 10 reps - 3 sec hold  Scaption with Dumbbells - 1 x daily - 7 x weekly - 1-2 sets - 10 reps - 3 sec hold  Seated Shoulder Flexion - 1 x daily - 7 x weekly - 1-2 sets - 10 reps - 3 sec hold  Bent Over Single Arm Shoulder Row with Dumbbell - 1 x daily - 7 x weekly - 1-2 sets - 10 reps - 3 sec hold  Standing Plank on Wall - 1 x daily - 7 x weekly - 1 sets - 3 reps - 30 sec hold  Wall Push Up - 1 x daily - 7 x weekly - 1 sets - 10 reps - 3 sec hold

## 2019-07-15 NOTE — Therapy (Signed)
[place], [place], [address] Phone: [phone]   Fax:  [phone]  Physical Therapy Treatment  Patient Details  Name: Billy Landry MRN: [id number] Date of Birth: [birth year] Referring Provider (PT): Dr Ophelia Charter    Encounter Date: 07/15/2019  PT End of Session - 07/15/19 1018    Visit Number  16    Number of Visits  24    Date for PT Re-Evaluation  08/05/19    PT Start Time  1016    PT Stop Time  1106    PT Time Calculation (min)  50 min    Activity Tolerance  Patient tolerated treatment well;No increased pain       Past Medical History:  Diagnosis Date  . Allergy   . Arthritis    hands, hips  . BPH associated with nocturia   . DJD of right shoulder   . Essential hypertension 01/05/2015  . Hypertension   . Shingles 12/2017    Past Surgical History:  Procedure Laterality Date  . CATARACT EXTRACTION Left 2013  . RETINAL DETACHMENT SURGERY  2012  . REVERSE SHOULDER ARTHROPLASTY Right 04/24/2019   Procedure: RIGHT REVERSE SHOULDER ARTHROPLASTY;  Surgeon: Hiram Gash, MD;  Location: [facility];  Service: Orthopedics;  Laterality: Right;  . ROTATOR CUFF REPAIR Right 2010  . TONSILECTOMY, ADENOIDECTOMY, BILATERAL MYRINGOTOMY AND TUBES  1968  . VASECTOMY      There were no vitals filed for this visit.  Subjective Assessment - 07/15/19 1018    Subjective  [birth year]!  Patient reports that his shoulder is doing well. Planning to travel for ~ 3-4 weeks the first of May. Will be out of town for the entire time.    Currently in Pain?  No/denies                       [facility] Adult PT Treatment/Exercise - 07/15/19 0001      Shoulder Exercises: Seated   Other Seated Exercises  scapular depression 5 sec hold x 10 reps       Shoulder Exercises: Standing   External Rotation  Strengthening;Both;10 reps    Theraband Level (Shoulder External Rotation)   Level 2 (Red)    Flexion  Weights;Strengthening;Both;15 reps    Shoulder Flexion Weight (lbs)  2    ABduction  Strengthening;Both;15 reps;Weights   in scapular plane    Shoulder ABduction Weight (lbs)  2    Extension  Strengthening;Both;Theraband;10 reps    Theraband Level (Shoulder Extension)  Level 2 (Red)    Row  Strengthening;Right;15 reps;Weights   2 sets - one with red; one with green    Theraband Level (Shoulder Row)  Level 2 (Red);Level 3 (Green)    Row Weight (lbs)  2    Row Limitations  bent row with wt; standing for TB     Other Standing Exercises  wal plank 30 sec x 2; wall push up x 10     Other Standing Exercises  scap squeeze x 10; L's and W's x 3 sec hold x 10 reps each with noodle       Shoulder Exercises: Pulleys   Flexion  --   10 sec hold x 10 reps    Scaption  --   10 sec hold x 10 reps     Vasopneumatic   Number Minutes Vasopneumatic   10 minutes    Vasopnuematic Location   Shoulder  Rt   Vasopneumatic Pressure  Low    Vasopneumatic Temperature   34 deg      Manual Therapy   Manual therapy comments  pt supine     Soft tissue mobilization  deep tissue work Rt upper trap and rhomboid; pecs; teres/lats; biceps/anterior deltoid to decrease fascial restrictions and pain.     Myofascial Release  Rt anterior chest/pecs     Passive ROM  Rt shoulder flexion; scaptioin; ER in neutral; IR to tissue extensibility - no pain; extension              PT Education - 07/15/19 1050    Education Details  HEP    Person(s) Educated  Patient    Methods  Explanation;Demonstration;Tactile cues;Verbal cues;Handout    Comprehension  Verbalized understanding;Returned demonstration;Verbal cues required;Tactile cues required       PT Short Term Goals - 06/10/19 1125      PT SHORT TERM GOAL #1   Title  Improve posture and alignment with patient to demonstrate upright posture with posterior shoulder girdle engaged    Time  6    Period  Weeks    Status  Achieved     Target Date  06/24/19      PT SHORT TERM GOAL #2   Title  Increase PROM to 120 deg flexion and scaption; 30+ degrees ER in neutral - progressing per protocol    Time  6    Period  Weeks    Status  Achieved    Target Date  06/24/19      PT SHORT TERM GOAL #3   Title  independent in initial HEP    Time  6    Period  Weeks    Status  Achieved    Target Date  06/24/19      PT SHORT TERM GOAL #4   Title  pain free at rest with no more that 2-3/10 pain following exercise    Time  6    Period  Weeks    Status  Achieved    Target Date  06/24/19        PT Long Term Goals - 07/01/19 1106      PT LONG TERM GOAL #1   Title  Independent in advanced HEP    Time  12    Period  Weeks    Status  On-going      PT LONG TERM GOAL #2   Title  Functional use and independent in all ADL's    Time  12    Period  Weeks    Status  Achieved      PT LONG TERM GOAL #3   Title  AROM Rt shoulder WFL's and per protocol    Time  12    Period  Weeks    Status  On-going      PT LONG TERM GOAL #4   Title  4/5 to 4+/5 strength Rt shoulder    Time  12    Period  Weeks    Status  On-going      PT LONG TERM GOAL #5   Title  Plan to return to work - lifting and carrying objects weighing 25-35 pounds    Time  12    Period  Weeks    Status  On-going            Plan - 07/15/19 1041    Clinical Impression Statement  Progressing well with post op rehab with good  gains in ROM and tolerance of strengthening exercises. Added vaso post treatment due to increased soreness after new exercises today. Working well toward stated goals of therapy.    Rehab Potential  Excellent    PT Frequency  2x / week    PT Duration  12 weeks    PT Treatment/Interventions  ADLs/Self Care Home Management;Cryotherapy;Electrical Stimulation;Iontophoresis 4mg /ml Dexamethasone;Moist Heat;Ultrasound;Therapeutic activities;Therapeutic exercise;Neuromuscular re-education;Patient/family education;Manual techniques;Passive  range of motion;Dry needling;Taping    PT Next Visit Plan  progress Rt shoulder ROM and strengthening per rehab protocol; note to MD at next visit    PT Home Exercise Plan  [email or web address]    Consulted and Agree with Plan of Care  Patient       Patient will benefit from skilled therapeutic intervention in order to improve the following deficits and impairments:     Visit Diagnosis: Stiffness of right shoulder, not elsewhere classified  Abnormal posture  Muscle weakness (generalized)  Localized edema     Problem List Patient Active Problem List   Diagnosis Date Noted  . Rotator cuff tear arthropathy, right 02/13/2019  . 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 Skin problem 02/17/2016  . Macrocytosis without anemia 02/17/2016  . History of skin cancer 10/26/2015  . Essential hypertension 01/05/2015    Jasten Guyette Nilda Simmer PT, MPH  07/15/2019, 12:41 PM  [facility] [place], [place], [address] Phone: [phone]   Fax:  [phone]  Name: Billy Landry MRN: [id number] Date of Birth: 28-May-1950

## 2019-07-17 ENCOUNTER — Ambulatory Visit (INDEPENDENT_AMBULATORY_CARE_PROVIDER_SITE_OTHER): Payer: Medicare Other | Admitting: Rehabilitative and Restorative Service Providers"

## 2019-07-17 ENCOUNTER — Other Ambulatory Visit: Payer: Self-pay

## 2019-07-17 ENCOUNTER — Encounter: Payer: Self-pay | Admitting: Rehabilitative and Restorative Service Providers"

## 2019-07-17 DIAGNOSIS — R293 Abnormal posture: Secondary | ICD-10-CM

## 2019-07-17 DIAGNOSIS — M25611 Stiffness of right shoulder, not elsewhere classified: Secondary | ICD-10-CM

## 2019-07-17 DIAGNOSIS — M6281 Muscle weakness (generalized): Secondary | ICD-10-CM | POA: Diagnosis not present

## 2019-07-17 NOTE — Therapy (Addendum)
[place], [place], [address] Phone: [phone]   Fax:  [phone]  Physical Therapy Treatment  Patient Details  Name: Billy Landry MRN: [id number] Date of Birth: 1951/02/03 Referring Provider (PT): Dr Ophelia Charter    Encounter Date: 07/17/2019  PT End of Session - 07/17/19 1057    Visit Number  17    Number of Visits  24    Date for PT Re-Evaluation  08/05/19    Authorization - Visit Number  14    Authorization - Number of Visits  24    PT Start Time  4734    PT Stop Time  0370    PT Time Calculation (min)  48 min    Activity Tolerance  Patient tolerated treatment well       Past Medical History:  Diagnosis Date  . Allergy   . Arthritis    hands, hips  . BPH associated with nocturia   . DJD of right shoulder   . Essential hypertension 01/05/2015  . Hypertension   . Shingles 12/2017    Past Surgical History:  Procedure Laterality Date  . CATARACT EXTRACTION Left 2013  . RETINAL DETACHMENT SURGERY  2012  . REVERSE SHOULDER ARTHROPLASTY Right 04/24/2019   Procedure: RIGHT REVERSE SHOULDER ARTHROPLASTY;  Surgeon: Hiram Gash, MD;  Location: [facility];  Service: Orthopedics;  Laterality: Right;  . ROTATOR CUFF REPAIR Right 2010  . TONSILECTOMY, ADENOIDECTOMY, BILATERAL MYRINGOTOMY AND TUBES  1968  . VASECTOMY      There were no vitals filed for this visit.  Subjective Assessment - 07/17/19 1100    Subjective  Did okay with the new strengthening exercises. Minimal soreness - tightness in the front of the shoulder.    Currently in Pain?  No/denies         [facility] PT Assessment - 07/17/19 0001      Assessment   Medical Diagnosis  Rt reverse total shoulder     Referring Provider (PT)  Dr Ophelia Charter     Onset Date/Surgical Date  04/24/19    Hand Dominance  Right    Next MD Visit  07/18/19    Prior Therapy  after RCR       AROM   Right Shoulder Extension  52  Degrees    Right Shoulder Flexion  141 Degrees    Right Shoulder ABduction  133 Degrees    Right Shoulder Internal Rotation  --   Thumb to T12   Right Shoulder External Rotation  70 Degrees   shoulder abducted at 80 deg      PROM   Right/Left Shoulder  --   PROM to tissue limits without pain    Right Shoulder Extension  53 Degrees    Right Shoulder Flexion  152 Degrees    Right Shoulder ABduction  163 Degrees   in scapular plane    Right Shoulder Internal Rotation  48 Degrees   in scapular plane    Right Shoulder External Rotation  85 Degrees   in scapular plane      Strength   Overall Strength Comments  increasing strength Rt UE - using red and green TB and 2# weights for resistive exercises       Palpation   Palpation comment  improving muscular tightness Lt upper quarter through pecs; upper trap; leveator; periscapular musculature  [facility] Adult PT Treatment/Exercise - 07/17/19 0001      Shoulder Exercises: Prone   Other Prone Exercises  prone progression scapuar retractioin with UE's at side; UE's in row position; UE's in T; UE's in V 10 sec hold x 5 reps each       Shoulder Exercises: Standing   External Rotation  Strengthening;Both;10 reps    Theraband Level (Shoulder External Rotation)  Level 2 (Red)    Flexion  Weights;Strengthening;Both;15 reps    Shoulder Flexion Weight (lbs)  2    ABduction  Strengthening;Both;15 reps;Weights   in scapular plane    Shoulder ABduction Weight (lbs)  2    Extension  Strengthening;Both;Theraband;10 reps    Theraband Level (Shoulder Extension)  Level 3 (Green)    Row  Strengthening;Right;15 reps;Weights   2 sets - one with red; one with green    Theraband Level (Shoulder Row)  Level 3 (Green)    Row Weight (lbs)  2    Row Limitations  bent row with wt; standing for TB     Other Standing Exercises  wall plank 30 sec x 2; wall push up x 10     Other Standing Exercises  scap squeeze x 10; L's and W's x 3  sec hold x 10 reps each with noodle       Shoulder Exercises: Therapy Ball   Scaption Limitations  ball on wall at dorsum of hand working on small circles with wall to increase ER strength - shoulder ~ 45 deg/elbow 90 deg flexion       Shoulder Exercises: ROM/Strengthening   UBE (Upper Arm Bike)  L2 x 4 min alternating fwd/back              PT Education - 07/17/19 1123    Education Details  HEP    Person(s) Educated  Patient    Methods  Explanation;Demonstration;Tactile cues;Verbal cues;Handout    Comprehension  Verbalized understanding;Returned demonstration;Verbal cues required;Tactile cues required       PT Short Term Goals - 06/10/19 1125      PT SHORT TERM GOAL #1   Title  Improve posture and alignment with patient to demonstrate upright posture with posterior shoulder girdle engaged    Time  6    Period  Weeks    Status  Achieved    Target Date  06/24/19      PT SHORT TERM GOAL #2   Title  Increase PROM to 120 deg flexion and scaption; 30+ degrees ER in neutral - progressing per protocol    Time  6    Period  Weeks    Status  Achieved    Target Date  06/24/19      PT SHORT TERM GOAL #3   Title  independent in initial HEP    Time  6    Period  Weeks    Status  Achieved    Target Date  06/24/19      PT SHORT TERM GOAL #4   Title  pain free at rest with no more that 2-3/10 pain following exercise    Time  6    Period  Weeks    Status  Achieved    Target Date  06/24/19        PT Long Term Goals - 07/01/19 1106      PT LONG TERM GOAL #1   Title  Independent in advanced HEP    Time  12    Period  Weeks  Status  On-going      PT LONG TERM GOAL #2   Title  Functional use and independent in all ADL's    Time  12    Period  Weeks    Status  Achieved      PT LONG TERM GOAL #3   Title  AROM Rt shoulder WFL's and per protocol    Time  12    Period  Weeks    Status  On-going      PT LONG TERM GOAL #4   Title  4/5 to 4+/5 strength Rt shoulder     Time  12    Period  Weeks    Status  On-going      PT LONG TERM GOAL #5   Title  Plan to return to work - lifting and carrying objects weighing 25-35 pounds    Time  12    Period  Weeks    Status  On-going            Plan - 07/17/19 1107    Clinical Impression Statement  Excellent progress with AROM; PROM; strengthening exercises; functional activities. Patient is working on [employer] independently. Progressing well toward stated goals of therapy.    Rehab Potential  Excellent    PT Frequency  2x / week    PT Duration  12 weeks    PT Treatment/Interventions  ADLs/Self Care Home Management;Cryotherapy;Electrical Stimulation;Iontophoresis 25m/ml Dexamethasone;Moist Heat;Ultrasound;Therapeutic activities;Therapeutic exercise;Neuromuscular re-education;Patient/family education;Manual techniques;Passive range of motion;Dry needling;Taping    PT Next Visit Plan  progress Rt shoulder ROM and strengthening per rehab protocol; note to MD    PT Home Exercise Plan  [email or web address]       Patient will benefit from skilled therapeutic intervention in order to improve the following deficits and impairments:     Visit Diagnosis: Stiffness of right shoulder, not elsewhere classified  Abnormal posture  Muscle weakness (generalized)     Problem List Patient Active Problem List   Diagnosis Date Noted  . Rotator cuff tear arthropathy, right 02/13/2019  . 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 Skin problem 02/17/2016  . Macrocytosis without anemia 02/17/2016  . History of skin cancer 10/26/2015  . Essential hypertension 01/05/2015    Anjanae Woehrle PNilda Landry, Billy Landry  07/17/2019, 11:47 AM  [facility] [place] [address]Phone: [phone]  Fax:  [phone] Name: RAniel HubbleMRN: [id number] of Birth: 4Feb 05, 1952 PHYSICAL THERAPY DISCHARGE SUMMARY  Visits from Start of Care: 17  Current  functional level related to goals / functional outcomes: See progress note for discharge status - excellent progress with shoulder rehab.   Remaining deficits: Should continue with independent HEP    Education / Equipment: HEP/theraband Plan: Patient agrees to discharge.  Patient goals were met. Patient is being discharged due to meeting the stated rehab goals.  ?????     Akim Watkinson P. HHelene KelpPT, Billy Landry 08/07/19 3:44 PM '

## 2019-07-17 NOTE — Patient Instructions (Addendum)
Resisted External Rotation: in Neutral - Bilateral   PALMS UP Sit or stand, tubing in both hands, elbows at sides, bent to 90, forearms forward. Pinch shoulder blades together and rotate forearms out. Keep elbows at sides. Repeat __10__ times per set. Do _2-3___ sets per session. Do _2-3___ sessions per day.   Low Row: Standing   Face anchor, feet shoulder width apart. Palms up, pull arms back, squeezing shoulder blades together. Repeat 10__ times per set. Do 2-3__ sets per session. Do 2-3__ sessions per week. Anchor Height: Waist  Bow and arrow - as above - stepping back with one foot    Strengthening: Resisted Extension   Hold tubing in right hand, arm forward. Pull arm back, elbow straight. Repeat _10___ times per set. Do 2-3____ sets per session. Do 2-3____ sessions per day.   Ball on Wall: [employer] ball in circles. Do this actively with both hands on ball. Roll ___1-2 min Arm Press: on Wall   You will be 45 deg to wall - not back flat against wall  Knees bent, spine pressed into long flat up roller, forearm and hand against ball, palm forward, slide arm up. Then press arm into support. Keep knees bent. Hold __20-40_ seconds. Repeat _3-5__ times. Right arm on ball. Do _1__ sessions per day.     Lying on stomach - forehead resting on towel  Arms at side - squeeze shoulder blades down and back toward back pockets then lift head and chest  Hold 5-10 sec 5-10 reps   Arms in T - repeat as above   Arms in V - repeat as above     .Access Code: [email or web address]: [email or web address]: 04/21/2021Prepared by: Habiba Treloar HoltExercises  Supine Shoulder Flexion with Dowel - 1 x daily - 7 x weekly - 1 sets - 5 reps - 10 hold  Supine Shoulder Flexion with Free Weight - 1 x daily - 7 x weekly - 10 reps - 3 sets  Standing Shoulder Internal Rotation Stretch with Hands Behind Back - 2 x daily - 7 x weekly - 3 reps - 1 sets - 10 sec hold  Shoulder  External Rotation with Anchored Resistance with Towel Under Elbow - 1 x daily - 7 x weekly - 2 sets - 10 reps  Shoulder Internal Rotation with Resistance - 1 x daily - 7 x weekly - 2 sets - 10 reps  Standing Row with Anchored Resistance - 1 x daily - 7 x weekly - 2 sets - 10 reps  Seated Single Arm Shoulder Scaption with Dumbbell - 1 x daily - 7 x weekly - 1 sets - 10 reps  Shoulder Internal Rotation with Resistance - 1 x daily - 7 x weekly - 1 sets - 10 reps - 3 sec hold  Scaption with Dumbbells - 1 x daily - 7 x weekly - 1-2 sets - 10 reps - 3 sec hold  Seated Shoulder Flexion - 1 x daily - 7 x weekly - 1-2 sets - 10 reps - 3 sec hold  Bent Over Single Arm Shoulder Row with Dumbbell - 1 x daily - 7 x weekly - 1-2 sets - 10 reps - 3 sec hold  Standing Plank on Wall - 1 x daily - 7 x weekly - 1 sets - 3 reps - 30 sec hold  Wall Push Up - 1 x daily - 7 x weekly - 1 sets - 10 reps - 3 sec hold  Sidelying Mid Thoracic  Rotation - 1 x daily - 7 x weekly - 1-2 sets - 10 reps - hold

## 2019-07-18 DIAGNOSIS — M19011 Primary osteoarthritis, right shoulder: Secondary | ICD-10-CM | POA: Diagnosis not present

## 2019-08-08 ENCOUNTER — Other Ambulatory Visit: Payer: Self-pay

## 2019-08-08 MED ORDER — GABAPENTIN 100 MG PO CAPS: 100.0000 mg | ORAL_CAPSULE | Freq: Every day | ORAL | 1 refills | Status: DC

## 2019-08-21 ENCOUNTER — Ambulatory Visit (INDEPENDENT_AMBULATORY_CARE_PROVIDER_SITE_OTHER): Payer: Medicare Other | Admitting: Osteopathic Medicine

## 2019-08-21 ENCOUNTER — Encounter: Payer: Self-pay | Admitting: Osteopathic Medicine

## 2019-08-21 VITALS — BP 148/91 | HR 66 | Temp 98.3°F | Wt 179.1 lb

## 2019-08-21 DIAGNOSIS — L03116 Cellulitis of left lower limb: Secondary | ICD-10-CM | POA: Diagnosis not present

## 2019-08-21 DIAGNOSIS — Z8616 Personal history of COVID-19: Secondary | ICD-10-CM | POA: Diagnosis not present

## 2019-08-21 MED ORDER — DOXYCYCLINE HYCLATE 100 MG PO TABS: 100.0000 mg | ORAL_TABLET | Freq: Two times a day (BID) | ORAL | 0 refills | Status: DC

## 2019-08-21 MED ORDER — NAPROXEN 500 MG PO TABS: 500.0000 mg | ORAL_TABLET | Freq: Two times a day (BID) | ORAL | 3 refills | Status: DC

## 2019-08-21 NOTE — Progress Notes (Signed)
Billy Landry is a 69 y.o. male who presents to  [place] at [facility]  today, 08/21/19, seeking care for the following:   Leg swelling / redness -ongoing a couple weeks, tender to the touch, few spots that are almost blistering, had some skin sensitivity prior to development of rash  BP follow-up          ASSESSMENT & PLAN with other pertinent history/findings:  The primary encounter diagnosis was Cellulitis of left lower extremity. A diagnosis of History of COVID-19 was also pertinent to this visit.   Cellulitis primary differential but honestly I think this could easily be shingles, since it has been ongoing for some time he is unlikely to benefit from antivirals, particularly since his symptoms are not severe.  Possibly vasculitis, PAD seems unlikely.  I think on the safe side, let us treat for cellulitis with antibiotics, will trial NSAIDs and also advised warm compresses, if not better would probably return for biopsy/further evaluation pending clinical picture at that time  Patient quested antibody levels for COVID-19, previous infection.  Blood pressure above goal today, patient has blood pressure monitor at home, advised to check at home, keep less than 140/90, ideally around 130/80, or lower as long as no orthostatic symptoms  BP Readings from Last 3 Encounters:  08/21/19 (!) 148/91  05/20/19 (!) 143/84  05/15/19 135/72      There are no Patient Instructions on file for this visit.   Orders Placed This Encounter  Procedures  . SAR CoV2 Serology (COVID 19)AB(IGG)IA    Meds ordered this encounter  Medications  . doxycycline (VIBRA-TABS) 100 MG tablet    Sig: Take 1 tablet (100 mg total) by mouth 2 (two) times daily.    Dispense:  14 tablet    Refill:  0  . naproxen (NAPROSYN) 500 MG tablet    Sig: Take 1 tablet (500 mg total) by mouth 2 (two) times daily with a meal. For one week, then as needed for aches/pains  after that    Dispense:  60 tablet    Refill:  3       Follow-up instructions: Return if symptoms worsen or fail to improve.                                         BP (!) 148/91 (BP Location: Left Arm, Patient Position: Sitting, Cuff Size: Normal)   Pulse 66   Temp 98.3 F (36.8 C) (Oral)   Wt 179 lb 1.9 oz (81.2 kg)   BMI 25.70 kg/m   Current Meds  Medication Sig  . acetaminophen (TYLENOL) 500 MG tablet Take 500 mg by mouth every 6 (six) hours as needed.  . Ascorbic Acid (VITAMIN C PO) Take by mouth.  . Cholecalciferol (VITAMIN D-1000 MAX ST) 1000 units tablet Take by mouth.  . Cinnamon 500 MG capsule Take by mouth.  . doxazosin (CARDURA) 2 MG tablet Take 2 mg by mouth daily.  Marland Kitchen gabapentin (NEURONTIN) 100 MG capsule Take 1-3 capsules (100-300 mg total) by mouth at bedtime.  . hydrochlorothiazide (HYDRODIURIL) 12.5 MG tablet Take 1 tablet (12.5 mg total) by mouth daily. (Patient taking differently: Take 25 mg by mouth daily. )  . L-Lysine 1000 MG TABS Take by mouth.  . loratadine (CLARITIN) 10 MG tablet Take 10 mg by mouth daily.  Marland Kitchen losartan (COZAAR) 100 MG  tablet TAKE ONE-HALF TABLET BY MOUTH EVERY DAY  . Melatonin 5 MG TABS Take by mouth.  . valACYclovir (VALTREX) 1000 MG tablet TAKE 1/2 TABLET BY MOUTH DAILY AS NEEDED  . vitamin E 400 UNIT capsule Take 400 Units by mouth 2 (two) times daily.     No results found for this or any previous visit (from the past 72 hour(s)).  No results found.  Depression screen [facility] 2/9 05/20/2019 01/30/2019 01/29/2018  Decreased Interest 0 0 0  Down, Depressed, Hopeless 0 0 0  PHQ - 2 Score 0 0 0    GAD 7 : Generalized Anxiety Score 01/30/2019  Nervous, Anxious, on Edge 0  Control/stop worrying 0  Worry too much - different things 0  Trouble relaxing 0  Restless 0  Easily annoyed or irritable 0  Afraid - awful might happen 0  Total GAD 7 Score 0      All questions at time of visit were  answered - patient instructed to contact office with any additional concerns or updates.  ER/RTC precautions were reviewed with the patient.  Please note: voice recognition software was used to produce this document, and typos may escape review. Please contact Dr. Sheppard Coil for any needed clarifications.

## 2019-08-22 LAB — SAR COV2 SEROLOGY (COVID19)AB(IGG),IA: DiaSorin SARS-CoV-2 Ab, IgG: NEGATIVE

## 2019-08-29 ENCOUNTER — Telehealth: Payer: Self-pay

## 2019-08-29 NOTE — Telephone Encounter (Signed)
Patient called into office this am  seen 08/21/19, for swelling in left ankle. Patient has finished Doxycycline, left ankle is still swollen. Please advise

## 2019-08-30 ENCOUNTER — Encounter: Payer: Self-pay | Admitting: Osteopathic Medicine

## 2019-08-30 MED ORDER — BETAMETHASONE DIPROPIONATE 0.05 % EX CREA: TOPICAL_CREAM | Freq: Two times a day (BID) | CUTANEOUS | 1 refills | Status: DC

## 2019-08-30 NOTE — Telephone Encounter (Signed)
Patient advised. States the two locations are not as inflamed and some places seemed to have dried up but still has some ankle edema.

## 2019-08-30 NOTE — Telephone Encounter (Signed)
We had discussed at his visit that this may be an unusual presentation of shingles, in which case I would not expect the antibiotics to work.  I have sent in a prescription for topical steroids to see if we can get this to calm down, but if that does not help I would probably want a second opinion from dermatology or he could come into the office and we could do a biopsy here.

## 2019-09-19 ENCOUNTER — Encounter: Payer: Self-pay | Admitting: Osteopathic Medicine

## 2019-10-12 ENCOUNTER — Encounter: Payer: Self-pay | Admitting: Osteopathic Medicine

## 2019-10-23 ENCOUNTER — Ambulatory Visit: Payer: Medicare Other

## 2019-10-24 DIAGNOSIS — H43811 Vitreous degeneration, right eye: Secondary | ICD-10-CM | POA: Diagnosis not present

## 2019-10-24 DIAGNOSIS — H31091 Other chorioretinal scars, right eye: Secondary | ICD-10-CM | POA: Diagnosis not present

## 2019-10-25 ENCOUNTER — Ambulatory Visit (INDEPENDENT_AMBULATORY_CARE_PROVIDER_SITE_OTHER): Payer: Medicare Other | Admitting: Osteopathic Medicine

## 2019-10-25 ENCOUNTER — Other Ambulatory Visit: Payer: Self-pay

## 2019-10-25 ENCOUNTER — Encounter: Payer: Self-pay | Admitting: Osteopathic Medicine

## 2019-10-25 VITALS — BP 170/92 | HR 60 | Temp 97.8°F | Wt 181.1 lb

## 2019-10-25 DIAGNOSIS — I10 Essential (primary) hypertension: Secondary | ICD-10-CM | POA: Diagnosis not present

## 2019-10-25 DIAGNOSIS — S161XXA Strain of muscle, fascia and tendon at neck level, initial encounter: Secondary | ICD-10-CM | POA: Diagnosis not present

## 2019-10-25 DIAGNOSIS — Z1211 Encounter for screening for malignant neoplasm of colon: Secondary | ICD-10-CM

## 2019-10-25 MED ORDER — DOXAZOSIN MESYLATE 2 MG PO TABS: 2.0000 mg | ORAL_TABLET | Freq: Every day | ORAL | 1 refills | Status: DC

## 2019-10-25 NOTE — Patient Instructions (Addendum)
See printed exercises for neck pain  Will restart Cardura  Will message I n1 week to reply w/ BP readings

## 2019-10-25 NOTE — Progress Notes (Signed)
Billy Landry is a 69 y.o. male who presents to  [place] at [facility]  today, 10/25/19, seeking care for the following:  . Blood pressure f/u - white coat HTN. Home BP a little above goal  . Pt reports not taking HCTZ or Cardura  . Taking Losartan 100 mg daily  . Neck soreness, no arm/hand numbness or weakness     ASSESSMENT & PLAN with other pertinent findings:  The primary encounter diagnosis was Essential hypertension. A diagnosis of Strain of neck muscle, initial encounter was also pertinent to this visit.   No results found for this or any previous visit (from the past 24 hour(s)).     Patient Instructions  See printed exercises for neck pain  Will restart Cardura  Will message I n1 week to reply w/ BP readings    No orders of the defined types were placed in this encounter.   Meds ordered this encounter  Medications  . doxazosin (CARDURA) 2 MG tablet    Sig: Take 1 tablet (2 mg total) by mouth daily.    Dispense:  90 tablet    Refill:  1       Follow-up instructions: Return in about 4 months (around 02/25/2020) for ANNUAL (call week prior to visit for lab orders).                                         BP (!) 170/92 (BP Location: Left Arm, Patient Position: Sitting, Cuff Size: Normal)   Pulse 60   Temp 97.8 F (36.6 C) (Oral)   Wt 181 lb 1.3 oz (82.1 kg)   BMI 25.98 kg/m   Current Meds  Medication Sig  . Ascorbic Acid (VITAMIN C PO) Take by mouth.  . Cholecalciferol (VITAMIN D-1000 MAX ST) 1000 units tablet Take by mouth.  . Cinnamon 500 MG capsule Take by mouth.  . gabapentin (NEURONTIN) 100 MG capsule Take 1-3 capsules (100-300 mg total) by mouth at bedtime.  Marland Kitchen L-Lysine 1000 MG TABS Take by mouth.  . loratadine (CLARITIN) 10 MG tablet Take 10 mg by mouth daily.  Marland Kitchen losartan (COZAAR) 100 MG tablet TAKE ONE-HALF TABLET BY MOUTH EVERY DAY  . valACYclovir (VALTREX)  1000 MG tablet TAKE 1/2 TABLET BY MOUTH DAILY AS NEEDED  . vitamin E 400 UNIT capsule Take 400 Units by mouth 2 (two) times daily.     No results found for this or any previous visit (from the past 72 hour(s)).  No results found.     All questions at time of visit were answered - patient instructed to contact office with any additional concerns or updates.  ER/RTC precautions were reviewed with the patient as applicable.   Please note: voice recognition software was used to produce this document, and typos may escape review. Please contact Dr. Sheppard Coil for any needed clarifications.

## 2019-11-07 ENCOUNTER — Other Ambulatory Visit: Payer: Self-pay

## 2019-11-07 DIAGNOSIS — B001 Herpesviral vesicular dermatitis: Secondary | ICD-10-CM

## 2019-11-07 MED ORDER — VALACYCLOVIR HCL 1 G PO TABS: ORAL_TABLET | ORAL | 1 refills | Status: DC

## 2019-11-13 ENCOUNTER — Encounter: Payer: Self-pay | Admitting: Osteopathic Medicine

## 2019-11-15 MED ORDER — MELOXICAM 7.5 MG PO TABS: 7.5000 mg | ORAL_TABLET | Freq: Two times a day (BID) | ORAL | 2 refills | Status: DC

## 2019-11-15 MED ORDER — GABAPENTIN 100 MG PO CAPS: 100.0000 mg | ORAL_CAPSULE | Freq: Every day | ORAL | 3 refills | Status: DC

## 2019-11-22 DIAGNOSIS — H43811 Vitreous degeneration, right eye: Secondary | ICD-10-CM | POA: Diagnosis not present

## 2019-11-22 DIAGNOSIS — H33012 Retinal detachment with single break, left eye: Secondary | ICD-10-CM | POA: Diagnosis not present

## 2019-12-29 ENCOUNTER — Encounter: Payer: Self-pay | Admitting: Osteopathic Medicine

## 2020-01-03 NOTE — Addendum Note (Signed)
Addended by: Maryla Morrow on: 01/03/2020 04:00 PM   Modules accepted: Orders

## 2020-01-10 DIAGNOSIS — L570 Actinic keratosis: Secondary | ICD-10-CM | POA: Diagnosis not present

## 2020-01-10 DIAGNOSIS — L821 Other seborrheic keratosis: Secondary | ICD-10-CM | POA: Diagnosis not present

## 2020-01-10 DIAGNOSIS — Z85828 Personal history of other malignant neoplasm of skin: Secondary | ICD-10-CM | POA: Diagnosis not present

## 2020-01-10 DIAGNOSIS — L923 Foreign body granuloma of the skin and subcutaneous tissue: Secondary | ICD-10-CM | POA: Diagnosis not present

## 2020-01-10 DIAGNOSIS — L814 Other melanin hyperpigmentation: Secondary | ICD-10-CM | POA: Diagnosis not present

## 2020-01-10 DIAGNOSIS — D229 Melanocytic nevi, unspecified: Secondary | ICD-10-CM | POA: Diagnosis not present

## 2020-01-31 ENCOUNTER — Encounter: Payer: Self-pay | Admitting: Osteopathic Medicine

## 2020-01-31 DIAGNOSIS — H35411 Lattice degeneration of retina, right eye: Secondary | ICD-10-CM | POA: Diagnosis not present

## 2020-01-31 DIAGNOSIS — H43811 Vitreous degeneration, right eye: Secondary | ICD-10-CM | POA: Diagnosis not present

## 2020-01-31 DIAGNOSIS — H31093 Other chorioretinal scars, bilateral: Secondary | ICD-10-CM | POA: Diagnosis not present

## 2020-02-14 DIAGNOSIS — H2511 Age-related nuclear cataract, right eye: Secondary | ICD-10-CM | POA: Diagnosis not present

## 2020-02-14 DIAGNOSIS — H33002 Unspecified retinal detachment with retinal break, left eye: Secondary | ICD-10-CM | POA: Diagnosis not present

## 2020-02-14 DIAGNOSIS — H04123 Dry eye syndrome of bilateral lacrimal glands: Secondary | ICD-10-CM | POA: Diagnosis not present

## 2020-02-14 DIAGNOSIS — H43393 Other vitreous opacities, bilateral: Secondary | ICD-10-CM | POA: Diagnosis not present

## 2020-02-14 DIAGNOSIS — H5203 Hypermetropia, bilateral: Secondary | ICD-10-CM | POA: Diagnosis not present

## 2020-02-14 DIAGNOSIS — Z961 Presence of intraocular lens: Secondary | ICD-10-CM | POA: Diagnosis not present

## 2020-02-14 DIAGNOSIS — H02839 Dermatochalasis of unspecified eye, unspecified eyelid: Secondary | ICD-10-CM | POA: Diagnosis not present

## 2020-02-27 DIAGNOSIS — N400 Enlarged prostate without lower urinary tract symptoms: Secondary | ICD-10-CM | POA: Diagnosis not present

## 2020-02-27 DIAGNOSIS — N401 Enlarged prostate with lower urinary tract symptoms: Secondary | ICD-10-CM | POA: Diagnosis not present

## 2020-02-27 LAB — PSA: PSA: 1.15

## 2020-03-09 ENCOUNTER — Encounter: Payer: Self-pay | Admitting: Osteopathic Medicine

## 2020-04-01 ENCOUNTER — Other Ambulatory Visit: Payer: Self-pay

## 2020-04-01 DIAGNOSIS — B001 Herpesviral vesicular dermatitis: Secondary | ICD-10-CM

## 2020-04-01 MED ORDER — VALACYCLOVIR HCL 1 G PO TABS: ORAL_TABLET | ORAL | 1 refills | Status: DC

## 2020-04-21 ENCOUNTER — Encounter: Payer: Self-pay | Admitting: Osteopathic Medicine

## 2020-04-22 ENCOUNTER — Other Ambulatory Visit: Payer: Self-pay

## 2020-04-22 ENCOUNTER — Ambulatory Visit (INDEPENDENT_AMBULATORY_CARE_PROVIDER_SITE_OTHER): Payer: Medicare Other | Admitting: Osteopathic Medicine

## 2020-04-22 ENCOUNTER — Encounter: Payer: Self-pay | Admitting: Osteopathic Medicine

## 2020-04-22 VITALS — BP 135/68 | HR 59 | Wt 180.8 lb

## 2020-04-22 DIAGNOSIS — R42 Dizziness and giddiness: Secondary | ICD-10-CM | POA: Diagnosis not present

## 2020-04-22 DIAGNOSIS — I10 Essential (primary) hypertension: Secondary | ICD-10-CM

## 2020-04-22 DIAGNOSIS — E875 Hyperkalemia: Secondary | ICD-10-CM | POA: Diagnosis not present

## 2020-04-22 DIAGNOSIS — I959 Hypotension, unspecified: Secondary | ICD-10-CM

## 2020-04-22 DIAGNOSIS — E785 Hyperlipidemia, unspecified: Secondary | ICD-10-CM | POA: Diagnosis not present

## 2020-04-22 DIAGNOSIS — D7589 Other specified diseases of blood and blood-forming organs: Secondary | ICD-10-CM

## 2020-04-22 MED ORDER — OLMESARTAN MEDOXOMIL 20 MG PO TABS: 20.0000 mg | ORAL_TABLET | Freq: Every day | ORAL | 0 refills | Status: DC

## 2020-04-22 NOTE — Patient Instructions (Addendum)
Plan:  STOP losartan  START olmesartan 20 mg at night TOMORROW  (OK if BP bumps up a little bit tomorrow morning)   LABS today to make sure nothing else contributing to low BP   Message me in 1 week w/ BP readings (can take photo of your record sheet and send though MyChart!)   Goal BP <140/90 - I'd rather BP be borderline high than dropping too low!

## 2020-04-22 NOTE — Progress Notes (Signed)
HPI: Billy Landry is a 70 y.o. male who  has a past medical history of Allergy, Arthritis, BPH associated with nocturia, DJD of right shoulder, Essential hypertension (01/05/2015), Hypertension, and Shingles (12/2017).  he presents to [facility] today, 04/22/20,  for chief complaint of:  Dropping BP and lightheadedness when it's low -seems to be happening intermittently, typically after breakfast.  His daily routine is pretty much the same, wakes up, takes medications shortly after coffee, usually has breakfast of oatmeal and maybe some eggs or fruit.  He notes home blood pressure as low as 70's/40's and this resolves spontaneously. No syncope. No known triggers other than eating breakfast but his is not a consistent trigger.     ASSESSMENT/PLAN: The primary encounter diagnosis was Hypotension, unspecified hypotension type. Diagnoses of Lightheaded and Hyperkalemia were also pertinent to this visit.  Strange pattern for intermittent postprandial hypotension -Per up-to-date article, this tends to occur not infrequently in patients who also have orthostatic hypotension, more common in older adults.  Acarbose, octreotide, caffeine may be helpful, will also trial dietary modification as below  "Modification of meals may be helpful in selected patients [1,17]: ?Avoiding large meals ?Ingesting meals low in carbohydrates ?Avoiding alcohol intake ?Drinking water with meals ?Avoiding activities or sudden standing immediately after eating Lying semirecumbent for 90 minutes after meals may be necessary for some patients. Patients should try to walk in between meals."  See MyChart message -the above information was relayed to the patient after doing some research on up-to-date.  I still think worth a try to change blood pressure medications to something with a more steady state release as opposed to losartan, taking medications and evening may help pressure in general.   Will follow closely     Orders Placed This Encounter  Procedures  . CBC  . COMPLETE METABOLIC PANEL WITH GFR  . TSH  . Urinalysis, Routine w reflex microscopic     Meds ordered this encounter  Medications  . olmesartan (BENICAR) 20 MG tablet    Sig: Take 1 tablet (20 mg total) by mouth daily.    Dispense:  90 tablet    Refill:  0    Patient Instructions  Plan:  STOP losartan  START olmesartan 20 mg at night TOMORROW  (OK if BP bumps up a little bit tomorrow morning)   LABS today to make sure nothing else contributing to low BP   Message me in 1 week w/ BP readings (can take photo of your record sheet and send though MyChart!)   Goal BP <140/90 - I'd rather BP be borderline high than dropping too low!        Follow-up plan: Return for FOLLOW UP VIA [place] (SET TO SEND LATER).                                                 ################################################# ################################################# ################################################# #################################################    Current Meds  Medication Sig  . Cholecalciferol 25 MCG (1000 UT) tablet Take by mouth.  . Cinnamon 500 MG capsule Take by mouth.  . doxazosin (CARDURA) 2 MG tablet Take 1 tablet (2 mg total) by mouth daily.  Marland Kitchen gabapentin (NEURONTIN) 100 MG capsule Take 1-3 capsules (100-300 mg total) by mouth at bedtime.  Marland Kitchen L-Lysine 1000 MG TABS Take by mouth.  . loratadine (CLARITIN) 10  MG tablet Take 10 mg by mouth daily.  Marland Kitchen olmesartan (BENICAR) 20 MG tablet Take 1 tablet (20 mg total) by mouth daily.  . valACYclovir (VALTREX) 1000 MG tablet TAKE 1/2 TABLET BY MOUTH DAILY AS NEEDED  . vitamin E 400 UNIT capsule Take 400 Units by mouth 2 (two) times daily.   . [DISCONTINUED] losartan (COZAAR) 100 MG tablet TAKE ONE-HALF TABLET BY MOUTH EVERY DAY    Allergies  Allergen Reactions  . Bee Venom  Anaphylaxis and Swelling       Review of Systems: Pertinent (+) and (-) ROS in HPI as above   Exam:  BP 135/68   Pulse (!) 59   Wt 180 lb 12.8 oz (82 kg)   SpO2 97%   BMI 25.94 kg/m   Constitutional: VS see above. General Appearance: alert, well-developed, well-nourished, NAD  Neck: No masses, trachea midline.   Respiratory: Normal respiratory effort. no wheeze, no rhonchi, no rales  Cardiovascular: S1/S2 normal, no murmur, no rub/gallop auscultated. RRR.   Musculoskeletal: Gait normal. Symmetric and independent movement of all extremities  Abdominal: non-tender, non-distended, no appreciable organomegaly, neg Murphy's, BS WNLx4  Neurological: Normal balance/coordination. No tremor.  Skin: warm, dry, intact.   Psychiatric: Normal judgment/insight. Normal mood and affect. Oriented x3.       Visit summary with medication list and pertinent instructions was printed for patient to review, patient was advised to alert [place] if any updates are needed. All questions at time of visit were answered - patient instructed to contact office with any additional concerns. ER/RTC precautions were reviewed with the patient and understanding verbalized.    Total time spent in this in-person encounter was 40 minutes performing my duty and my job as a physician: record review, history-taking and assessment of patient, counseling patient and ensuring understanding of plan, coordinating care which may include but is not limited to further testing or referral. Any modifiers needed may be added by billing department as necessary if I have lapsed in doing so.     Please note: voice recognition software was used to produce this document, and typos may escape review. Please contact Dr. Sheppard Coil for any needed clarifications.    Follow up plan: Return for [place] (SET TO SEND LATER).

## 2020-04-23 ENCOUNTER — Encounter: Payer: Self-pay | Admitting: Osteopathic Medicine

## 2020-04-23 LAB — COMPLETE METABOLIC PANEL WITH GFR
AG Ratio: 2.4 (calc) (ref 1.0–2.5)
ALT: 36 U/L (ref 9–46)
AST: 28 U/L (ref 10–35)
Albumin: 4.3 g/dL (ref 3.6–5.1)
Alkaline phosphatase (APISO): 66 U/L (ref 35–144)
BUN/Creatinine Ratio: 27 (calc) — ABNORMAL HIGH (ref 6–22)
BUN: 28 mg/dL — ABNORMAL HIGH (ref 7–25)
CO2: 30 mmol/L (ref 20–32)
Calcium: 9.7 mg/dL (ref 8.6–10.3)
Chloride: 101 mmol/L (ref 98–110)
Creat: 1.04 mg/dL (ref 0.70–1.18)
GFR, Est African American: 84 mL/min/{1.73_m2} (ref >=60)
GFR, Est Non African American: 72 mL/min/{1.73_m2} (ref >=60)
Globulin: 1.8 g/dL (calc) — ABNORMAL LOW (ref 1.9–3.7)
Glucose, Bld: 84 mg/dL (ref 65–139)
Potassium: 5.8 mmol/L — ABNORMAL HIGH (ref 3.5–5.3)
Sodium: 135 mmol/L (ref 135–146)
Total Bilirubin: 0.7 mg/dL (ref 0.2–1.2)
Total Protein: 6.1 g/dL (ref 6.1–8.1)

## 2020-04-23 LAB — CBC
HCT: 38 % — ABNORMAL LOW (ref 38.5–50.0)
Hemoglobin: 13.4 g/dL (ref 13.2–17.1)
MCH: 37.1 pg — ABNORMAL HIGH (ref 27.0–33.0)
MCHC: 35.3 g/dL (ref 32.0–36.0)
MCV: 105.3 fL — ABNORMAL HIGH (ref 80.0–100.0)
MPV: 12.9 fL — ABNORMAL HIGH (ref 7.5–12.5)
Platelets: 124 10*3/uL — ABNORMAL LOW (ref 140–400)
RBC: 3.61 10*6/uL — ABNORMAL LOW (ref 4.20–5.80)
RDW: 12.6 % (ref 11.0–15.0)
WBC: 5.4 10*3/uL (ref 3.8–10.8)

## 2020-04-23 LAB — URINALYSIS, ROUTINE W REFLEX MICROSCOPIC
Bilirubin Urine: NEGATIVE
Glucose, UA: NEGATIVE
Hgb urine dipstick: NEGATIVE
Ketones, ur: NEGATIVE
Leukocytes,Ua: NEGATIVE
Nitrite: NEGATIVE
Protein, ur: NEGATIVE
Specific Gravity, Urine: 1.014 (ref 1.001–1.03)
pH: 5.5 (ref 5.0–8.0)

## 2020-04-23 LAB — TSH: TSH: 1.63 mIU/L (ref 0.40–4.50)

## 2020-04-27 ENCOUNTER — Encounter: Payer: Self-pay | Admitting: Osteopathic Medicine

## 2020-04-27 DIAGNOSIS — I959 Hypotension, unspecified: Secondary | ICD-10-CM

## 2020-04-28 DIAGNOSIS — M19011 Primary osteoarthritis, right shoulder: Secondary | ICD-10-CM | POA: Diagnosis not present

## 2020-05-06 DIAGNOSIS — I10 Essential (primary) hypertension: Secondary | ICD-10-CM | POA: Diagnosis not present

## 2020-05-06 DIAGNOSIS — D7589 Other specified diseases of blood and blood-forming organs: Secondary | ICD-10-CM | POA: Diagnosis not present

## 2020-05-09 LAB — COMPLETE METABOLIC PANEL WITH GFR
AG Ratio: 2.2 (calc) (ref 1.0–2.5)
ALT: 30 U/L (ref 9–46)
AST: 26 U/L (ref 10–35)
Albumin: 4.2 g/dL (ref 3.6–5.1)
Alkaline phosphatase (APISO): 69 U/L (ref 35–144)
BUN: 25 mg/dL (ref 7–25)
CO2: 28 mmol/L (ref 20–32)
Calcium: 9.8 mg/dL (ref 8.6–10.3)
Chloride: 103 mmol/L (ref 98–110)
Creat: 0.92 mg/dL (ref 0.70–1.18)
GFR, Est African American: 97 mL/min/{1.73_m2} (ref >=60)
GFR, Est Non African American: 84 mL/min/{1.73_m2} (ref >=60)
Globulin: 1.9 g/dL (calc) (ref 1.9–3.7)
Glucose, Bld: 97 mg/dL (ref 65–99)
Potassium: 4.8 mmol/L (ref 3.5–5.3)
Sodium: 137 mmol/L (ref 135–146)
Total Bilirubin: 0.8 mg/dL (ref 0.2–1.2)
Total Protein: 6.1 g/dL (ref 6.1–8.1)

## 2020-05-09 LAB — VITAMIN B12: Vitamin B-12: 495 pg/mL (ref 200–1100)

## 2020-05-09 LAB — CBC
HCT: 36.7 % — ABNORMAL LOW (ref 38.5–50.0)
Hemoglobin: 12.8 g/dL — ABNORMAL LOW (ref 13.2–17.1)
MCH: 36.5 pg — ABNORMAL HIGH (ref 27.0–33.0)
MCHC: 34.9 g/dL (ref 32.0–36.0)
MCV: 104.6 fL — ABNORMAL HIGH (ref 80.0–100.0)
MPV: 11.9 fL (ref 7.5–12.5)
Platelets: 133 10*3/uL — ABNORMAL LOW (ref 140–400)
RBC: 3.51 10*6/uL — ABNORMAL LOW (ref 4.20–5.80)
RDW: 12.9 % (ref 11.0–15.0)
WBC: 4.6 10*3/uL (ref 3.8–10.8)

## 2020-05-09 LAB — FOLATE RBC: RBC Folate: 780 ng/mL RBC (ref >280)

## 2020-05-19 NOTE — Progress Notes (Signed)
Referring-Natalie Sheppard Coil, DO Reason for referral-hypotension  HPI: 70 year old male for evaluation of hypotension at request of Emeterio Reeve, DO.  Patient typically does not have dyspnea on exertion, orthopnea, PND, pedal edema, palpitations or syncope.  Over the past 6 months he has had problems with hypotension following breakfast.  It does not occur with other meals.  Typically occurs 30 minutes after eating and he feels weak and dizzy with standing.  He lays down with his feet up and feels better after 30 to 45 minutes.  He discontinued his olmesartan approximately 5 days ago.  He continues to follow his blood pressure at this point.  Current Outpatient Medications  Medication Sig Dispense Refill   Ascorbic Acid (VITAMIN C PO) Take by mouth.     Cholecalciferol 25 MCG (1000 UT) tablet Take by mouth.     Cinnamon 500 MG capsule Take by mouth.     doxazosin (CARDURA) 2 MG tablet Take 1 tablet (2 mg total) by mouth daily. 90 tablet 1   L-Lysine 1000 MG TABS Take by mouth.     loratadine (CLARITIN) 10 MG tablet Take 10 mg by mouth daily.     valACYclovir (VALTREX) 1000 MG tablet TAKE 1/2 TABLET BY MOUTH DAILY AS NEEDED 45 tablet 1   vitamin E 400 UNIT capsule Take 400 Units by mouth 2 (two) times daily.      olmesartan (BENICAR) 20 MG tablet Take 1 tablet (20 mg total) by mouth daily. (Patient not taking: Reported on 05/22/2020) 90 tablet 0   No current facility-administered medications for this visit.    Allergies  Allergen Reactions   Bee Venom Anaphylaxis and Swelling     Past Medical History:  Diagnosis Date   Allergy    Arthritis    hands, hips   BPH associated with nocturia    DJD of right shoulder    Essential hypertension 01/05/2015   Shingles 12/2017    Past Surgical History:  Procedure Laterality Date   CATARACT EXTRACTION Left 2013   RETINAL DETACHMENT SURGERY  2012   REVERSE SHOULDER ARTHROPLASTY Right 04/24/2019   Procedure: RIGHT  REVERSE SHOULDER ARTHROPLASTY;  Surgeon: Hiram Gash, MD;  Location: [facility];  Service: Orthopedics;  Laterality: Right;   ROTATOR CUFF REPAIR Right 2010   TONSILECTOMY, ADENOIDECTOMY, BILATERAL MYRINGOTOMY AND TUBES  1968   VASECTOMY      Social History   Socioeconomic History   Marital status: Divorced    Spouse name: Not on file   Number of children: 3   Years of education: 16   Highest education level: Bachelor's degree (e.g., BA, AB, BS)  Occupational History   Occupation: retired    Comment: communications  Tobacco Use   Smoking status: Never Smoker   Smokeless tobacco: Never Used  Scientific laboratory technician Use: Never used  Substance and Sexual Activity   Alcohol use: Yes    Alcohol/week: 2.0 standard drinks    Types: 2 Cans of beer per week    Comment: occ   Drug use: No   Sexual activity: Yes  Other Topics Concern   Not on file  Social History Narrative   Did work for [employer]. Caffeine 1 cup of coffee in the morning.   Social Determinants of Health   Financial Resource Strain: Not on file  Food Insecurity: Not on file  Transportation Needs: Not on file  Physical Activity: Not on file  Stress: Not on file  Social Connections:  Not on file  Intimate Partner Violence: Not on file    Family History  Problem Relation Age of Onset   Cancer Mother    Diabetes Father    Stroke Father    Stroke Brother     ROS: no fevers or chills, productive cough, hemoptysis, dysphasia, odynophagia, melena, hematochezia, dysuria, hematuria, rash, seizure activity, orthopnea, PND, pedal edema, claudication. Remaining systems are negative.  Physical Exam:   Blood pressure (!) 170/100, pulse 64, height 5\' 10"  (1.778 m), weight 173 lb 6.4 oz (78.7 kg), SpO2 96 %.  General:  Well developed/well nourished in NAD Skin warm/dry Patient not depressed No peripheral clubbing Back-normal HEENT-normal/normal eyelids Neck supple/normal  carotid upstroke bilaterally; no bruits; no JVD; no thyromegaly chest - CTA/ normal expansion CV - RRR/normal S1 and S2; no murmurs, rubs or gallops;  PMI nondisplaced Abdomen -NT/ND, no HSM, no mass, + bowel sounds, no bruit 2+ femoral pulses, no bruits Ext-no edema, chords, 2+ DP Neuro-grossly nonfocal  ECG -sinus rhythm at a rate of 64, no ST changes.    Personally reviewed  A/P  1 postprandial hypotension-patient states his blood pressure decreases after eating breakfast.  It does not occur with other meals.  This is likely related to vasodilation of the gastric bed following eating.  He states his blood pressure has been controlled despite discontinuing his olmesartan.  He will continue off and we will follow blood pressure.  May add back lower doses if needed.  I have asked him to eat small breakfast and increase hydration prior to eating breakfast.  2 hypertension-as outlined above he follows his blood pressure closely and it is typically controlled.  It has been controlled off of olmesartan and he will continue to follow we will adjust as needed.  Kirk Ruths, MD

## 2020-05-22 ENCOUNTER — Ambulatory Visit (INDEPENDENT_AMBULATORY_CARE_PROVIDER_SITE_OTHER): Payer: Medicare Other | Admitting: Cardiology

## 2020-05-22 ENCOUNTER — Other Ambulatory Visit: Payer: Self-pay

## 2020-05-22 ENCOUNTER — Encounter: Payer: Self-pay | Admitting: Cardiology

## 2020-05-22 VITALS — BP 170/100 | HR 64 | Ht 70.0 in | Wt 173.4 lb

## 2020-05-22 DIAGNOSIS — I10 Essential (primary) hypertension: Secondary | ICD-10-CM | POA: Diagnosis not present

## 2020-05-22 DIAGNOSIS — I951 Orthostatic hypotension: Secondary | ICD-10-CM

## 2020-05-22 NOTE — Patient Instructions (Signed)
  Follow-Up: At [facility], you and your health needs are our priority.  As part of our continuing mission to provide you with exceptional heart care, we have created designated Provider Care Teams.  These Care Teams include your primary Cardiologist (physician) and Advanced Practice Providers (APPs -  Physician Assistants and Nurse Practitioners) who all work together to provide you with the care you need, when you need it.  We recommend signing up for the patient portal called "MyChart".  Sign up information is provided on this After Visit Summary.  MyChart is used to connect with patients for Virtual Visits (Telemedicine).  Patients are able to view lab/test results, encounter notes, upcoming appointments, etc.  Non-urgent messages can be sent to your provider as well.   To learn more about what you can do with MyChart, go to [email or web address].    Your next appointment:   3 month(s)  The format for your next appointment:   In Person  Provider:   Brian Crenshaw, MD    

## 2020-06-04 DIAGNOSIS — H35411 Lattice degeneration of retina, right eye: Secondary | ICD-10-CM | POA: Diagnosis not present

## 2020-06-04 DIAGNOSIS — H31012 Macula scars of posterior pole (postinflammatory) (post-traumatic), left eye: Secondary | ICD-10-CM | POA: Diagnosis not present

## 2020-06-04 DIAGNOSIS — H43811 Vitreous degeneration, right eye: Secondary | ICD-10-CM | POA: Diagnosis not present

## 2020-06-04 DIAGNOSIS — H31093 Other chorioretinal scars, bilateral: Secondary | ICD-10-CM | POA: Diagnosis not present

## 2020-06-16 ENCOUNTER — Encounter: Payer: Self-pay | Admitting: Osteopathic Medicine

## 2020-06-17 ENCOUNTER — Other Ambulatory Visit: Payer: Self-pay | Admitting: Osteopathic Medicine

## 2020-07-28 ENCOUNTER — Other Ambulatory Visit: Payer: Self-pay | Admitting: Osteopathic Medicine

## 2020-07-30 DIAGNOSIS — H31012 Macula scars of posterior pole (postinflammatory) (post-traumatic), left eye: Secondary | ICD-10-CM | POA: Diagnosis not present

## 2020-07-30 DIAGNOSIS — H31093 Other chorioretinal scars, bilateral: Secondary | ICD-10-CM | POA: Diagnosis not present

## 2020-07-30 DIAGNOSIS — H43811 Vitreous degeneration, right eye: Secondary | ICD-10-CM | POA: Diagnosis not present

## 2020-07-30 DIAGNOSIS — H35411 Lattice degeneration of retina, right eye: Secondary | ICD-10-CM | POA: Diagnosis not present

## 2020-08-12 ENCOUNTER — Other Ambulatory Visit: Payer: Self-pay | Admitting: Osteopathic Medicine

## 2020-08-19 ENCOUNTER — Ambulatory Visit (INDEPENDENT_AMBULATORY_CARE_PROVIDER_SITE_OTHER): Payer: Medicare Other | Admitting: Osteopathic Medicine

## 2020-08-19 VITALS — BP 127/74 | HR 65 | Temp 97.7°F | Wt 161.6 lb

## 2020-08-19 DIAGNOSIS — Z0000 Encounter for general adult medical examination without abnormal findings: Secondary | ICD-10-CM | POA: Diagnosis not present

## 2020-08-19 NOTE — Progress Notes (Addendum)
[place] VISIT  08/19/2020  Telephone Visit Disclaimer This Medicare AWV was conducted by telephone due to national recommendations for restrictions regarding the COVID-19 Pandemic (e.g. social distancing).  I verified, using two identifiers, that I am speaking with Billy Landry or their authorized healthcare agent. I discussed the limitations, risks, security, and privacy concerns of performing an evaluation and management service by telephone and the potential availability of an in-person appointment in the future. The patient expressed understanding and agreed to proceed.  Location of Patient: Home Location of Provider (nurse):  In the office.  Subjective:    Billy Landry is a 70 y.o. male patient of Billy Reeve, DO who had a Medicare Annual Wellness Visit today via telephone. Billy Landry is Retired and lives with an adult companion. he has 3 children. he reports that he is socially active and does interact with friends/family regularly. he is moderately physically active and enjoys gardening.  Patient Care Team: Billy Reeve, DO as PCP - General (Osteopathic Medicine)  Advanced Directives 08/19/2020 05/20/2019 05/13/2019 04/24/2019 04/16/2019 01/29/2018 01/05/2015  Does Patient Have a Medical Advance Directive? Yes Yes Yes Yes Yes Yes No  Type of Advance Directive Living will Living will Living will - Living will Living will -  Does patient want to make changes to medical advance directive? No - Patient declined No - Patient declined No - Patient declined No - Patient declined No - Patient declined No - Patient declined -  Would patient like information on creating a medical advance directive? - No - Patient declined No - Patient declined No - Patient declined - - No - patient declined information    Hospital Utilization Over the Past 12 Months: # of hospitalizations or ER visits: 0 # of surgeries: 1  Review of Systems    Patient reports that his overall  health is better compared to last year.  History obtained from chart review and the patient  Patient Reported Readings (BP, Pulse, CBG, Weight, etc) BP: 127/74 Pulse: 65 Temp: 97.7 Weight: 161.6  Pain Assessment Pain : 0-10 Pain Score: 3  Pain Type: Chronic pain Pain Location: Hip Pain Orientation: Lower,Right,Left Pain Radiating Towards: back Pain Descriptors / Indicators: Aching Pain Onset: More than a month ago Pain Frequency: Intermittent Pain Relieving Factors: meloxicam  Pain Relieving Factors: meloxicam  Current Medications & Allergies (verified) Allergies as of 08/19/2020      Reactions   Bee Venom Anaphylaxis, Swelling      Medication List       Accurate as of Aug 19, 2020  9:50 AM. If you have any questions, ask your nurse or doctor.        Cholecalciferol 25 MCG (1000 UT) tablet Take by mouth.   Cinnamon 500 MG capsule Take by mouth.   doxazosin 2 MG tablet Commonly known as: CARDURA Take 1 tablet (2 mg total) by mouth daily.   ferrous sulfate 325 (65 FE) MG tablet Take 45 mg by mouth daily with breakfast. Every other day.   L-Lysine 1000 MG Tabs Take by mouth.   loratadine 10 MG tablet Commonly known as: CLARITIN Take 10 mg by mouth daily.   Magnesium 250 MG Tabs   meloxicam 7.5 MG tablet Commonly known as: MOBIC TAKE 1 TABLET BY MOUTH TWICE DAILY AS NEEDED FOR ACHES AND/OR PAINS   olmesartan 20 MG tablet Commonly known as: BENICAR TAKE 1 TABLET(20 MG) BY MOUTH DAILY   valACYclovir 1000 MG tablet Commonly known as: VALTREX TAKE 1/2 TABLET  BY MOUTH DAILY AS NEEDED   VITAMIN C PO Take by mouth.   vitamin E 180 MG (400 UNITS) capsule Take 400 Units by mouth 2 (two) times daily.   Zinc 50 MG Tabs       History (reviewed): Past Medical History:  Diagnosis Date  . Allergy   . Arthritis    hands, hips  . BPH associated with nocturia   . DJD of right shoulder   . Essential hypertension 01/05/2015  . Shingles 12/2017    Past Surgical History:  Procedure Laterality Date  . CATARACT EXTRACTION Left 2013  . RETINAL DETACHMENT SURGERY  2012  . REVERSE SHOULDER ARTHROPLASTY Right 04/24/2019   Procedure: RIGHT REVERSE SHOULDER ARTHROPLASTY;  Surgeon: Billy Gash, MD;  Location: [facility];  Service: Orthopedics;  Laterality: Right;  . ROTATOR CUFF REPAIR Right 2010  . TONSILECTOMY, ADENOIDECTOMY, BILATERAL MYRINGOTOMY AND TUBES  1968  . VASECTOMY     Family History  Problem Relation Age of Onset  . Cancer Mother   . Diabetes Father   . Stroke Father   . Stroke Brother    Social History   Socioeconomic History  . Marital status: Single    Spouse name: Not on file  . Number of children: 3  . Years of education: 16  . Highest education level: Bachelor's degree (e.g., BA, AB, BS)  Occupational History  . Occupation: retired    Comment: communications  Tobacco Use  . Smoking status: Never Smoker  . Smokeless tobacco: Never Used  Vaping Use  . Vaping Use: Never used  Substance and Sexual Activity  . Alcohol use: Yes    Alcohol/week: 2.0 standard drinks    Types: 2 Cans of beer per week    Comment: occ  . Drug use: No  . Sexual activity: Yes  Other Topics Concern  . Not on file  Social History Narrative   Lives alone. He has three children and four grand children. Did work for [employer]. He enjoys golfing, gardening and feeding birds and squirrels.    Social Determinants of Health   Financial Resource Strain: Low Risk   . Difficulty of Paying Living Expenses: Not hard at all  Food Insecurity: No Food Insecurity  . Worried About Charity fundraiser in the Last Year: Never true  . Ran Out of Food in the Last Year: Never true  Transportation Needs: No Transportation Needs  . Lack of Transportation (Medical): No  . Lack of Transportation (Non-Medical): No  Physical Activity: Sufficiently Active  . Days of Exercise per Week: 7 days  . Minutes of Exercise per  Session: 30 min  Stress: No Stress Concern Present  . Feeling of Stress : Not at all  Social Connections: Moderately Isolated  . Frequency of Communication with Friends and Family: More than three times a week  . Frequency of Social Gatherings with Friends and Family: Twice a week  . Attends Religious Services: 1 to 4 times per year  . Active Member of Clubs or Organizations: No  . Attends Archivist Meetings: Never  . Marital Status: Divorced    Activities of Daily Living In your present state of health, do you have any difficulty performing the following activities: 08/19/2020  Hearing? N  Vision? Y  Comment due to the retina detachment and need for cataract extraction.  Difficulty concentrating or making decisions? N  Walking or climbing stairs? N  Dressing or bathing? N  Doing errands, shopping?  N  Preparing Food and eating ? N  Using the Toilet? N  In the past six months, have you accidently leaked urine? N  Do you have problems with loss of bowel control? N  Managing your Medications? N  Managing your Finances? N  Housekeeping or managing your Housekeeping? N  Some recent data might be hidden    Patient Education/ Literacy How often do you need to have someone help you when you read instructions, pamphlets, or other written materials from your doctor or pharmacy?: 1 - Never What is the last grade level you completed in school?: Bachelor's degree  Exercise Current Exercise Habits: Home exercise routine, Type of exercise: walking, Time (Minutes): 30, Frequency (Times/Week): >7, Weekly Exercise (Minutes/Week): 0, Intensity: Moderate, Exercise limited by: orthopedic condition(s)  Diet Patient reports consuming 2 meals a day and 3 snack(s) a day Patient reports that his primary diet is: Regular Patient reports that she does have regular access to food.   Depression Screen PHQ 2/9 Scores 08/19/2020 05/20/2019 01/30/2019 01/29/2018 01/24/2017  PHQ - 2 Score 0 0 0 0 0      Fall Risk Fall Risk  08/19/2020 05/20/2019 02/20/2019 01/29/2018 01/24/2017  Falls in the past year? 0 0 0 1 No  Comment - - Emmi Telephone Survey: data to providers prior to load - -  Number falls in past yr: 0 - - 0 -  Injury with Fall? 0 - - 0 -  Risk for fall due to : No Fall Risks No Fall Risks - - -  Follow up Falls evaluation completed Falls prevention discussed - Falls prevention discussed -     Objective:  Alto Gandolfo seemed alert and oriented and he participated appropriately during our telephone visit.  Blood Pressure Weight BMI  BP Readings from Last 3 Encounters:  08/19/20 127/74  05/22/20 (!) 170/100  04/22/20 135/68   Wt Readings from Last 3 Encounters:  08/19/20 161 lb 9.6 oz (73.3 kg)  05/22/20 173 lb 6.4 oz (78.7 kg)  04/22/20 180 lb 12.8 oz (82 kg)   BMI Readings from Last 1 Encounters:  08/19/20 23.19 kg/m    *Unable to obtain current vital signs, weight, and BMI due to telephone visit type  Hearing/Vision  . Toshio did not seem to have difficulty with hearing/understanding during the telephone conversation . Reports that he has had a formal eye exam by an eye care professional within the past year . Reports that he has not had a formal hearing evaluation within the past year *Unable to fully assess hearing and vision during telephone visit type  Cognitive Function: 6CIT Screen 08/19/2020 05/20/2019 01/29/2018  What Year? 0 points 0 points 0 points  What month? 0 points 0 points 0 points  What time? 0 points 0 points 0 points  Count back from 20 0 points 0 points 0 points  Months in reverse 0 points 0 points 0 points  Repeat phrase 0 points 0 points 0 points  Total Score 0 0 0   (Normal:0-7, Significant for Dysfunction: >8)  Normal Cognitive Function Screening: Yes   Immunization & Health Maintenance Record Immunization History  Administered Date(s) Administered  . Tdap 10/26/2015    Health Maintenance  Topic Date Due  . COVID-19  Vaccine (1) 09/04/2020 (Originally 07/14/1962)  . COLONOSCOPY (Pts 45-46yrs Insurance coverage will need to be confirmed)  03/11/2021 (Originally 03/29/2019)  . PNA vac Low Risk Adult (1 of 2 - PCV13) 08/19/2021 (Originally 07/15/2014)  . INFLUENZA VACCINE  10/26/2020  .  TETANUS/TDAP  10/25/2025  . Hepatitis C Screening  Completed  . HPV VACCINES  Aged Out       Assessment  This is a routine wellness examination for [employer].  Health Maintenance: Due or Overdue There are no preventive care reminders to display for this patient.  Billy Landry does not need a referral for [employer] Assistance: Care Management:   no Social Work:    no Prescription Assistance:  no Nutrition/Diabetes Education:  no   Plan:  Personalized Goals Goals Addressed              This Visit's Progress   .  Patient Stated (pt-stated)        08/19/2020 AWV Goal: Exercise for General Health   Patient will verbalize understanding of the benefits of increased physical activity:  Exercising regularly is important. It will improve your overall fitness, flexibility, and endurance.  Regular exercise also will improve your overall health. It can help you control your weight, reduce stress, and improve your bone density.  Over the next year, patient will increase physical activity as tolerated with a goal of at least 150 minutes of moderate physical activity per week.   You can tell that you are exercising at a moderate intensity if your heart starts beating faster and you start breathing faster but can still hold a conversation.  Moderate-intensity exercise ideas include:  Walking 1 mile (1.6 km) in about 15 minutes  Biking  Hiking  Golfing  Dancing  Water aerobics  Patient will verbalize understanding of everyday activities that increase physical activity by providing examples like the following: ? Yard work, such as: ? Pushing a Conservation officer, nature ? Raking and bagging leaves ? Washing your  car ? Pushing a stroller ? Shoveling snow ? Gardening ? Washing windows or floors  Patient will be able to explain general safety guidelines for exercising:   Before you start a new exercise program, talk with your health care provider.  Do not exercise so much that you hurt yourself, feel dizzy, or get very short of breath.  Wear comfortable clothes and wear shoes with good support.  Drink plenty of water while you exercise to prevent dehydration or heat stroke.  Work out until your breathing and your heartbeat get faster.       Personalized Health Maintenance & Screening Recommendations  Pneumococcal vaccine  Influenza vaccine Colorectal cancer screening Covid Vaccines, Shingrix vaccine   Patient declined all of the vaccines at this time. Discussed cologuard and colonoscopy for colorectal cancer screening and patient stated that he will let us know when he is ready for the referral.   Lung Cancer Screening Recommended: no (Low Dose CT Chest recommended if Age 59-80 years, 30 pack-year currently smoking OR have quit w/in past 15 years) Hepatitis C Screening recommended: no HIV Screening recommended: no  Advanced Directives: Written information was not prepared per patient's request.  Referrals & Orders No orders of the defined types were placed in this encounter.   Follow-up Plan . Follow-up with Billy Reeve, DO as planned . Let us know when you are ready for the referral for the cologuard or colonoscopy.  . Medicare wellness visit in one year.   I have personally reviewed and noted the following in the patient's chart:   . Medical and social history . Use of alcohol, tobacco or illicit drugs  . Current medications and supplements . Functional ability and status . Nutritional status . Physical activity . Advanced directives . List of  other physicians . Hospitalizations, surgeries, and ER visits in previous 12 months . Vitals . Screenings to include  cognitive, depression, and falls . Referrals and appointments  In addition, I have reviewed and discussed with Billy Landry certain preventive protocols, quality metrics, and best practice recommendations. A written personalized care plan for preventive services as well as general preventive health recommendations is available and can be mailed to the patient at his request.      Tinnie Gens  08/19/2020

## 2020-08-19 NOTE — Patient Instructions (Addendum)
[place] Maintenance Summary and Written Plan of Care  Billy Landry ,  Thank you for allowing me to perform your Medicare Annual Wellness Visit and for your ongoing commitment to your health.   Health Maintenance & Immunization History Health Maintenance  Topic Date Due  . COVID-19 Vaccine (1) 09/04/2020 (Originally 07/14/1962)  . COLONOSCOPY (Pts 45-20yrs Insurance coverage will need to be confirmed)  03/11/2021 (Originally 03/29/2019)  . PNA vac Low Risk Adult (1 of 2 - PCV13) 08/19/2021 (Originally 07/15/2014)  . INFLUENZA VACCINE  10/26/2020  . TETANUS/TDAP  10/25/2025  . Hepatitis C Screening  Completed  . HPV VACCINES  Aged Out   Immunization History  Administered Date(s) Administered  . Tdap 10/26/2015    These are the patient goals that we discussed: Goals Addressed              This Visit's Progress   .  Patient Stated (pt-stated)        08/19/2020 AWV Goal: Exercise for General Health   Patient will verbalize understanding of the benefits of increased physical activity:  Exercising regularly is important. It will improve your overall fitness, flexibility, and endurance.  Regular exercise also will improve your overall health. It can help you control your weight, reduce stress, and improve your bone density.  Over the next year, patient will increase physical activity as tolerated with a goal of at least 150 minutes of moderate physical activity per week.   You can tell that you are exercising at a moderate intensity if your heart starts beating faster and you start breathing faster but can still hold a conversation.  Moderate-intensity exercise ideas include:  Walking 1 mile (1.6 km) in about 15 minutes  Biking  Hiking  Golfing  Dancing  Water aerobics  Patient will verbalize understanding of everyday activities that increase physical activity by providing examples like the following: ? Yard work, such as: ? Pushing a  Conservation officer, nature ? Raking and bagging leaves ? Washing your car ? Pushing a stroller ? Shoveling snow ? Gardening ? Washing windows or floors  Patient will be able to explain general safety guidelines for exercising:   Before you start a new exercise program, talk with your health care provider.  Do not exercise so much that you hurt yourself, feel dizzy, or get very short of breath.  Wear comfortable clothes and wear shoes with good support.  Drink plenty of water while you exercise to prevent dehydration or heat stroke.  Work out until your breathing and your heartbeat get faster.         This is a list of Health Maintenance Items that are overdue or due now: Pneumococcal vaccine  Influenza vaccine Colorectal cancer screening Covid Vaccines, Shingrix vaccine   Orders/Referrals Placed Today: No orders of the defined types were placed in this encounter.  (Contact our referral department at [phone] if you have not spoken with someone about your referral appointment within the next 5 days)    Follow-up Plan . Follow-up with Emeterio Reeve, DO as planned . Let us know when you are ready for the referral for the cologuard or colonoscopy.  . Medicare wellness visit in one year.       Colonoscopy, Adult A colonoscopy is a procedure to look at the entire large intestine. This procedure is done using a long, thin, flexible tube that has a camera on the end. You may have a colonoscopy:  As a part of normal colorectal screening.  If you have certain symptoms, such as: ? A low number of red blood cells in your blood (anemia). ? Diarrhea that does not go away. ? Pain in your abdomen. ? Blood in your stool. A colonoscopy can help screen for and diagnose medical problems, including:  Tumors.  Extra tissue that grows where mucus forms (polyps).  Inflammation.  Areas of bleeding. Tell your health care provider about:  Any allergies you have.  All medicines you  are taking, including vitamins, herbs, eye drops, creams, and over-the-counter medicines.  Any problems you or family members have had with anesthetic medicines.  Any blood disorders you have.  Any surgeries you have had.  Any medical conditions you have.  Any problems you have had with having bowel movements.  Whether you are pregnant or may be pregnant. What are the risks? Generally, this is a safe procedure. However, problems may occur, including:  Bleeding.  Damage to your intestine.  Allergic reactions to medicines given during the procedure.  Infection. This is rare. What happens before the procedure? Eating and drinking restrictions Follow instructions from your health care provider about eating or drinking restrictions, which may include:  A few days before the procedure: ? Follow a low-fiber diet. ? Avoid nuts, seeds, dried fruit, raw fruits, and vegetables.  1-3 days before the procedure: ? Eat only gelatin dessert or ice pops. ? Drink only clear liquids, such as water, clear juice, clear broth or bouillon, black coffee or tea, or clear soft drinks or sports drinks. ? Avoid liquids that contain red or purple dye.  The day of the procedure: ? Do not eat solid foods. You may continue to drink clear liquids until up to 2 hours before the procedure. ? Do not eat or drink anything starting 2 hours before the procedure, or within the time period that your health care provider recommends. Bowel prep If you were prescribed a bowel prep to take by mouth (orally) to clean out your colon:  Take it as told by your health care provider. Starting the day before your procedure, you will need to drink a large amount of liquid medicine. The liquid will cause you to have many bowel movements of loose stool until your stool becomes almost clear or light green.  If your skin or the opening between the buttocks (anus) gets irritated from diarrhea, you may relieve the irritation  using: ? Wipes with medicine in them, such as adult wet wipes with aloe and vitamin E. ? A product to soothe skin, such as petroleum jelly.  If you vomit while drinking the bowel prep: ? Take a break for up to 60 minutes. ? Begin the bowel prep again. ? Call your health care provider if you keep vomiting or you cannot take the bowel prep without vomiting.  To clean out your colon, you may also be given: ? Laxative medicines. These help you have a bowel movement. ? Instructions for enema use. An enema is liquid medicine injected into your rectum. Medicines Ask your health care provider about:  Changing or stopping your regular medicines or supplements. This is especially important if you are taking iron supplements, diabetes medicines, or blood thinners.  Taking medicines such as aspirin and ibuprofen. These medicines can thin your blood. Do not take these medicines unless your health care provider tells you to take them.  Taking over-the-counter medicines, vitamins, herbs, and supplements. General instructions  Ask your health care provider what steps will be taken to help prevent infection.  These may include washing skin with a germ-killing soap.  Plan to have someone take you home from the hospital or clinic. What happens during the procedure?  An IV will be inserted into one of your veins.  You may be given one or more of the following: ? A medicine to help you relax (sedative). ? A medicine to numb the area (local anesthetic). ? A medicine to make you fall asleep (general anesthetic). This is rarely needed.  You will lie on your side with your knees bent.  The tube will: ? Have oil or gel put on it (be lubricated). ? Be inserted into your anus. ? Be gently eased through all parts of your large intestine.  Air will be sent into your colon to keep it open. This may cause some pressure or cramping.  Images will be taken with the camera and will appear on a screen.  A  small tissue sample may be removed to be looked at under a microscope (biopsy). The tissue may be sent to a lab for testing if any signs of problems are found.  If small polyps are found, they may be removed and checked for cancer cells.  When the procedure is finished, the tube will be removed. The procedure may vary among health care providers and hospitals.   What happens after the procedure?  Your blood pressure, heart rate, breathing rate, and blood oxygen level will be monitored until you leave the hospital or clinic.  You may have a small amount of blood in your stool.  You may pass gas and have mild cramping or bloating in your abdomen. This is caused by the air that was used to open your colon during the exam.  Do not drive for 24 hours after the procedure.  It is up to you to get the results of your procedure. Ask your health care provider, or the department that is doing the procedure, when your results will be ready. Summary  A colonoscopy is a procedure to look at the entire large intestine.  Follow instructions from your health care provider about eating and drinking before the procedure.  If you were prescribed an oral bowel prep to clean out your colon, take it as told by your health care provider.  During the colonoscopy, a flexible tube with a camera on its end is inserted into the anus and then passed into the other parts of the large intestine. This information is not intended to replace advice given to you by your health care provider. Make sure you discuss any questions you have with your health care provider. Document Revised: 10/05/2018 Document Reviewed: 10/05/2018 Elsevier Patient Education  [place] Maintenance, Male Adopting a healthy lifestyle and getting preventive care are important in promoting health and wellness. Ask your health care provider about:  The right schedule for you to have regular tests and exams.  Things you can do  on your own to prevent diseases and keep yourself healthy. What should I know about diet, weight, and exercise? Eat a healthy diet  Eat a diet that includes plenty of vegetables, fruits, low-fat dairy products, and lean protein.  Do not eat a lot of foods that are high in solid fats, added sugars, or sodium.   Maintain a healthy weight Body mass index (BMI) is a measurement that can be used to identify possible weight problems. It estimates body fat based on height and weight. Your health care provider can help determine  your BMI and help you achieve or maintain a healthy weight. Get regular exercise Get regular exercise. This is one of the most important things you can do for your health. Most adults should:  Exercise for at least 150 minutes each week. The exercise should increase your heart rate and make you sweat (moderate-intensity exercise).  Do strengthening exercises at least twice a week. This is in addition to the moderate-intensity exercise.  Spend less time sitting. Even light physical activity can be beneficial. Watch cholesterol and blood lipids Have your blood tested for lipids and cholesterol at 71 years of age, then have this test every 5 years. You may need to have your cholesterol levels checked more often if:  Your lipid or cholesterol levels are high.  You are older than 71 years of age.  You are at high risk for heart disease. What should I know about cancer screening? Many types of cancers can be detected early and may often be prevented. Depending on your health history and family history, you may need to have cancer screening at various ages. This may include screening for:  Colorectal cancer.  Prostate cancer.  Skin cancer.  Lung cancer. What should I know about heart disease, diabetes, and high blood pressure? Blood pressure and heart disease  High blood pressure causes heart disease and increases the risk of stroke. This is more likely to develop in  people who have high blood pressure readings, are of African descent, or are overweight.  Talk with your health care provider about your target blood pressure readings.  Have your blood pressure checked: ? Every 3-5 years if you are 25-23 years of age. ? Every year if you are 33 years old or older.  If you are between the ages of 94 and 28 and are a current or former smoker, ask your health care provider if you should have a one-time screening for abdominal aortic aneurysm (AAA). Diabetes Have regular diabetes screenings. This checks your fasting blood sugar level. Have the screening done:  Once every three years after age 29 if you are at a normal weight and have a low risk for diabetes.  More often and at a younger age if you are overweight or have a high risk for diabetes. What should I know about preventing infection? Hepatitis B If you have a higher risk for hepatitis B, you should be screened for this virus. Talk with your health care provider to find out if you are at risk for hepatitis B infection. Hepatitis C Blood testing is recommended for:  Everyone born from [birth year] through 1966.  Anyone with known risk factors for hepatitis C. Sexually transmitted infections (STIs)  You should be screened each year for STIs, including gonorrhea and chlamydia, if: ? You are sexually active and are younger than 71 years of age. ? You are older than 71 years of age and your health care provider tells you that you are at risk for this type of infection. ? Your sexual activity has changed since you were last screened, and you are at increased risk for chlamydia or gonorrhea. Ask your health care provider if you are at risk.  Ask your health care provider about whether you are at high risk for HIV. Your health care provider may recommend a prescription medicine to help prevent HIV infection. If you choose to take medicine to prevent HIV, you should first get tested for HIV. You should then be  tested every 3 months for as long as  you are taking the medicine. Follow these instructions at home: Lifestyle  Do not use any products that contain nicotine or tobacco, such as cigarettes, e-cigarettes, and chewing tobacco. If you need help quitting, ask your health care provider.  Do not use street drugs.  Do not share needles.  Ask your health care provider for help if you need support or information about quitting drugs. Alcohol use  Do not drink alcohol if your health care provider tells you not to drink.  If you drink alcohol: ? Limit how much you have to 0-2 drinks a day. ? Be aware of how much alcohol is in your drink. In the [place] one drink equals one 12 oz bottle of beer (355 mL), one 5 oz glass of wine (148 mL), or one 1 oz glass of hard liquor (44 mL). General instructions  Schedule regular health, dental, and eye exams.  Stay current with your vaccines.  Tell your health care provider if: ? You often feel depressed. ? You have ever been abused or do not feel safe at home. Summary  Adopting a healthy lifestyle and getting preventive care are important in promoting health and wellness.  Follow your health care provider's instructions about healthy diet, exercising, and getting tested or screened for diseases.  Follow your health care provider's instructions on monitoring your cholesterol and blood pressure. This information is not intended to replace advice given to you by your health care provider. Make sure you discuss any questions you have with your health care provider. Document Revised: 03/07/2018 Document Reviewed: 03/07/2018 Elsevier Patient Education  2021 [employer].

## 2020-08-21 ENCOUNTER — Ambulatory Visit: Payer: Medicare Other | Admitting: Cardiology

## 2020-08-28 NOTE — Progress Notes (Signed)
HPI: FU hypotension.  Patient previously evaluated for developing hypotension after eating breakfast.  Since last seen he has improved.  He has had no dizziness following breakfast (he has eaten smaller portions for breakfast, hydrated prior to breakfast and also is eating low carbs).  He otherwise denies dyspnea, chest pain, palpitations or syncope.  Current Outpatient Medications  Medication Sig Dispense Refill   Ascorbic Acid (VITAMIN C PO) Take by mouth.     Cholecalciferol 25 MCG (1000 UT) tablet Take by mouth.     Cinnamon 500 MG capsule Take by mouth.     ferrous sulfate 325 (65 FE) MG tablet Take 45 mg by mouth daily with breakfast. Every other day.     L-Lysine 1000 MG TABS Take by mouth.     loratadine (CLARITIN) 10 MG tablet Take 10 mg by mouth daily.     Magnesium 250 MG TABS      valACYclovir (VALTREX) 1000 MG tablet TAKE 1/2 TABLET BY MOUTH DAILY AS NEEDED 45 tablet 1   vitamin E 400 UNIT capsule Take 400 Units by mouth 2 (two) times daily.      Zinc 50 MG TABS      No current facility-administered medications for this visit.     Past Medical History:  Diagnosis Date   Allergy    Arthritis    hands, hips   BPH associated with nocturia    DJD of right shoulder    Essential hypertension 01/05/2015   Shingles 12/2017    Past Surgical History:  Procedure Laterality Date   CATARACT EXTRACTION Left 2013   RETINAL DETACHMENT SURGERY  2012   REVERSE SHOULDER ARTHROPLASTY Right 04/24/2019   Procedure: RIGHT REVERSE SHOULDER ARTHROPLASTY;  Surgeon: Hiram Gash, MD;  Location: [facility];  Service: Orthopedics;  Laterality: Right;   ROTATOR CUFF REPAIR Right 2010   TONSILECTOMY, ADENOIDECTOMY, BILATERAL MYRINGOTOMY AND TUBES  1968   VASECTOMY      Social History   Socioeconomic History   Marital status: Single    Spouse name: Not on file   Number of children: 3   Years of education: 16   Highest education level: Bachelor's degree (e.g., BA,  AB, BS)  Occupational History   Occupation: retired    Comment: communications  Tobacco Use   Smoking status: Never   Smokeless tobacco: Never  Vaping Use   Vaping Use: Never used  Substance and Sexual Activity   Alcohol use: Yes    Alcohol/week: 2.0 standard drinks    Types: 2 Cans of beer per week    Comment: occ   Drug use: No   Sexual activity: Yes  Other Topics Concern   Not on file  Social History Narrative   Lives alone. He has three children and four grand children. Did work for [employer]. He enjoys golfing, gardening and feeding birds and squirrels.    Social Determinants of Health   Financial Resource Strain: Low Risk    Difficulty of Paying Living Expenses: Not hard at all  Food Insecurity: No Food Insecurity   Worried About Charity fundraiser in the Last Year: Never true   [place]ritos in the Last Year: Never true  Transportation Needs: No Transportation Needs   Lack of Transportation (Medical): No   Lack of Transportation (Non-Medical): No  Physical Activity: Sufficiently Active   Days of Exercise per Week: 7 days   Minutes of Exercise per Session: 30 min  Stress: No Stress Concern Present   Feeling of Stress : Not at all  Social Connections: Moderately Isolated   Frequency of Communication with Friends and Family: More than three times a week   Frequency of Social Gatherings with Friends and Family: Twice a week   Attends Religious Services: 1 to 4 times per year   Active Member of [employer] or Organizations: No   Attends Music therapist: Never   Marital Status: Divorced  Human resources officer Violence: Not At Risk   Fear of Current or Ex-Partner: No   Emotionally Abused: No   Physically Abused: No   Sexually Abused: No    Family History  Problem Relation Age of Onset   Cancer Mother    Diabetes Father    Stroke Father    Stroke Brother     ROS: no fevers or chills, productive cough, hemoptysis, dysphasia, odynophagia,  melena, hematochezia, dysuria, hematuria, rash, seizure activity, orthopnea, PND, pedal edema, claudication. Remaining systems are negative.  Physical Exam: Well-developed well-nourished in no acute distress.  Skin is warm and dry.  HEENT is normal.  Neck is supple.  Chest is clear to auscultation with normal expansion.  Cardiovascular exam is regular rate and rhythm.  Abdominal exam nontender or distended. No masses palpated. Extremities show no edema. neuro grossly intact   A/P  1 postprandial hypotension-occurs only after eating breakfast.  This is felt secondary to vasodilatation of the gastric bed following eating.  His symptoms have improved since last office visit with eating small breakfast, low carb and increasing hydration prior to eating.  2 history of hypertension-olmesartan discontinued at last office visit and blood pressure remains controlled.  Kirk Ruths, MD

## 2020-09-02 ENCOUNTER — Other Ambulatory Visit: Payer: Self-pay | Admitting: Osteopathic Medicine

## 2020-09-02 DIAGNOSIS — B001 Herpesviral vesicular dermatitis: Secondary | ICD-10-CM

## 2020-09-10 ENCOUNTER — Other Ambulatory Visit: Payer: Self-pay

## 2020-09-10 ENCOUNTER — Ambulatory Visit (INDEPENDENT_AMBULATORY_CARE_PROVIDER_SITE_OTHER): Payer: Medicare Other | Admitting: Cardiology

## 2020-09-10 ENCOUNTER — Encounter: Payer: Self-pay | Admitting: Cardiology

## 2020-09-10 VITALS — BP 125/71 | HR 72 | Ht 70.0 in | Wt 162.2 lb

## 2020-09-10 DIAGNOSIS — I951 Orthostatic hypotension: Secondary | ICD-10-CM | POA: Diagnosis not present

## 2020-09-10 DIAGNOSIS — I10 Essential (primary) hypertension: Secondary | ICD-10-CM

## 2020-09-10 NOTE — Patient Instructions (Signed)

## 2020-10-14 DIAGNOSIS — R5383 Other fatigue: Secondary | ICD-10-CM | POA: Diagnosis not present

## 2020-10-14 DIAGNOSIS — J029 Acute pharyngitis, unspecified: Secondary | ICD-10-CM | POA: Diagnosis not present

## 2020-10-14 DIAGNOSIS — R52 Pain, unspecified: Secondary | ICD-10-CM | POA: Diagnosis not present

## 2020-10-14 DIAGNOSIS — U071 COVID-19: Secondary | ICD-10-CM | POA: Diagnosis not present

## 2020-10-14 DIAGNOSIS — R519 Headache, unspecified: Secondary | ICD-10-CM | POA: Diagnosis not present

## 2020-11-24 DIAGNOSIS — Z961 Presence of intraocular lens: Secondary | ICD-10-CM | POA: Diagnosis not present

## 2020-11-24 DIAGNOSIS — H2511 Age-related nuclear cataract, right eye: Secondary | ICD-10-CM | POA: Diagnosis not present

## 2020-11-24 DIAGNOSIS — H35413 Lattice degeneration of retina, bilateral: Secondary | ICD-10-CM | POA: Diagnosis not present

## 2020-11-24 DIAGNOSIS — H59812 Chorioretinal scars after surgery for detachment, left eye: Secondary | ICD-10-CM | POA: Diagnosis not present

## 2020-11-24 DIAGNOSIS — Z9889 Other specified postprocedural states: Secondary | ICD-10-CM | POA: Diagnosis not present

## 2020-11-29 ENCOUNTER — Encounter: Payer: Self-pay | Admitting: Osteopathic Medicine

## 2020-12-09 DIAGNOSIS — M9907 Segmental and somatic dysfunction of upper extremity: Secondary | ICD-10-CM | POA: Diagnosis not present

## 2020-12-09 DIAGNOSIS — M9903 Segmental and somatic dysfunction of lumbar region: Secondary | ICD-10-CM | POA: Diagnosis not present

## 2020-12-09 DIAGNOSIS — M4726 Other spondylosis with radiculopathy, lumbar region: Secondary | ICD-10-CM | POA: Diagnosis not present

## 2020-12-09 DIAGNOSIS — M25541 Pain in joints of right hand: Secondary | ICD-10-CM | POA: Diagnosis not present

## 2020-12-09 DIAGNOSIS — M4728 Other spondylosis with radiculopathy, sacral and sacrococcygeal region: Secondary | ICD-10-CM | POA: Diagnosis not present

## 2020-12-09 DIAGNOSIS — M9904 Segmental and somatic dysfunction of sacral region: Secondary | ICD-10-CM | POA: Diagnosis not present

## 2020-12-09 DIAGNOSIS — M25532 Pain in left wrist: Secondary | ICD-10-CM | POA: Diagnosis not present

## 2020-12-10 DIAGNOSIS — M4728 Other spondylosis with radiculopathy, sacral and sacrococcygeal region: Secondary | ICD-10-CM | POA: Diagnosis not present

## 2020-12-10 DIAGNOSIS — M25541 Pain in joints of right hand: Secondary | ICD-10-CM | POA: Diagnosis not present

## 2020-12-10 DIAGNOSIS — H25811 Combined forms of age-related cataract, right eye: Secondary | ICD-10-CM | POA: Diagnosis not present

## 2020-12-10 DIAGNOSIS — H35413 Lattice degeneration of retina, bilateral: Secondary | ICD-10-CM | POA: Diagnosis not present

## 2020-12-10 DIAGNOSIS — Z961 Presence of intraocular lens: Secondary | ICD-10-CM | POA: Diagnosis not present

## 2020-12-10 DIAGNOSIS — M25532 Pain in left wrist: Secondary | ICD-10-CM | POA: Diagnosis not present

## 2020-12-10 DIAGNOSIS — M9907 Segmental and somatic dysfunction of upper extremity: Secondary | ICD-10-CM | POA: Diagnosis not present

## 2020-12-10 DIAGNOSIS — H52213 Irregular astigmatism, bilateral: Secondary | ICD-10-CM | POA: Diagnosis not present

## 2020-12-10 DIAGNOSIS — M9903 Segmental and somatic dysfunction of lumbar region: Secondary | ICD-10-CM | POA: Diagnosis not present

## 2020-12-10 DIAGNOSIS — M9904 Segmental and somatic dysfunction of sacral region: Secondary | ICD-10-CM | POA: Diagnosis not present

## 2020-12-10 DIAGNOSIS — Z9889 Other specified postprocedural states: Secondary | ICD-10-CM | POA: Diagnosis not present

## 2020-12-10 DIAGNOSIS — M4726 Other spondylosis with radiculopathy, lumbar region: Secondary | ICD-10-CM | POA: Diagnosis not present

## 2020-12-10 DIAGNOSIS — H59812 Chorioretinal scars after surgery for detachment, left eye: Secondary | ICD-10-CM | POA: Diagnosis not present

## 2020-12-14 ENCOUNTER — Other Ambulatory Visit: Payer: Self-pay | Admitting: Osteopathic Medicine

## 2020-12-14 DIAGNOSIS — M4726 Other spondylosis with radiculopathy, lumbar region: Secondary | ICD-10-CM | POA: Diagnosis not present

## 2020-12-14 DIAGNOSIS — M25541 Pain in joints of right hand: Secondary | ICD-10-CM | POA: Diagnosis not present

## 2020-12-14 DIAGNOSIS — M25532 Pain in left wrist: Secondary | ICD-10-CM | POA: Diagnosis not present

## 2020-12-14 DIAGNOSIS — M9904 Segmental and somatic dysfunction of sacral region: Secondary | ICD-10-CM | POA: Diagnosis not present

## 2020-12-14 DIAGNOSIS — M9907 Segmental and somatic dysfunction of upper extremity: Secondary | ICD-10-CM | POA: Diagnosis not present

## 2020-12-14 DIAGNOSIS — M4728 Other spondylosis with radiculopathy, sacral and sacrococcygeal region: Secondary | ICD-10-CM | POA: Diagnosis not present

## 2020-12-14 DIAGNOSIS — M9903 Segmental and somatic dysfunction of lumbar region: Secondary | ICD-10-CM | POA: Diagnosis not present

## 2020-12-14 DIAGNOSIS — B001 Herpesviral vesicular dermatitis: Secondary | ICD-10-CM

## 2020-12-15 DIAGNOSIS — M9904 Segmental and somatic dysfunction of sacral region: Secondary | ICD-10-CM | POA: Diagnosis not present

## 2020-12-15 DIAGNOSIS — M25541 Pain in joints of right hand: Secondary | ICD-10-CM | POA: Diagnosis not present

## 2020-12-15 DIAGNOSIS — M4728 Other spondylosis with radiculopathy, sacral and sacrococcygeal region: Secondary | ICD-10-CM | POA: Diagnosis not present

## 2020-12-15 DIAGNOSIS — M9903 Segmental and somatic dysfunction of lumbar region: Secondary | ICD-10-CM | POA: Diagnosis not present

## 2020-12-15 DIAGNOSIS — M4726 Other spondylosis with radiculopathy, lumbar region: Secondary | ICD-10-CM | POA: Diagnosis not present

## 2020-12-15 DIAGNOSIS — M25532 Pain in left wrist: Secondary | ICD-10-CM | POA: Diagnosis not present

## 2020-12-15 DIAGNOSIS — M9907 Segmental and somatic dysfunction of upper extremity: Secondary | ICD-10-CM | POA: Diagnosis not present

## 2020-12-16 DIAGNOSIS — M25532 Pain in left wrist: Secondary | ICD-10-CM | POA: Diagnosis not present

## 2020-12-16 DIAGNOSIS — M9907 Segmental and somatic dysfunction of upper extremity: Secondary | ICD-10-CM | POA: Diagnosis not present

## 2020-12-16 DIAGNOSIS — M4726 Other spondylosis with radiculopathy, lumbar region: Secondary | ICD-10-CM | POA: Diagnosis not present

## 2020-12-16 DIAGNOSIS — M9903 Segmental and somatic dysfunction of lumbar region: Secondary | ICD-10-CM | POA: Diagnosis not present

## 2020-12-16 DIAGNOSIS — M9904 Segmental and somatic dysfunction of sacral region: Secondary | ICD-10-CM | POA: Diagnosis not present

## 2020-12-16 DIAGNOSIS — M4728 Other spondylosis with radiculopathy, sacral and sacrococcygeal region: Secondary | ICD-10-CM | POA: Diagnosis not present

## 2020-12-16 DIAGNOSIS — M25541 Pain in joints of right hand: Secondary | ICD-10-CM | POA: Diagnosis not present

## 2020-12-17 DIAGNOSIS — M4726 Other spondylosis with radiculopathy, lumbar region: Secondary | ICD-10-CM | POA: Diagnosis not present

## 2020-12-17 DIAGNOSIS — M9903 Segmental and somatic dysfunction of lumbar region: Secondary | ICD-10-CM | POA: Diagnosis not present

## 2020-12-17 DIAGNOSIS — M9907 Segmental and somatic dysfunction of upper extremity: Secondary | ICD-10-CM | POA: Diagnosis not present

## 2020-12-17 DIAGNOSIS — M4728 Other spondylosis with radiculopathy, sacral and sacrococcygeal region: Secondary | ICD-10-CM | POA: Diagnosis not present

## 2020-12-17 DIAGNOSIS — M9904 Segmental and somatic dysfunction of sacral region: Secondary | ICD-10-CM | POA: Diagnosis not present

## 2020-12-17 DIAGNOSIS — M25541 Pain in joints of right hand: Secondary | ICD-10-CM | POA: Diagnosis not present

## 2020-12-17 DIAGNOSIS — M25532 Pain in left wrist: Secondary | ICD-10-CM | POA: Diagnosis not present

## 2020-12-21 DIAGNOSIS — M4726 Other spondylosis with radiculopathy, lumbar region: Secondary | ICD-10-CM | POA: Diagnosis not present

## 2020-12-21 DIAGNOSIS — M9904 Segmental and somatic dysfunction of sacral region: Secondary | ICD-10-CM | POA: Diagnosis not present

## 2020-12-21 DIAGNOSIS — M4728 Other spondylosis with radiculopathy, sacral and sacrococcygeal region: Secondary | ICD-10-CM | POA: Diagnosis not present

## 2020-12-21 DIAGNOSIS — M9907 Segmental and somatic dysfunction of upper extremity: Secondary | ICD-10-CM | POA: Diagnosis not present

## 2020-12-21 DIAGNOSIS — M25541 Pain in joints of right hand: Secondary | ICD-10-CM | POA: Diagnosis not present

## 2020-12-21 DIAGNOSIS — M25532 Pain in left wrist: Secondary | ICD-10-CM | POA: Diagnosis not present

## 2020-12-21 DIAGNOSIS — M9903 Segmental and somatic dysfunction of lumbar region: Secondary | ICD-10-CM | POA: Diagnosis not present

## 2020-12-22 DIAGNOSIS — M25532 Pain in left wrist: Secondary | ICD-10-CM | POA: Diagnosis not present

## 2020-12-22 DIAGNOSIS — M25541 Pain in joints of right hand: Secondary | ICD-10-CM | POA: Diagnosis not present

## 2020-12-22 DIAGNOSIS — M9907 Segmental and somatic dysfunction of upper extremity: Secondary | ICD-10-CM | POA: Diagnosis not present

## 2020-12-22 DIAGNOSIS — M9904 Segmental and somatic dysfunction of sacral region: Secondary | ICD-10-CM | POA: Diagnosis not present

## 2020-12-22 DIAGNOSIS — M4726 Other spondylosis with radiculopathy, lumbar region: Secondary | ICD-10-CM | POA: Diagnosis not present

## 2020-12-22 DIAGNOSIS — M9903 Segmental and somatic dysfunction of lumbar region: Secondary | ICD-10-CM | POA: Diagnosis not present

## 2020-12-22 DIAGNOSIS — M4728 Other spondylosis with radiculopathy, sacral and sacrococcygeal region: Secondary | ICD-10-CM | POA: Diagnosis not present

## 2020-12-23 DIAGNOSIS — M9907 Segmental and somatic dysfunction of upper extremity: Secondary | ICD-10-CM | POA: Diagnosis not present

## 2020-12-23 DIAGNOSIS — M9903 Segmental and somatic dysfunction of lumbar region: Secondary | ICD-10-CM | POA: Diagnosis not present

## 2020-12-23 DIAGNOSIS — M25532 Pain in left wrist: Secondary | ICD-10-CM | POA: Diagnosis not present

## 2020-12-23 DIAGNOSIS — M4726 Other spondylosis with radiculopathy, lumbar region: Secondary | ICD-10-CM | POA: Diagnosis not present

## 2020-12-23 DIAGNOSIS — M25541 Pain in joints of right hand: Secondary | ICD-10-CM | POA: Diagnosis not present

## 2020-12-23 DIAGNOSIS — M9904 Segmental and somatic dysfunction of sacral region: Secondary | ICD-10-CM | POA: Diagnosis not present

## 2020-12-23 DIAGNOSIS — M4728 Other spondylosis with radiculopathy, sacral and sacrococcygeal region: Secondary | ICD-10-CM | POA: Diagnosis not present

## 2020-12-24 DIAGNOSIS — M25532 Pain in left wrist: Secondary | ICD-10-CM | POA: Diagnosis not present

## 2020-12-24 DIAGNOSIS — M9907 Segmental and somatic dysfunction of upper extremity: Secondary | ICD-10-CM | POA: Diagnosis not present

## 2020-12-24 DIAGNOSIS — M4728 Other spondylosis with radiculopathy, sacral and sacrococcygeal region: Secondary | ICD-10-CM | POA: Diagnosis not present

## 2020-12-24 DIAGNOSIS — M25541 Pain in joints of right hand: Secondary | ICD-10-CM | POA: Diagnosis not present

## 2020-12-24 DIAGNOSIS — M9903 Segmental and somatic dysfunction of lumbar region: Secondary | ICD-10-CM | POA: Diagnosis not present

## 2020-12-24 DIAGNOSIS — M4726 Other spondylosis with radiculopathy, lumbar region: Secondary | ICD-10-CM | POA: Diagnosis not present

## 2020-12-24 DIAGNOSIS — M9904 Segmental and somatic dysfunction of sacral region: Secondary | ICD-10-CM | POA: Diagnosis not present

## 2020-12-28 DIAGNOSIS — M9903 Segmental and somatic dysfunction of lumbar region: Secondary | ICD-10-CM | POA: Diagnosis not present

## 2020-12-28 DIAGNOSIS — M25532 Pain in left wrist: Secondary | ICD-10-CM | POA: Diagnosis not present

## 2020-12-28 DIAGNOSIS — M25541 Pain in joints of right hand: Secondary | ICD-10-CM | POA: Diagnosis not present

## 2020-12-28 DIAGNOSIS — M4726 Other spondylosis with radiculopathy, lumbar region: Secondary | ICD-10-CM | POA: Diagnosis not present

## 2020-12-28 DIAGNOSIS — M9904 Segmental and somatic dysfunction of sacral region: Secondary | ICD-10-CM | POA: Diagnosis not present

## 2020-12-28 DIAGNOSIS — M4728 Other spondylosis with radiculopathy, sacral and sacrococcygeal region: Secondary | ICD-10-CM | POA: Diagnosis not present

## 2020-12-28 DIAGNOSIS — M9907 Segmental and somatic dysfunction of upper extremity: Secondary | ICD-10-CM | POA: Diagnosis not present

## 2020-12-29 DIAGNOSIS — M25532 Pain in left wrist: Secondary | ICD-10-CM | POA: Diagnosis not present

## 2020-12-29 DIAGNOSIS — M4728 Other spondylosis with radiculopathy, sacral and sacrococcygeal region: Secondary | ICD-10-CM | POA: Diagnosis not present

## 2020-12-29 DIAGNOSIS — M4726 Other spondylosis with radiculopathy, lumbar region: Secondary | ICD-10-CM | POA: Diagnosis not present

## 2020-12-29 DIAGNOSIS — M9904 Segmental and somatic dysfunction of sacral region: Secondary | ICD-10-CM | POA: Diagnosis not present

## 2020-12-29 DIAGNOSIS — M9903 Segmental and somatic dysfunction of lumbar region: Secondary | ICD-10-CM | POA: Diagnosis not present

## 2020-12-29 DIAGNOSIS — M25541 Pain in joints of right hand: Secondary | ICD-10-CM | POA: Diagnosis not present

## 2020-12-29 DIAGNOSIS — M9907 Segmental and somatic dysfunction of upper extremity: Secondary | ICD-10-CM | POA: Diagnosis not present

## 2020-12-30 DIAGNOSIS — M9907 Segmental and somatic dysfunction of upper extremity: Secondary | ICD-10-CM | POA: Diagnosis not present

## 2020-12-30 DIAGNOSIS — M25541 Pain in joints of right hand: Secondary | ICD-10-CM | POA: Diagnosis not present

## 2020-12-30 DIAGNOSIS — M4728 Other spondylosis with radiculopathy, sacral and sacrococcygeal region: Secondary | ICD-10-CM | POA: Diagnosis not present

## 2020-12-30 DIAGNOSIS — M25532 Pain in left wrist: Secondary | ICD-10-CM | POA: Diagnosis not present

## 2020-12-30 DIAGNOSIS — M9903 Segmental and somatic dysfunction of lumbar region: Secondary | ICD-10-CM | POA: Diagnosis not present

## 2020-12-30 DIAGNOSIS — M9904 Segmental and somatic dysfunction of sacral region: Secondary | ICD-10-CM | POA: Diagnosis not present

## 2020-12-30 DIAGNOSIS — M4726 Other spondylosis with radiculopathy, lumbar region: Secondary | ICD-10-CM | POA: Diagnosis not present

## 2020-12-31 DIAGNOSIS — M25532 Pain in left wrist: Secondary | ICD-10-CM | POA: Diagnosis not present

## 2020-12-31 DIAGNOSIS — M9904 Segmental and somatic dysfunction of sacral region: Secondary | ICD-10-CM | POA: Diagnosis not present

## 2020-12-31 DIAGNOSIS — M4728 Other spondylosis with radiculopathy, sacral and sacrococcygeal region: Secondary | ICD-10-CM | POA: Diagnosis not present

## 2020-12-31 DIAGNOSIS — M25541 Pain in joints of right hand: Secondary | ICD-10-CM | POA: Diagnosis not present

## 2020-12-31 DIAGNOSIS — M4726 Other spondylosis with radiculopathy, lumbar region: Secondary | ICD-10-CM | POA: Diagnosis not present

## 2020-12-31 DIAGNOSIS — M9907 Segmental and somatic dysfunction of upper extremity: Secondary | ICD-10-CM | POA: Diagnosis not present

## 2020-12-31 DIAGNOSIS — M9903 Segmental and somatic dysfunction of lumbar region: Secondary | ICD-10-CM | POA: Diagnosis not present

## 2021-01-04 DIAGNOSIS — M25541 Pain in joints of right hand: Secondary | ICD-10-CM | POA: Diagnosis not present

## 2021-01-04 DIAGNOSIS — M4726 Other spondylosis with radiculopathy, lumbar region: Secondary | ICD-10-CM | POA: Diagnosis not present

## 2021-01-04 DIAGNOSIS — M9907 Segmental and somatic dysfunction of upper extremity: Secondary | ICD-10-CM | POA: Diagnosis not present

## 2021-01-04 DIAGNOSIS — M25532 Pain in left wrist: Secondary | ICD-10-CM | POA: Diagnosis not present

## 2021-01-04 DIAGNOSIS — M9903 Segmental and somatic dysfunction of lumbar region: Secondary | ICD-10-CM | POA: Diagnosis not present

## 2021-01-04 DIAGNOSIS — M4728 Other spondylosis with radiculopathy, sacral and sacrococcygeal region: Secondary | ICD-10-CM | POA: Diagnosis not present

## 2021-01-04 DIAGNOSIS — M9904 Segmental and somatic dysfunction of sacral region: Secondary | ICD-10-CM | POA: Diagnosis not present

## 2021-01-05 DIAGNOSIS — M9904 Segmental and somatic dysfunction of sacral region: Secondary | ICD-10-CM | POA: Diagnosis not present

## 2021-01-05 DIAGNOSIS — M25532 Pain in left wrist: Secondary | ICD-10-CM | POA: Diagnosis not present

## 2021-01-05 DIAGNOSIS — M9907 Segmental and somatic dysfunction of upper extremity: Secondary | ICD-10-CM | POA: Diagnosis not present

## 2021-01-05 DIAGNOSIS — M9903 Segmental and somatic dysfunction of lumbar region: Secondary | ICD-10-CM | POA: Diagnosis not present

## 2021-01-05 DIAGNOSIS — M4728 Other spondylosis with radiculopathy, sacral and sacrococcygeal region: Secondary | ICD-10-CM | POA: Diagnosis not present

## 2021-01-05 DIAGNOSIS — M25541 Pain in joints of right hand: Secondary | ICD-10-CM | POA: Diagnosis not present

## 2021-01-05 DIAGNOSIS — M4726 Other spondylosis with radiculopathy, lumbar region: Secondary | ICD-10-CM | POA: Diagnosis not present

## 2021-01-19 ENCOUNTER — Encounter: Payer: Self-pay | Admitting: Family Medicine

## 2021-01-19 ENCOUNTER — Other Ambulatory Visit: Payer: Self-pay | Admitting: Osteopathic Medicine

## 2021-01-21 ENCOUNTER — Other Ambulatory Visit: Payer: Self-pay | Admitting: Family Medicine

## 2021-01-21 MED ORDER — GABAPENTIN 100 MG PO CAPS: 100.0000 mg | ORAL_CAPSULE | Freq: Every day | ORAL | 0 refills | Status: DC

## 2021-02-01 ENCOUNTER — Telehealth: Payer: Self-pay | Admitting: Family Medicine

## 2021-02-01 ENCOUNTER — Other Ambulatory Visit: Payer: Self-pay | Admitting: Family Medicine

## 2021-02-01 DIAGNOSIS — B001 Herpesviral vesicular dermatitis: Secondary | ICD-10-CM

## 2021-02-01 MED ORDER — VALACYCLOVIR HCL 1 G PO TABS: ORAL_TABLET | ORAL | 1 refills | Status: DC

## 2021-02-01 NOTE — Telephone Encounter (Signed)
LVM for pt advising that this medication was sent to the pharmacy is September for a 90 day supply with one refill.  Therefore he should have enough medication to last him for 6 months.  Advised pt to contact his pharmacy.  Charyl Bigger, CMA

## 2021-02-01 NOTE — Telephone Encounter (Signed)
Pt called. Refill on his Valacyclovir. Appt scheduled for 12/7.

## 2021-02-26 DIAGNOSIS — N400 Enlarged prostate without lower urinary tract symptoms: Secondary | ICD-10-CM | POA: Diagnosis not present

## 2021-02-26 IMAGING — DX DG SHOULDER 2+V*R*
3 series · 3 of 3 positions shown · non-contrast
Comparison: No prior.

CLINICAL DATA: Rotator cuff pain.  History of repair.

EXAM:
RIGHT SHOULDER - 2+ VIEW

[shoulder grashey]
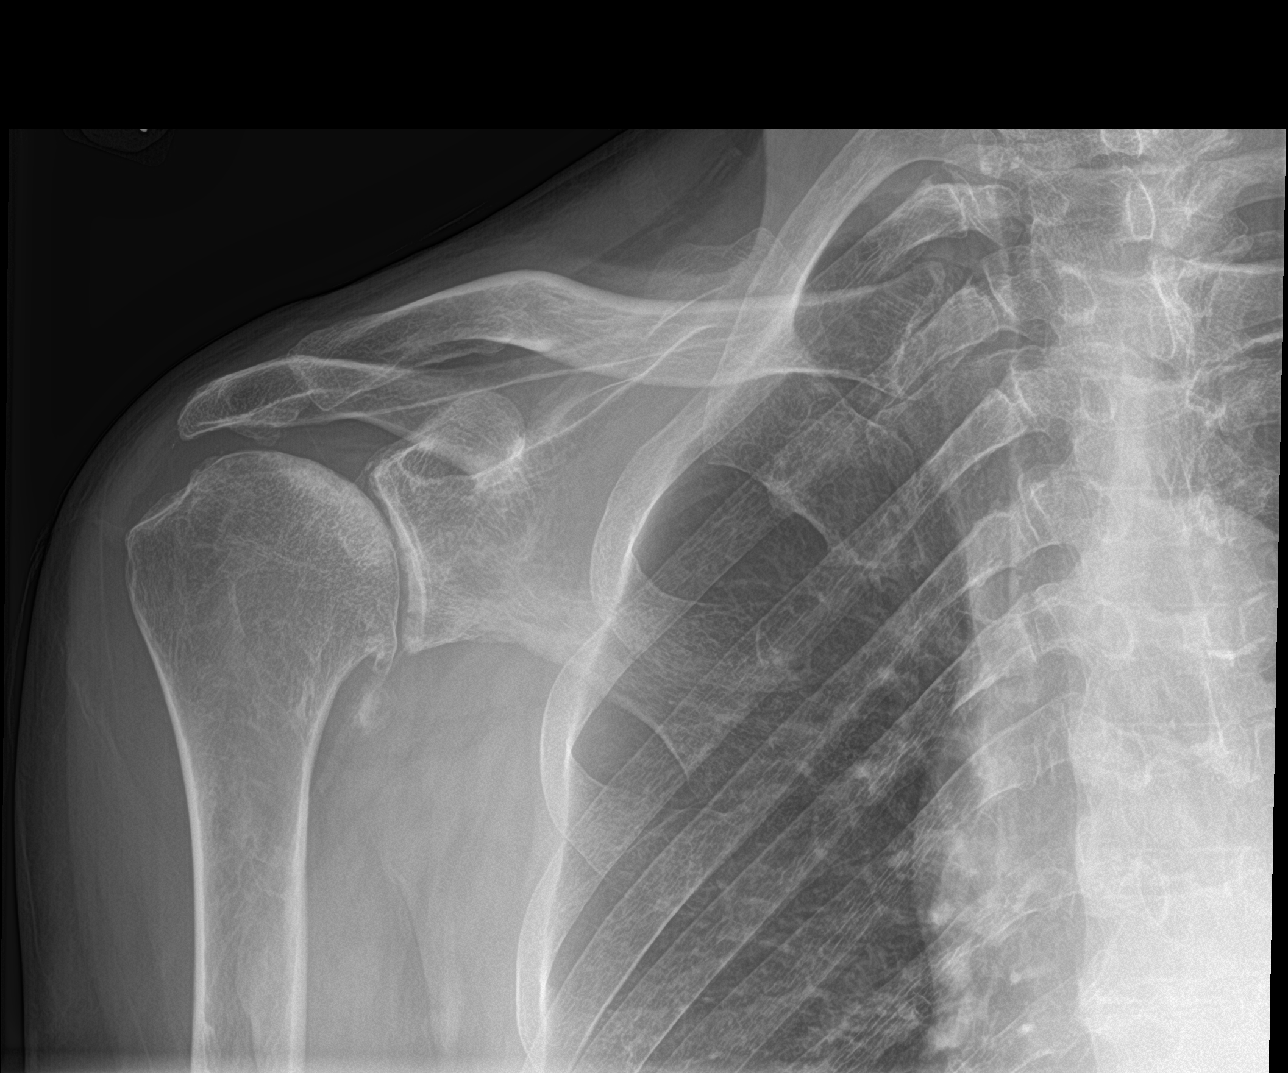

[shoulder y view]
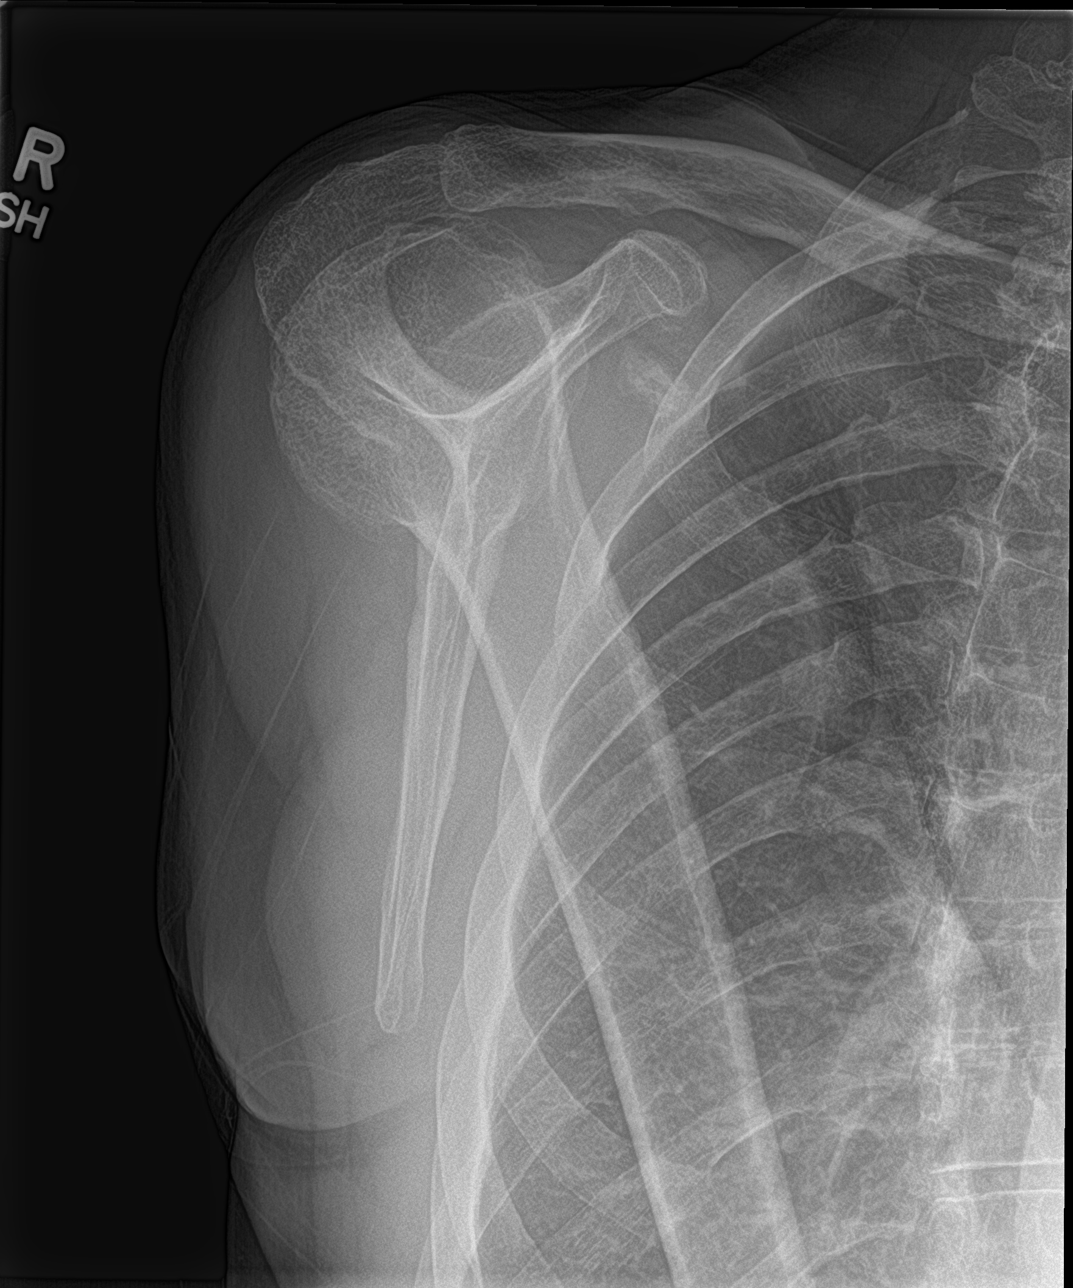

[shoulder axillary]
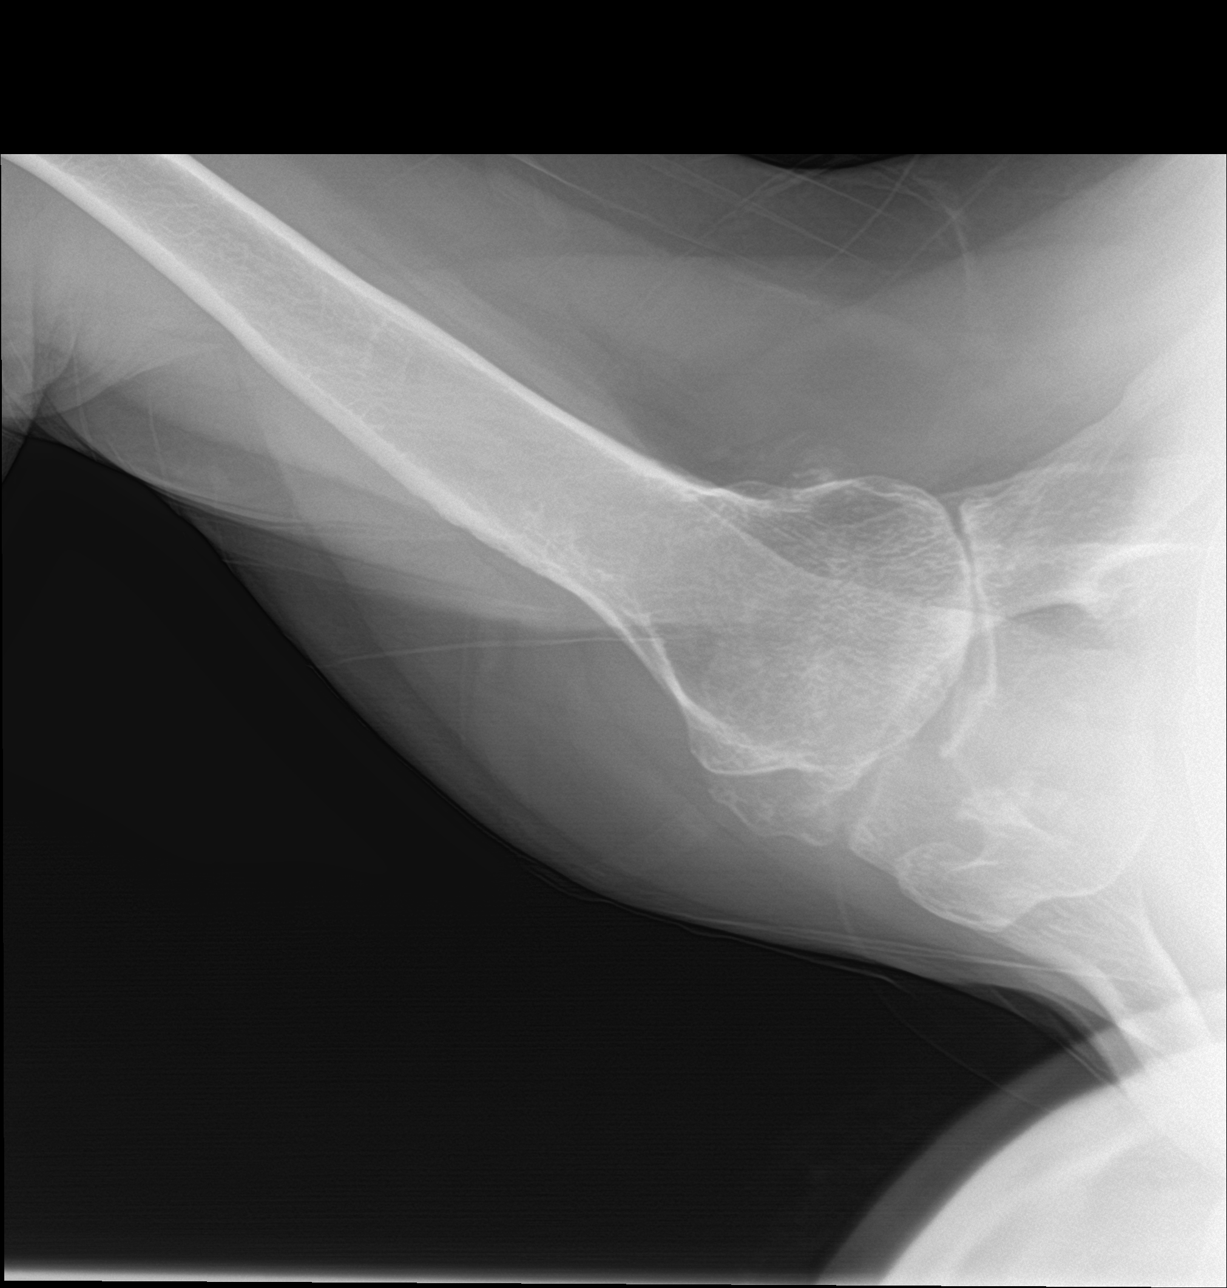

[3 of 3 positions shown; findings below may reference images not displayed]

FINDINGS: Acromioclavicular and glenohumeral degenerative change. Subacromial
spurring. Calcification noted the axilla most likely loose body.
Mild sclerotic changes noted the humeral head, these may be
degenerative. Avascular necrosis cannot be excluded. No acute
abnormality identified. No evidence of fracture or dislocation.
IMPRESSION: 1. Acromioclavicular and glenohumeral degenerative change.
Subacromial spurring. High-riding right shoulder. Rotator cuff tear
could present this fashion. Probable loose body.

2. Mild sclerotic changes noted the humeral head. These may be
degenerative avascular necrosis cannot be excluded.

## 2021-02-26 IMAGING — DX DG HIP (WITH OR WITHOUT PELVIS) 2V BILAT
2 series · 2 of 2 positions shown · non-contrast
Comparison: None.

CLINICAL DATA: Left hip pain, chronic low back pain with sciatica

EXAM:
DG HIP (WITH OR WITHOUT PELVIS) 2V BILAT

[pelvis ap]
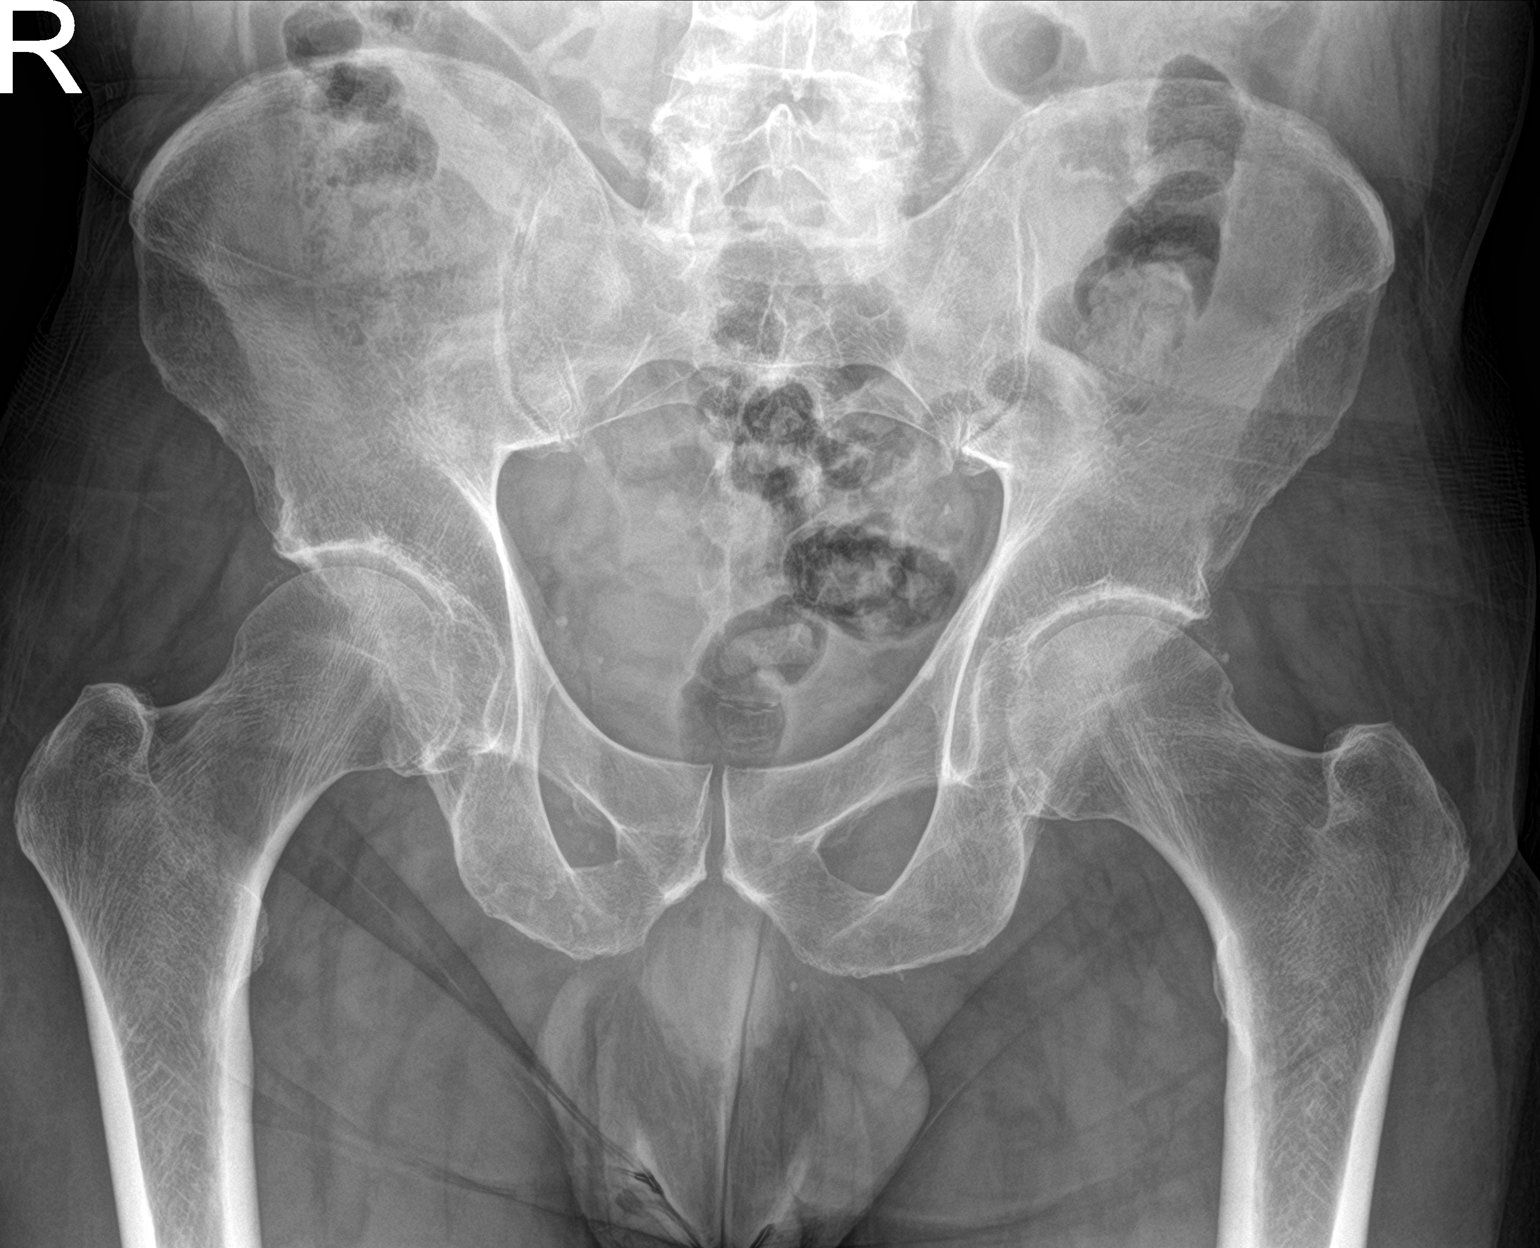

[hip lat]
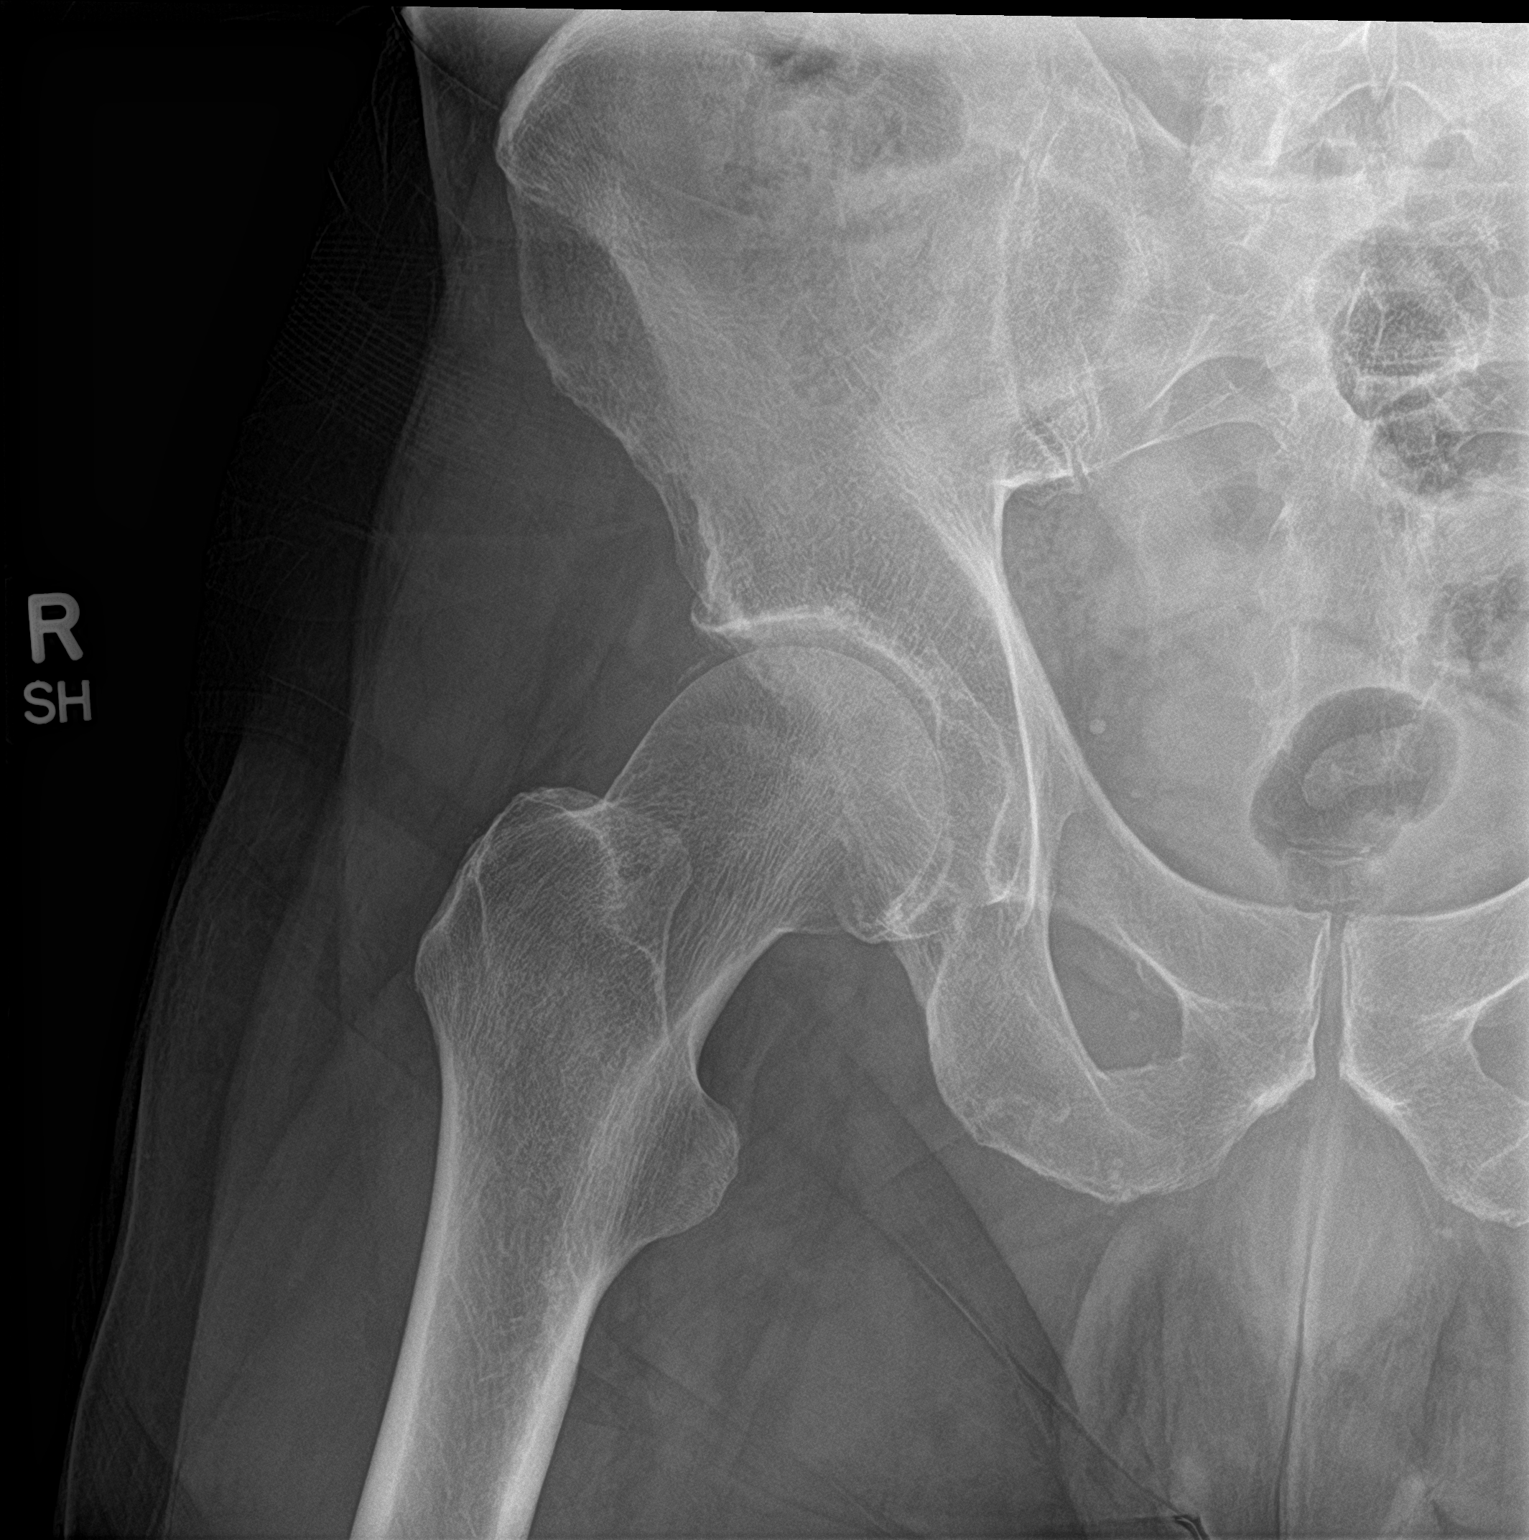

[2 of 2 positions shown; findings below may reference images not displayed]

FINDINGS: Mild symmetric degenerative changes in the hips with mild joint
space narrowing and spurring. No acute bony abnormality.
Specifically, no fracture, subluxation, or dislocation. SI joints
symmetric and unremarkable.
IMPRESSION: Mild symmetric degenerative changes in the hips. No acute bony
abnormality.

## 2021-02-26 IMAGING — DX DG SI JOINTS 3+V
3 series · 3 of 3 positions shown · non-contrast
Comparison: Left hip images performed today.

CLINICAL DATA: Bilateral hip pain.  No injury

EXAM:
BILATERAL SACROILIAC JOINTS - 3+ VIEW

[si joints ap]
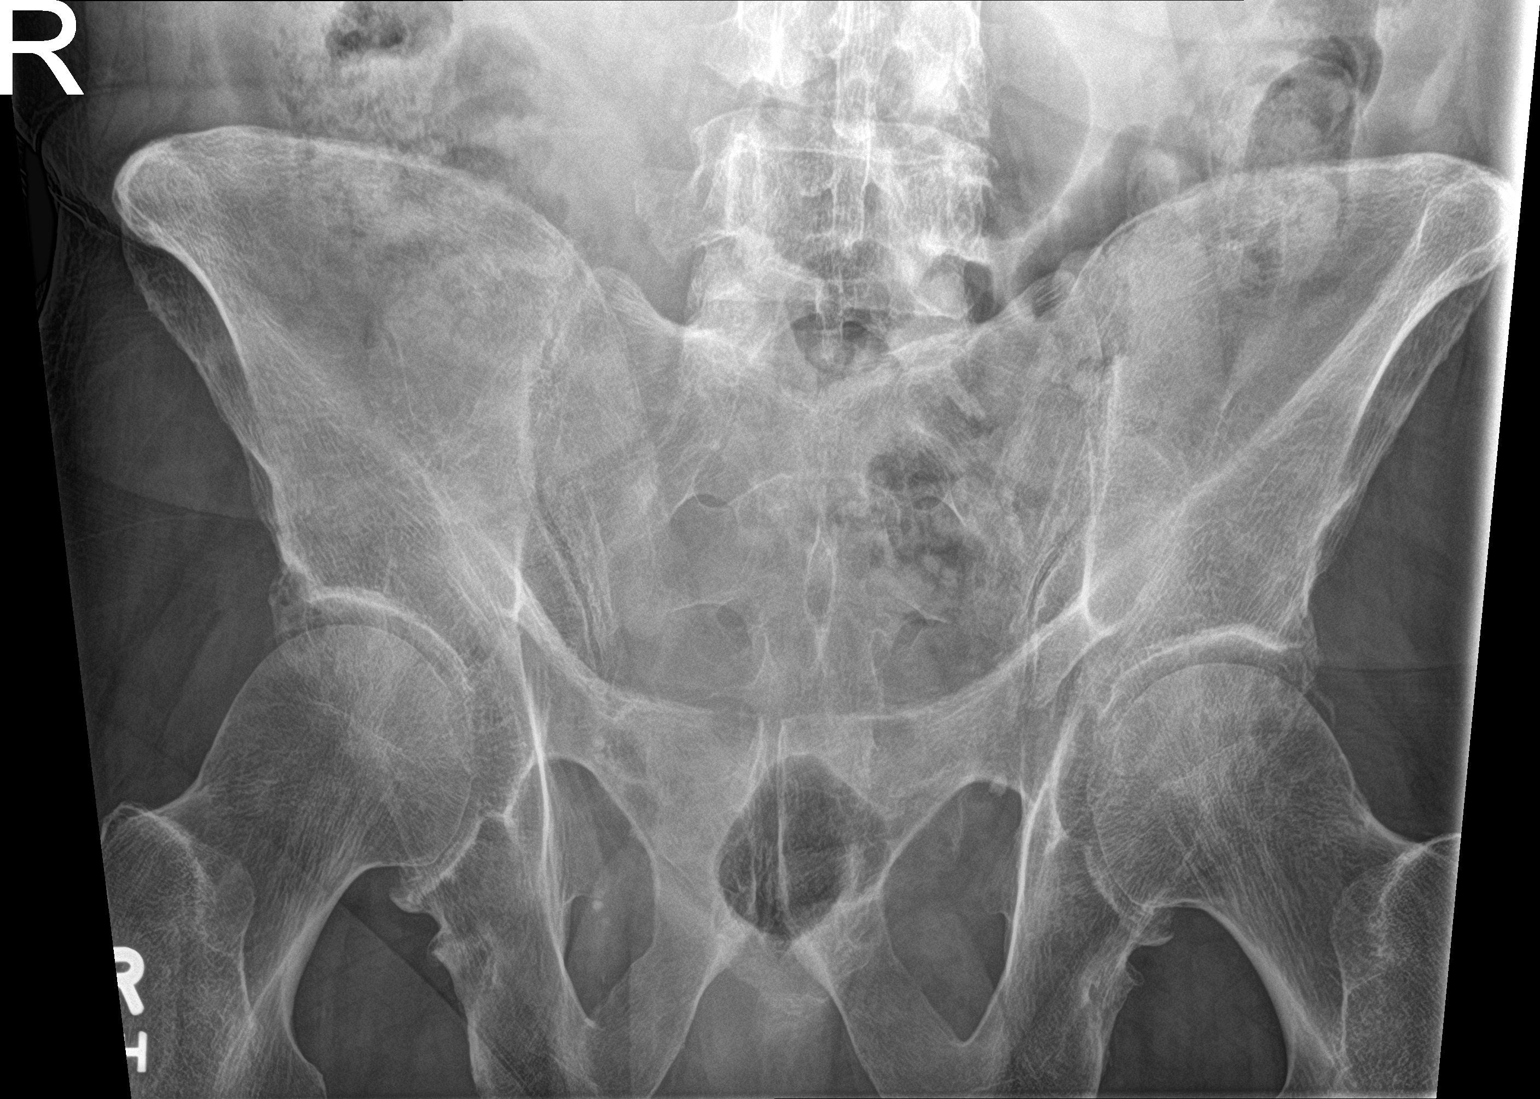

[si joints obl (1 of 2)]
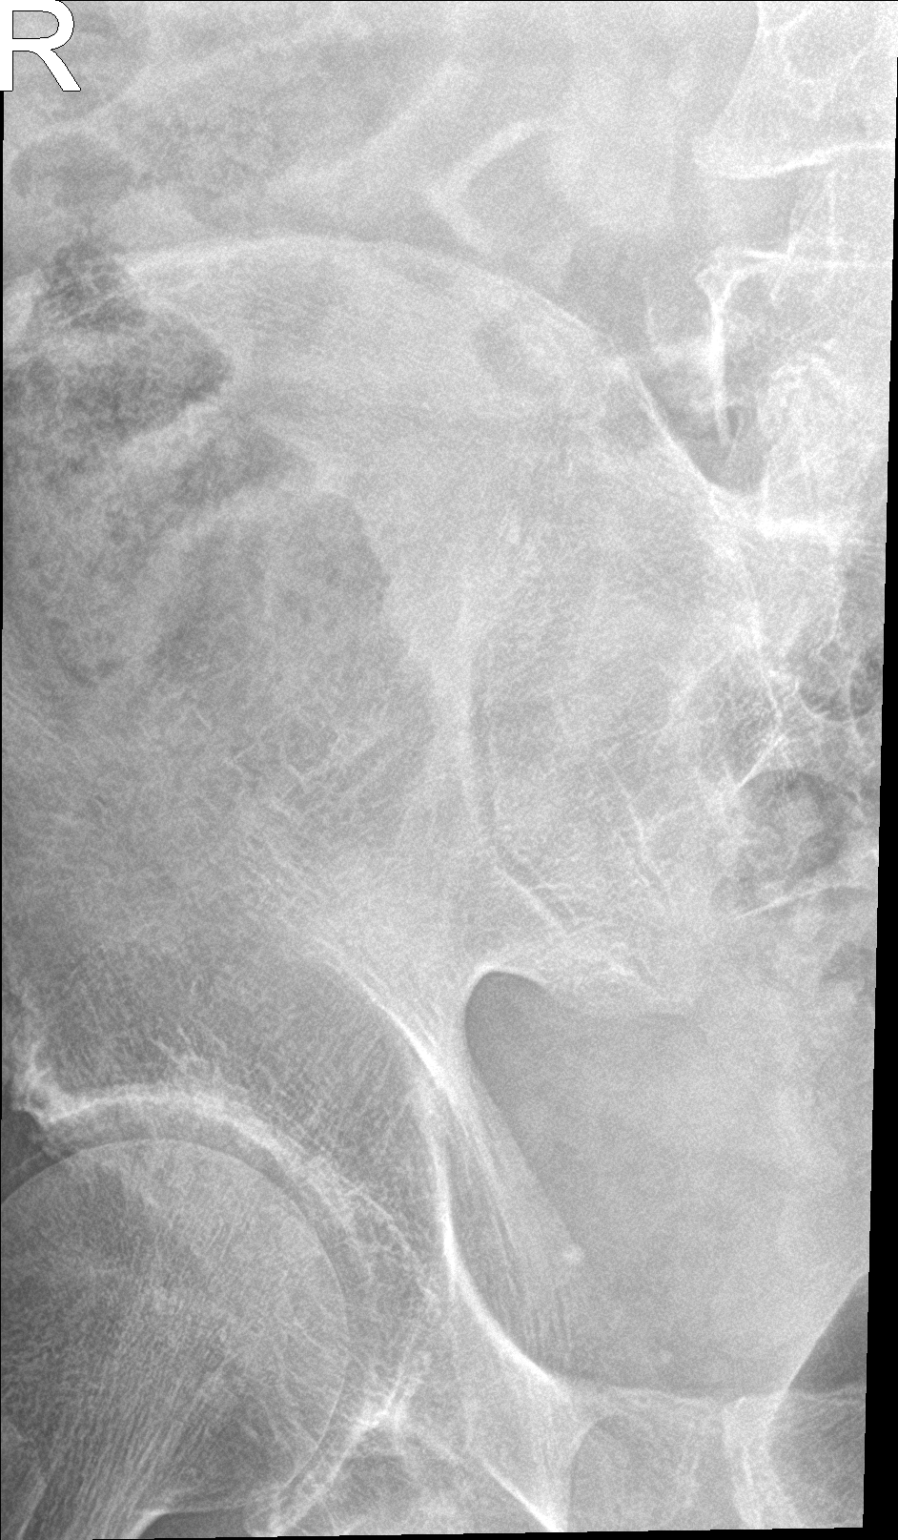

[si joints obl (2 of 2)]
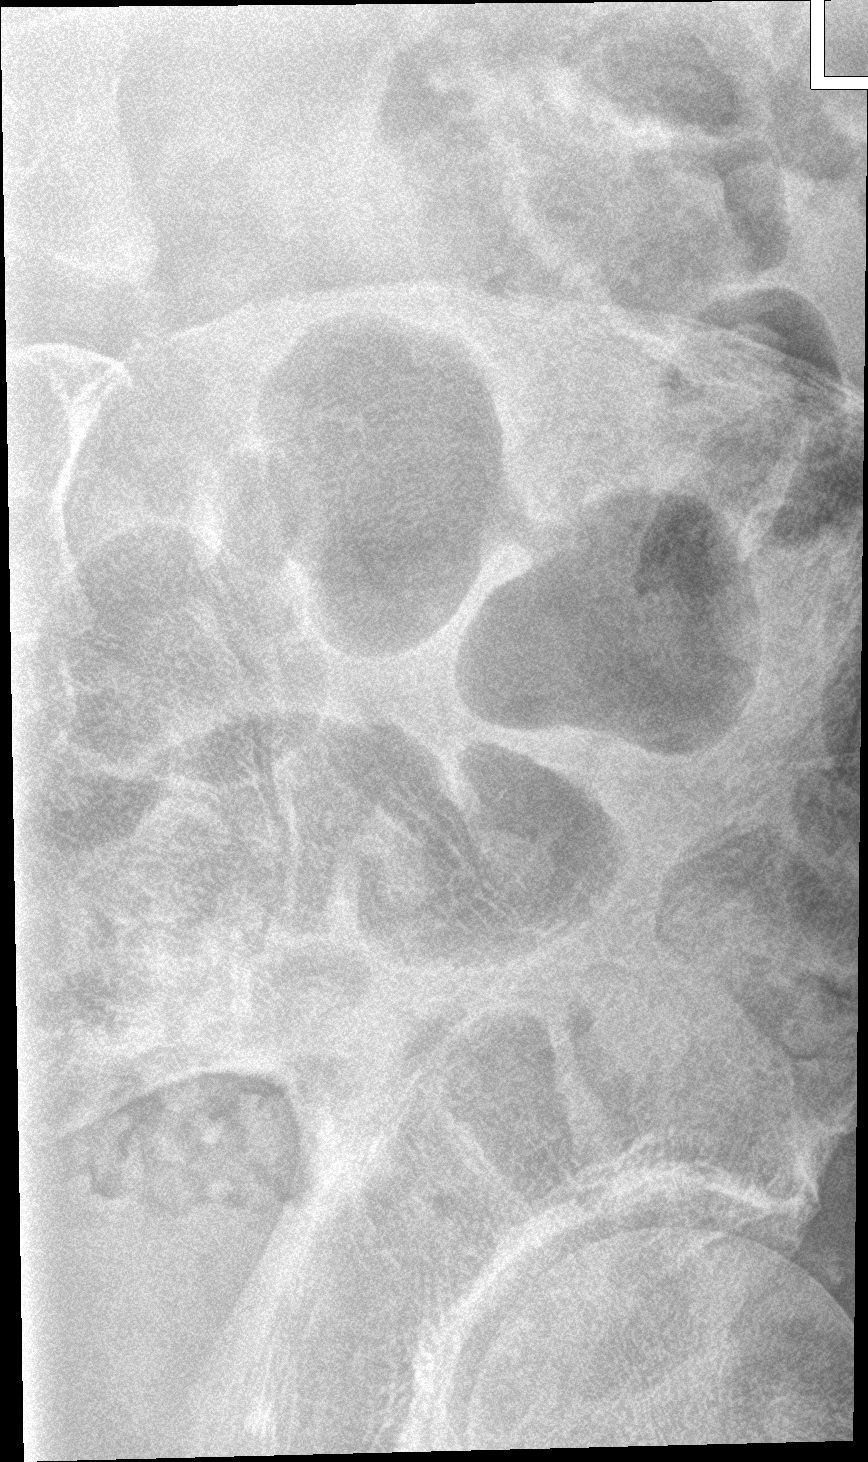

[3 of 3 positions shown; findings below may reference images not displayed]

FINDINGS: SI joints are symmetric and unremarkable. Mild degenerative changes
in the hips bilaterally with joint space narrowing and spurring. No
acute bony abnormality. Specifically, no fracture, subluxation, or
dislocation.
IMPRESSION: No acute bony abnormality.

## 2021-03-03 ENCOUNTER — Encounter: Payer: Self-pay | Admitting: Family Medicine

## 2021-03-03 ENCOUNTER — Other Ambulatory Visit: Payer: Self-pay

## 2021-03-03 ENCOUNTER — Ambulatory Visit (INDEPENDENT_AMBULATORY_CARE_PROVIDER_SITE_OTHER): Payer: Medicare Other | Admitting: Family Medicine

## 2021-03-03 VITALS — BP 142/79 | HR 76 | Temp 98.2°F | Ht 70.0 in | Wt 157.0 lb

## 2021-03-03 DIAGNOSIS — Z202 Contact with and (suspected) exposure to infections with a predominantly sexual mode of transmission: Secondary | ICD-10-CM | POA: Insufficient documentation

## 2021-03-03 DIAGNOSIS — I10 Essential (primary) hypertension: Secondary | ICD-10-CM

## 2021-03-03 DIAGNOSIS — N138 Other obstructive and reflux uropathy: Secondary | ICD-10-CM | POA: Diagnosis not present

## 2021-03-03 DIAGNOSIS — Z113 Encounter for screening for infections with a predominantly sexual mode of transmission: Secondary | ICD-10-CM

## 2021-03-03 DIAGNOSIS — B009 Herpesviral infection, unspecified: Secondary | ICD-10-CM

## 2021-03-03 DIAGNOSIS — N401 Enlarged prostate with lower urinary tract symptoms: Secondary | ICD-10-CM

## 2021-03-03 DIAGNOSIS — D7589 Other specified diseases of blood and blood-forming organs: Secondary | ICD-10-CM | POA: Diagnosis not present

## 2021-03-03 DIAGNOSIS — B001 Herpesviral vesicular dermatitis: Secondary | ICD-10-CM | POA: Diagnosis not present

## 2021-03-03 DIAGNOSIS — Z13220 Encounter for screening for lipoid disorders: Secondary | ICD-10-CM | POA: Diagnosis not present

## 2021-03-03 MED ORDER — GABAPENTIN 100 MG PO CAPS: 100.0000 mg | ORAL_CAPSULE | Freq: Every day | ORAL | 1 refills | Status: DC

## 2021-03-03 NOTE — Assessment & Plan Note (Signed)
No symptoms at this time however wishes to have screening.  Labs per orders.

## 2021-03-03 NOTE — Assessment & Plan Note (Signed)
Blood pressure is mildly elevated today.  Currently managing with diet and exercise.  Encouraged him to continue to monitor blood pressure at home.  Low-sodium diet encouraged.

## 2021-03-03 NOTE — Assessment & Plan Note (Signed)
Well-controlled with Valtrex.  We will continue at current strength.

## 2021-03-03 NOTE — Progress Notes (Signed)
He is Billy Landry - 70 y.o. male MRN [id number]  Date of birth: 12/07/1950  Subjective Chief Complaint  Patient presents with   Transitions Of Care    HPI Billy Landry is a 70 year old male here today for follow-up visit.  Former patient of Dr. Sheppard Coil.  He has been in fairly good health.  He has a history of hypertension which he has been controlling through diet and exercise.  Readings at home typically around 120/60-70.  He has discontinued his olmesartan approximately 6 months ago.    He does continue on gabapentin for management of restless legs.  This continues to work fairly well for him.  He is also taking Valtrex for HSV.  ROS:  A comprehensive ROS was completed and negative except as noted per HPI  Allergies  Allergen Reactions   Bee Venom Anaphylaxis and Swelling    Past Medical History:  Diagnosis Date   Allergy    Arthritis    hands, hips   BPH associated with nocturia    DJD of right shoulder    Essential hypertension 01/05/2015   Shingles 12/2017    Past Surgical History:  Procedure Laterality Date   CATARACT EXTRACTION Left 2013   RETINAL DETACHMENT SURGERY  2012   REVERSE SHOULDER ARTHROPLASTY Right 04/24/2019   Procedure: RIGHT REVERSE SHOULDER ARTHROPLASTY;  Surgeon: Hiram Gash, MD;  Location: [facility];  Service: Orthopedics;  Laterality: Right;   ROTATOR CUFF REPAIR Right 2010   TONSILECTOMY, ADENOIDECTOMY, BILATERAL MYRINGOTOMY AND TUBES  1968   VASECTOMY      Social History   Socioeconomic History   Marital status: Single    Spouse name: Not on file   Number of children: 3   Years of education: 16   Highest education level: Bachelor's degree (e.g., BA, AB, BS)  Occupational History   Occupation: retired    Comment: communications  Tobacco Use   Smoking status: Never   Smokeless tobacco: Never  Vaping Use   Vaping Use: Never used  Substance and Sexual Activity   Alcohol use: Yes    Alcohol/week: 2.0 standard drinks     Types: 2 Cans of beer per week    Comment: occ   Drug use: No   Sexual activity: Yes  Other Topics Concern   Not on file  Social History Narrative   Lives alone. He has three children and four grand children. Did work for [employer]. He enjoys golfing, gardening and feeding birds and squirrels.    Social Determinants of Health   Financial Resource Strain: Low Risk    Difficulty of Paying Living Expenses: Not hard at all  Food Insecurity: No Food Insecurity   Worried About Charity fundraiser in the Last Year: Never true   [place]rost in the Last Year: Never true  Transportation Needs: No Transportation Needs   Lack of Transportation (Medical): No   Lack of Transportation (Non-Medical): No  Physical Activity: Sufficiently Active   Days of Exercise per Week: 7 days   Minutes of Exercise per Session: 30 min  Stress: No Stress Concern Present   Feeling of Stress : Not at all  Social Connections: Moderately Isolated   Frequency of Communication with Friends and Family: More than three times a week   Frequency of Social Gatherings with Friends and Family: Twice a week   Attends Religious Services: 1 to 4 times per year   Active Member of Clubs or Organizations: No   Attends  Club or Organization Meetings: Never   Marital Status: Divorced    Family History  Problem Relation Age of Onset   Cancer Mother    Diabetes Father    Stroke Father    Stroke Brother     Health Maintenance  Topic Date Due   COLONOSCOPY (Pts 45-58yrs Insurance coverage will need to be confirmed)  03/11/2021 (Originally 03/29/2019)   COVID-19 Vaccine (1) 03/19/2021 (Originally 01/13/1951)   Zoster Vaccines- Shingrix (1 of 2) 06/01/2021 (Originally 07/14/1968)   INFLUENZA VACCINE  06/25/2021 (Originally 10/26/2020)   Pneumonia Vaccine 9+ Years old (1 - PCV) 03/03/2022 (Originally 07/15/1955)   TETANUS/TDAP  10/25/2025   Hepatitis C Screening  Completed   HPV VACCINES  Aged Out      ----------------------------------------------------------------------------------------------------------------------------------------------------------------------------------------------------------------- Physical Exam BP (!) 142/79 (BP Location: Left Arm, Patient Position: Sitting, Cuff Size: Normal)   Pulse 76   Temp 98.2 F (36.8 C) (Oral)   Ht 5\' 10"  (1.778 m)   Wt 157 lb 0.6 oz (71.2 kg)   SpO2 98%   BMI 22.53 kg/m   Physical Exam Constitutional:      Appearance: Normal appearance.  Musculoskeletal:     Cervical back: Neck supple.  Neurological:     Mental Status: He is alert.  Psychiatric:        Mood and Affect: Mood normal.        Behavior: Behavior normal.    ------------------------------------------------------------------------------------------------------------------------------------------------------------------------------------------------------------------- Assessment and Plan  Essential hypertension Blood pressure is mildly elevated today.  Currently managing with diet and exercise.  Encouraged him to continue to monitor blood pressure at home.  Low-sodium diet encouraged.  HSV infection Well-controlled with Valtrex.  We will continue at current strength.  Possible exposure to STD No symptoms at this time however wishes to have screening.  Labs per orders.   Meds ordered this encounter  Medications   gabapentin (NEURONTIN) 100 MG capsule    Sig: Take 1-3 capsules (100-300 mg total) by mouth at bedtime.    Dispense:  270 capsule    Refill:  1    Return in about 6 months (around 09/01/2021) for HTN.    This visit occurred during the SARS-CoV-2 public health emergency.  Safety protocols were in place, including screening questions prior to the visit, additional usage of staff PPE, and extensive cleaning of exam room while observing appropriate contact time as indicated for disinfecting solutions.

## 2021-03-05 DIAGNOSIS — Z13220 Encounter for screening for lipoid disorders: Secondary | ICD-10-CM | POA: Diagnosis not present

## 2021-03-05 DIAGNOSIS — Z113 Encounter for screening for infections with a predominantly sexual mode of transmission: Secondary | ICD-10-CM | POA: Diagnosis not present

## 2021-03-05 DIAGNOSIS — I10 Essential (primary) hypertension: Secondary | ICD-10-CM | POA: Diagnosis not present

## 2021-03-10 LAB — CHLAMYDIA/NEISSERIA GONORRHOEAE RNA,TMA,UROGENTIAL
C. trachomatis RNA, TMA: NOT DETECTED
N. gonorrhoeae RNA, TMA: NOT DETECTED

## 2021-03-10 LAB — COMPLETE METABOLIC PANEL WITH GFR
AG Ratio: 2 (calc) (ref 1.0–2.5)
ALT: 27 U/L (ref 9–46)
AST: 27 U/L (ref 10–35)
Albumin: 4.1 g/dL (ref 3.6–5.1)
Alkaline phosphatase (APISO): 66 U/L (ref 35–144)
BUN/Creatinine Ratio: 38 (calc) — ABNORMAL HIGH (ref 6–22)
BUN: 34 mg/dL — ABNORMAL HIGH (ref 7–25)
CO2: 28 mmol/L (ref 20–32)
Calcium: 9.5 mg/dL (ref 8.6–10.3)
Chloride: 104 mmol/L (ref 98–110)
Creat: 0.89 mg/dL (ref 0.70–1.28)
Globulin: 2.1 g/dL (calc) (ref 1.9–3.7)
Glucose, Bld: 93 mg/dL (ref 65–139)
Potassium: 4.3 mmol/L (ref 3.5–5.3)
Sodium: 138 mmol/L (ref 135–146)
Total Bilirubin: 1 mg/dL (ref 0.2–1.2)
Total Protein: 6.2 g/dL (ref 6.1–8.1)
eGFR: 92 mL/min/{1.73_m2} (ref >=60)

## 2021-03-10 LAB — TSH: TSH: 1.33 mIU/L (ref 0.40–4.50)

## 2021-03-10 LAB — CBC WITH DIFFERENTIAL/PLATELET
Absolute Monocytes: 622 cells/uL (ref 200–950)
Basophils Absolute: 41 cells/uL (ref 0–200)
Basophils Relative: 0.8 %
Eosinophils Absolute: 179 cells/uL (ref 15–500)
Eosinophils Relative: 3.5 %
HCT: 39.5 % (ref 38.5–50.0)
Hemoglobin: 13.5 g/dL (ref 13.2–17.1)
Lymphs Abs: 1530 cells/uL (ref 850–3900)
MCH: 36.3 pg — ABNORMAL HIGH (ref 27.0–33.0)
MCHC: 34.2 g/dL (ref 32.0–36.0)
MCV: 106.2 fL — ABNORMAL HIGH (ref 80.0–100.0)
MPV: 12.3 fL (ref 7.5–12.5)
Monocytes Relative: 12.2 %
Neutro Abs: 2729 cells/uL (ref 1500–7800)
Neutrophils Relative %: 53.5 %
Platelets: 129 10*3/uL — ABNORMAL LOW (ref 140–400)
RBC: 3.72 10*6/uL — ABNORMAL LOW (ref 4.20–5.80)
RDW: 12.2 % (ref 11.0–15.0)
Total Lymphocyte: 30 %
WBC: 5.1 10*3/uL (ref 3.8–10.8)

## 2021-03-10 LAB — LIPID PANEL W/REFLEX DIRECT LDL
Cholesterol: 173 mg/dL (ref <200)
HDL: 50 mg/dL (ref >=40)
LDL Cholesterol (Calc): 106 mg/dL (calc) — ABNORMAL HIGH
Non-HDL Cholesterol (Calc): 123 mg/dL (calc) (ref <130)
Total CHOL/HDL Ratio: 3.5 (calc) (ref <5.0)
Triglycerides: 80 mg/dL (ref <150)

## 2021-03-10 LAB — HEPATITIS C ANTIBODY
Hepatitis C Ab: NONREACTIVE
SIGNAL TO CUT-OFF: 0.04 (ref <1.00)

## 2021-03-10 LAB — RPR: RPR Ser Ql: NONREACTIVE

## 2021-03-10 LAB — TRICHOMONAS VAGINALIS, PROBE AMP: Trichomonas vaginalis RNA: NOT DETECTED

## 2021-04-07 DIAGNOSIS — M4721 Other spondylosis with radiculopathy, occipito-atlanto-axial region: Secondary | ICD-10-CM | POA: Diagnosis not present

## 2021-04-07 DIAGNOSIS — M9901 Segmental and somatic dysfunction of cervical region: Secondary | ICD-10-CM | POA: Diagnosis not present

## 2021-04-07 DIAGNOSIS — M4728 Other spondylosis with radiculopathy, sacral and sacrococcygeal region: Secondary | ICD-10-CM | POA: Diagnosis not present

## 2021-04-07 DIAGNOSIS — M9904 Segmental and somatic dysfunction of sacral region: Secondary | ICD-10-CM | POA: Diagnosis not present

## 2021-04-07 DIAGNOSIS — M9903 Segmental and somatic dysfunction of lumbar region: Secondary | ICD-10-CM | POA: Diagnosis not present

## 2021-04-07 DIAGNOSIS — M9907 Segmental and somatic dysfunction of upper extremity: Secondary | ICD-10-CM | POA: Diagnosis not present

## 2021-04-07 DIAGNOSIS — M25532 Pain in left wrist: Secondary | ICD-10-CM | POA: Diagnosis not present

## 2021-04-07 DIAGNOSIS — M4726 Other spondylosis with radiculopathy, lumbar region: Secondary | ICD-10-CM | POA: Diagnosis not present

## 2021-04-07 DIAGNOSIS — M25541 Pain in joints of right hand: Secondary | ICD-10-CM | POA: Diagnosis not present

## 2021-04-14 DIAGNOSIS — M4721 Other spondylosis with radiculopathy, occipito-atlanto-axial region: Secondary | ICD-10-CM | POA: Diagnosis not present

## 2021-04-14 DIAGNOSIS — M25532 Pain in left wrist: Secondary | ICD-10-CM | POA: Diagnosis not present

## 2021-04-14 DIAGNOSIS — M4726 Other spondylosis with radiculopathy, lumbar region: Secondary | ICD-10-CM | POA: Diagnosis not present

## 2021-04-14 DIAGNOSIS — M25541 Pain in joints of right hand: Secondary | ICD-10-CM | POA: Diagnosis not present

## 2021-04-14 DIAGNOSIS — M9903 Segmental and somatic dysfunction of lumbar region: Secondary | ICD-10-CM | POA: Diagnosis not present

## 2021-04-14 DIAGNOSIS — M9904 Segmental and somatic dysfunction of sacral region: Secondary | ICD-10-CM | POA: Diagnosis not present

## 2021-04-14 DIAGNOSIS — M4728 Other spondylosis with radiculopathy, sacral and sacrococcygeal region: Secondary | ICD-10-CM | POA: Diagnosis not present

## 2021-04-14 DIAGNOSIS — M9901 Segmental and somatic dysfunction of cervical region: Secondary | ICD-10-CM | POA: Diagnosis not present

## 2021-04-14 DIAGNOSIS — M9907 Segmental and somatic dysfunction of upper extremity: Secondary | ICD-10-CM | POA: Diagnosis not present

## 2021-04-21 DIAGNOSIS — M9907 Segmental and somatic dysfunction of upper extremity: Secondary | ICD-10-CM | POA: Diagnosis not present

## 2021-04-21 DIAGNOSIS — M25532 Pain in left wrist: Secondary | ICD-10-CM | POA: Diagnosis not present

## 2021-04-21 DIAGNOSIS — M25541 Pain in joints of right hand: Secondary | ICD-10-CM | POA: Diagnosis not present

## 2021-04-21 DIAGNOSIS — M9903 Segmental and somatic dysfunction of lumbar region: Secondary | ICD-10-CM | POA: Diagnosis not present

## 2021-04-21 DIAGNOSIS — M4726 Other spondylosis with radiculopathy, lumbar region: Secondary | ICD-10-CM | POA: Diagnosis not present

## 2021-04-21 DIAGNOSIS — M9904 Segmental and somatic dysfunction of sacral region: Secondary | ICD-10-CM | POA: Diagnosis not present

## 2021-04-21 DIAGNOSIS — M4728 Other spondylosis with radiculopathy, sacral and sacrococcygeal region: Secondary | ICD-10-CM | POA: Diagnosis not present

## 2021-04-21 DIAGNOSIS — M4721 Other spondylosis with radiculopathy, occipito-atlanto-axial region: Secondary | ICD-10-CM | POA: Diagnosis not present

## 2021-04-21 DIAGNOSIS — M9901 Segmental and somatic dysfunction of cervical region: Secondary | ICD-10-CM | POA: Diagnosis not present

## 2021-04-28 DIAGNOSIS — M9904 Segmental and somatic dysfunction of sacral region: Secondary | ICD-10-CM | POA: Diagnosis not present

## 2021-04-28 DIAGNOSIS — M4726 Other spondylosis with radiculopathy, lumbar region: Secondary | ICD-10-CM | POA: Diagnosis not present

## 2021-04-28 DIAGNOSIS — M25532 Pain in left wrist: Secondary | ICD-10-CM | POA: Diagnosis not present

## 2021-04-28 DIAGNOSIS — M9907 Segmental and somatic dysfunction of upper extremity: Secondary | ICD-10-CM | POA: Diagnosis not present

## 2021-04-28 DIAGNOSIS — M9901 Segmental and somatic dysfunction of cervical region: Secondary | ICD-10-CM | POA: Diagnosis not present

## 2021-04-28 DIAGNOSIS — M4721 Other spondylosis with radiculopathy, occipito-atlanto-axial region: Secondary | ICD-10-CM | POA: Diagnosis not present

## 2021-04-28 DIAGNOSIS — M25541 Pain in joints of right hand: Secondary | ICD-10-CM | POA: Diagnosis not present

## 2021-04-28 DIAGNOSIS — M9903 Segmental and somatic dysfunction of lumbar region: Secondary | ICD-10-CM | POA: Diagnosis not present

## 2021-04-28 DIAGNOSIS — M4728 Other spondylosis with radiculopathy, sacral and sacrococcygeal region: Secondary | ICD-10-CM | POA: Diagnosis not present

## 2021-04-30 DIAGNOSIS — L821 Other seborrheic keratosis: Secondary | ICD-10-CM | POA: Diagnosis not present

## 2021-04-30 DIAGNOSIS — B078 Other viral warts: Secondary | ICD-10-CM | POA: Diagnosis not present

## 2021-04-30 DIAGNOSIS — Z85828 Personal history of other malignant neoplasm of skin: Secondary | ICD-10-CM | POA: Diagnosis not present

## 2021-04-30 DIAGNOSIS — D229 Melanocytic nevi, unspecified: Secondary | ICD-10-CM | POA: Diagnosis not present

## 2021-04-30 DIAGNOSIS — L570 Actinic keratosis: Secondary | ICD-10-CM | POA: Diagnosis not present

## 2021-05-05 DIAGNOSIS — M4728 Other spondylosis with radiculopathy, sacral and sacrococcygeal region: Secondary | ICD-10-CM | POA: Diagnosis not present

## 2021-05-05 DIAGNOSIS — M4721 Other spondylosis with radiculopathy, occipito-atlanto-axial region: Secondary | ICD-10-CM | POA: Diagnosis not present

## 2021-05-05 DIAGNOSIS — M9901 Segmental and somatic dysfunction of cervical region: Secondary | ICD-10-CM | POA: Diagnosis not present

## 2021-05-05 DIAGNOSIS — M25541 Pain in joints of right hand: Secondary | ICD-10-CM | POA: Diagnosis not present

## 2021-05-05 DIAGNOSIS — M4726 Other spondylosis with radiculopathy, lumbar region: Secondary | ICD-10-CM | POA: Diagnosis not present

## 2021-05-05 DIAGNOSIS — M9907 Segmental and somatic dysfunction of upper extremity: Secondary | ICD-10-CM | POA: Diagnosis not present

## 2021-05-05 DIAGNOSIS — M25532 Pain in left wrist: Secondary | ICD-10-CM | POA: Diagnosis not present

## 2021-05-05 DIAGNOSIS — M9904 Segmental and somatic dysfunction of sacral region: Secondary | ICD-10-CM | POA: Diagnosis not present

## 2021-05-05 DIAGNOSIS — M9903 Segmental and somatic dysfunction of lumbar region: Secondary | ICD-10-CM | POA: Diagnosis not present

## 2021-05-17 DIAGNOSIS — M25541 Pain in joints of right hand: Secondary | ICD-10-CM | POA: Diagnosis not present

## 2021-05-17 DIAGNOSIS — M4726 Other spondylosis with radiculopathy, lumbar region: Secondary | ICD-10-CM | POA: Diagnosis not present

## 2021-05-17 DIAGNOSIS — M9901 Segmental and somatic dysfunction of cervical region: Secondary | ICD-10-CM | POA: Diagnosis not present

## 2021-05-17 DIAGNOSIS — M9903 Segmental and somatic dysfunction of lumbar region: Secondary | ICD-10-CM | POA: Diagnosis not present

## 2021-05-17 DIAGNOSIS — M9907 Segmental and somatic dysfunction of upper extremity: Secondary | ICD-10-CM | POA: Diagnosis not present

## 2021-05-17 DIAGNOSIS — M4728 Other spondylosis with radiculopathy, sacral and sacrococcygeal region: Secondary | ICD-10-CM | POA: Diagnosis not present

## 2021-05-17 DIAGNOSIS — M25532 Pain in left wrist: Secondary | ICD-10-CM | POA: Diagnosis not present

## 2021-05-17 DIAGNOSIS — M9904 Segmental and somatic dysfunction of sacral region: Secondary | ICD-10-CM | POA: Diagnosis not present

## 2021-05-17 DIAGNOSIS — M4721 Other spondylosis with radiculopathy, occipito-atlanto-axial region: Secondary | ICD-10-CM | POA: Diagnosis not present

## 2021-05-25 DIAGNOSIS — M4726 Other spondylosis with radiculopathy, lumbar region: Secondary | ICD-10-CM | POA: Diagnosis not present

## 2021-05-25 DIAGNOSIS — M9904 Segmental and somatic dysfunction of sacral region: Secondary | ICD-10-CM | POA: Diagnosis not present

## 2021-05-25 DIAGNOSIS — M9901 Segmental and somatic dysfunction of cervical region: Secondary | ICD-10-CM | POA: Diagnosis not present

## 2021-05-25 DIAGNOSIS — M4728 Other spondylosis with radiculopathy, sacral and sacrococcygeal region: Secondary | ICD-10-CM | POA: Diagnosis not present

## 2021-05-25 DIAGNOSIS — M25541 Pain in joints of right hand: Secondary | ICD-10-CM | POA: Diagnosis not present

## 2021-05-25 DIAGNOSIS — M9907 Segmental and somatic dysfunction of upper extremity: Secondary | ICD-10-CM | POA: Diagnosis not present

## 2021-05-25 DIAGNOSIS — M9903 Segmental and somatic dysfunction of lumbar region: Secondary | ICD-10-CM | POA: Diagnosis not present

## 2021-05-25 DIAGNOSIS — M25532 Pain in left wrist: Secondary | ICD-10-CM | POA: Diagnosis not present

## 2021-05-25 DIAGNOSIS — M4721 Other spondylosis with radiculopathy, occipito-atlanto-axial region: Secondary | ICD-10-CM | POA: Diagnosis not present

## 2021-06-08 DIAGNOSIS — M9903 Segmental and somatic dysfunction of lumbar region: Secondary | ICD-10-CM | POA: Diagnosis not present

## 2021-06-08 DIAGNOSIS — M4721 Other spondylosis with radiculopathy, occipito-atlanto-axial region: Secondary | ICD-10-CM | POA: Diagnosis not present

## 2021-06-08 DIAGNOSIS — M4726 Other spondylosis with radiculopathy, lumbar region: Secondary | ICD-10-CM | POA: Diagnosis not present

## 2021-06-08 DIAGNOSIS — M9904 Segmental and somatic dysfunction of sacral region: Secondary | ICD-10-CM | POA: Diagnosis not present

## 2021-06-08 DIAGNOSIS — M25532 Pain in left wrist: Secondary | ICD-10-CM | POA: Diagnosis not present

## 2021-06-08 DIAGNOSIS — M9901 Segmental and somatic dysfunction of cervical region: Secondary | ICD-10-CM | POA: Diagnosis not present

## 2021-06-08 DIAGNOSIS — M9907 Segmental and somatic dysfunction of upper extremity: Secondary | ICD-10-CM | POA: Diagnosis not present

## 2021-06-08 DIAGNOSIS — M25541 Pain in joints of right hand: Secondary | ICD-10-CM | POA: Diagnosis not present

## 2021-06-08 DIAGNOSIS — M4728 Other spondylosis with radiculopathy, sacral and sacrococcygeal region: Secondary | ICD-10-CM | POA: Diagnosis not present

## 2021-06-10 ENCOUNTER — Other Ambulatory Visit: Payer: Self-pay

## 2021-06-10 MED ORDER — VALACYCLOVIR HCL 1 G PO TABS: ORAL_TABLET | ORAL | 1 refills | Status: DC

## 2021-06-10 MED ORDER — GABAPENTIN 100 MG PO CAPS: 100.0000 mg | ORAL_CAPSULE | Freq: Every day | ORAL | 1 refills | Status: DC

## 2021-06-15 DIAGNOSIS — M4721 Other spondylosis with radiculopathy, occipito-atlanto-axial region: Secondary | ICD-10-CM | POA: Diagnosis not present

## 2021-06-15 DIAGNOSIS — M9904 Segmental and somatic dysfunction of sacral region: Secondary | ICD-10-CM | POA: Diagnosis not present

## 2021-06-15 DIAGNOSIS — M25532 Pain in left wrist: Secondary | ICD-10-CM | POA: Diagnosis not present

## 2021-06-15 DIAGNOSIS — M9907 Segmental and somatic dysfunction of upper extremity: Secondary | ICD-10-CM | POA: Diagnosis not present

## 2021-06-15 DIAGNOSIS — M25541 Pain in joints of right hand: Secondary | ICD-10-CM | POA: Diagnosis not present

## 2021-06-15 DIAGNOSIS — M4726 Other spondylosis with radiculopathy, lumbar region: Secondary | ICD-10-CM | POA: Diagnosis not present

## 2021-06-15 DIAGNOSIS — M9901 Segmental and somatic dysfunction of cervical region: Secondary | ICD-10-CM | POA: Diagnosis not present

## 2021-06-15 DIAGNOSIS — M9903 Segmental and somatic dysfunction of lumbar region: Secondary | ICD-10-CM | POA: Diagnosis not present

## 2021-06-15 DIAGNOSIS — M4728 Other spondylosis with radiculopathy, sacral and sacrococcygeal region: Secondary | ICD-10-CM | POA: Diagnosis not present

## 2021-06-20 ENCOUNTER — Other Ambulatory Visit: Payer: Self-pay | Admitting: Osteopathic Medicine

## 2021-06-22 DIAGNOSIS — M25532 Pain in left wrist: Secondary | ICD-10-CM | POA: Diagnosis not present

## 2021-06-22 DIAGNOSIS — M25541 Pain in joints of right hand: Secondary | ICD-10-CM | POA: Diagnosis not present

## 2021-06-22 DIAGNOSIS — M9907 Segmental and somatic dysfunction of upper extremity: Secondary | ICD-10-CM | POA: Diagnosis not present

## 2021-06-22 DIAGNOSIS — M4726 Other spondylosis with radiculopathy, lumbar region: Secondary | ICD-10-CM | POA: Diagnosis not present

## 2021-06-22 DIAGNOSIS — M9904 Segmental and somatic dysfunction of sacral region: Secondary | ICD-10-CM | POA: Diagnosis not present

## 2021-06-22 DIAGNOSIS — M9903 Segmental and somatic dysfunction of lumbar region: Secondary | ICD-10-CM | POA: Diagnosis not present

## 2021-06-22 DIAGNOSIS — M4721 Other spondylosis with radiculopathy, occipito-atlanto-axial region: Secondary | ICD-10-CM | POA: Diagnosis not present

## 2021-06-22 DIAGNOSIS — M4728 Other spondylosis with radiculopathy, sacral and sacrococcygeal region: Secondary | ICD-10-CM | POA: Diagnosis not present

## 2021-06-22 DIAGNOSIS — M9901 Segmental and somatic dysfunction of cervical region: Secondary | ICD-10-CM | POA: Diagnosis not present

## 2021-07-17 ENCOUNTER — Other Ambulatory Visit: Payer: Self-pay | Admitting: Family Medicine

## 2021-07-17 DIAGNOSIS — B001 Herpesviral vesicular dermatitis: Secondary | ICD-10-CM

## 2021-07-22 DIAGNOSIS — M9903 Segmental and somatic dysfunction of lumbar region: Secondary | ICD-10-CM | POA: Diagnosis not present

## 2021-07-22 DIAGNOSIS — M9904 Segmental and somatic dysfunction of sacral region: Secondary | ICD-10-CM | POA: Diagnosis not present

## 2021-07-22 DIAGNOSIS — M4728 Other spondylosis with radiculopathy, sacral and sacrococcygeal region: Secondary | ICD-10-CM | POA: Diagnosis not present

## 2021-07-22 DIAGNOSIS — M4721 Other spondylosis with radiculopathy, occipito-atlanto-axial region: Secondary | ICD-10-CM | POA: Diagnosis not present

## 2021-07-22 DIAGNOSIS — M25532 Pain in left wrist: Secondary | ICD-10-CM | POA: Diagnosis not present

## 2021-07-22 DIAGNOSIS — M4726 Other spondylosis with radiculopathy, lumbar region: Secondary | ICD-10-CM | POA: Diagnosis not present

## 2021-07-22 DIAGNOSIS — M9901 Segmental and somatic dysfunction of cervical region: Secondary | ICD-10-CM | POA: Diagnosis not present

## 2021-07-22 DIAGNOSIS — M25541 Pain in joints of right hand: Secondary | ICD-10-CM | POA: Diagnosis not present

## 2021-07-22 DIAGNOSIS — M9907 Segmental and somatic dysfunction of upper extremity: Secondary | ICD-10-CM | POA: Diagnosis not present

## 2021-07-29 DIAGNOSIS — H31093 Other chorioretinal scars, bilateral: Secondary | ICD-10-CM | POA: Diagnosis not present

## 2021-07-29 DIAGNOSIS — H35411 Lattice degeneration of retina, right eye: Secondary | ICD-10-CM | POA: Diagnosis not present

## 2021-07-29 DIAGNOSIS — H43811 Vitreous degeneration, right eye: Secondary | ICD-10-CM | POA: Diagnosis not present

## 2021-07-29 DIAGNOSIS — H31012 Macula scars of posterior pole (postinflammatory) (post-traumatic), left eye: Secondary | ICD-10-CM | POA: Diagnosis not present

## 2021-09-01 ENCOUNTER — Ambulatory Visit (INDEPENDENT_AMBULATORY_CARE_PROVIDER_SITE_OTHER): Payer: Medicare Other | Admitting: Family Medicine

## 2021-09-01 ENCOUNTER — Encounter: Payer: Self-pay | Admitting: Family Medicine

## 2021-09-01 VITALS — BP 164/78 | HR 59 | Ht 70.0 in | Wt 155.0 lb

## 2021-09-01 DIAGNOSIS — I10 Essential (primary) hypertension: Secondary | ICD-10-CM

## 2021-09-01 DIAGNOSIS — Z9889 Other specified postprocedural states: Secondary | ICD-10-CM | POA: Insufficient documentation

## 2021-09-01 DIAGNOSIS — Z1211 Encounter for screening for malignant neoplasm of colon: Secondary | ICD-10-CM | POA: Diagnosis not present

## 2021-09-01 DIAGNOSIS — G2581 Restless legs syndrome: Secondary | ICD-10-CM | POA: Diagnosis not present

## 2021-09-01 MED ORDER — EPINEPHRINE 0.3 MG/0.3ML IJ SOAJ: 0.3000 mg | INTRAMUSCULAR | 1 refills | Status: AC | PRN

## 2021-09-01 NOTE — Progress Notes (Signed)
Billy Landry - 71 y.o. male MRN [id number]  Date of birth: Dec 29, 1950  Subjective No chief complaint on file.   HPI Billy Landry is a 71 y.o. male here today for follow up visit. Reports that he continues to do quite well.   His BP has remained well controlled with dietary and activity change.  Previously on Olmesartan.  Has been off of this for about 1 year.    Remains on gabapentin for RLS.  This works well for him and he denies side effects.    ROS:  A comprehensive ROS was completed and negative except as noted per HPI  Allergies  Allergen Reactions   Bee Venom Anaphylaxis and Swelling    Past Medical History:  Diagnosis Date   Allergy    Arthritis    hands, hips   BPH associated with nocturia    DJD of right shoulder    Essential hypertension 01/05/2015   Shingles 12/2017    Past Surgical History:  Procedure Laterality Date   CATARACT EXTRACTION Left 2013   RETINAL DETACHMENT SURGERY  2012   REVERSE SHOULDER ARTHROPLASTY Right 04/24/2019   Procedure: RIGHT REVERSE SHOULDER ARTHROPLASTY;  Surgeon: Hiram Gash, MD;  Location: [facility];  Service: Orthopedics;  Laterality: Right;   ROTATOR CUFF REPAIR Right 2010   TONSILECTOMY, ADENOIDECTOMY, BILATERAL MYRINGOTOMY AND TUBES  1968   VASECTOMY      Social History   Socioeconomic History   Marital status: Single    Spouse name: Not on file   Number of children: 3   Years of education: 16   Highest education level: Bachelor's degree (e.g., BA, AB, BS)  Occupational History   Occupation: retired    Comment: communications  Tobacco Use   Smoking status: Never   Smokeless tobacco: Never  Vaping Use   Vaping Use: Never used  Substance and Sexual Activity   Alcohol use: Yes    Alcohol/week: 2.0 standard drinks    Types: 2 Cans of beer per week    Comment: occ   Drug use: No   Sexual activity: Yes  Other Topics Concern   Not on file  Social History Narrative   Lives alone. He has three  children and four grand children. Did work for [employer]. He enjoys golfing, gardening and feeding birds and squirrels.    Social Determinants of Health   Financial Resource Strain: Not on file  Food Insecurity: Not on file  Transportation Needs: Not on file  Physical Activity: Not on file  Stress: Not on file  Social Connections: Not on file    Family History  Problem Relation Age of Onset   Cancer Mother    Diabetes Father    Stroke Father    Stroke Brother     Health Maintenance  Topic Date Due   COVID-19 Vaccine (1) Never done   Zoster Vaccines- Shingrix (1 of 2) Never done   COLONOSCOPY (Pts 45-85yr Insurance coverage will need to be confirmed)  03/29/2019   Pneumonia Vaccine 72 Years old (1 - PCV) 03/03/2022 (Originally 07/15/2014)   INFLUENZA VACCINE  10/26/2021   TETANUS/TDAP  10/25/2025   Hepatitis C Screening  Completed   HPV VACCINES  Aged Out     ----------------------------------------------------------------------------------------------------------------------------------------------------------------------------------------------------------------- Physical Exam BP (!) 164/78 (BP Location: Left Arm, Patient Position: Sitting, Cuff Size: Normal)   Pulse (!) 59   Ht '5\' 10"'$  (1.778 m)   Wt 155 lb (70.3 kg)   SpO2 99%   BMI  22.24 kg/m   Physical Exam Constitutional:      Appearance: Normal appearance.  Eyes:     General: No scleral icterus. Cardiovascular:     Rate and Rhythm: Normal rate and regular rhythm.  Pulmonary:     Effort: Pulmonary effort is normal.     Breath sounds: Normal breath sounds.  Neurological:     General: No focal deficit present.     Mental Status: He is alert.  Psychiatric:        Mood and Affect: Mood normal.        Behavior: Behavior normal.     ------------------------------------------------------------------------------------------------------------------------------------------------------------------------------------------------------------------- Assessment and Plan  Essential hypertension Home log reviewed and readings at home are very well controlled.  Slightly elevated in clinic today.  Improved some on recheck.  He will continue low sodium diet and active lifestyle.    RLS (restless legs syndrome) Continue gabapentin for RLS symptoms.     No orders of the defined types were placed in this encounter.   No follow-ups on file.    This visit occurred during the SARS-CoV-2 public health emergency.  Safety protocols were in place, including screening questions prior to the visit, additional usage of staff PPE, and extensive cleaning of exam room while observing appropriate contact time as indicated for disinfecting solutions.

## 2021-09-01 NOTE — Assessment & Plan Note (Signed)
Home log reviewed and readings at home are very well controlled.  Slightly elevated in clinic today.  Improved some on recheck.  He will continue low sodium diet and active lifestyle.

## 2021-09-01 NOTE — Assessment & Plan Note (Signed)
Continue gabapentin for RLS symptoms.

## 2021-09-06 DIAGNOSIS — Z1211 Encounter for screening for malignant neoplasm of colon: Secondary | ICD-10-CM | POA: Diagnosis not present

## 2021-09-13 ENCOUNTER — Other Ambulatory Visit: Payer: Self-pay | Admitting: Family Medicine

## 2021-09-13 DIAGNOSIS — R195 Other fecal abnormalities: Secondary | ICD-10-CM

## 2021-09-13 LAB — COLOGUARD: COLOGUARD: POSITIVE — AB

## 2021-10-05 ENCOUNTER — Ambulatory Visit: Payer: Medicare Other | Admitting: Family Medicine

## 2021-11-17 ENCOUNTER — Encounter: Payer: Self-pay | Admitting: General Practice

## 2021-11-22 ENCOUNTER — Other Ambulatory Visit: Payer: Self-pay | Admitting: Family Medicine

## 2021-11-22 DIAGNOSIS — B001 Herpesviral vesicular dermatitis: Secondary | ICD-10-CM

## 2021-11-25 DIAGNOSIS — M9901 Segmental and somatic dysfunction of cervical region: Secondary | ICD-10-CM | POA: Diagnosis not present

## 2021-11-25 DIAGNOSIS — M9903 Segmental and somatic dysfunction of lumbar region: Secondary | ICD-10-CM | POA: Diagnosis not present

## 2021-11-25 DIAGNOSIS — M5382 Other specified dorsopathies, cervical region: Secondary | ICD-10-CM | POA: Diagnosis not present

## 2021-11-25 DIAGNOSIS — M5417 Radiculopathy, lumbosacral region: Secondary | ICD-10-CM | POA: Diagnosis not present

## 2021-11-25 DIAGNOSIS — M7912 Myalgia of auxiliary muscles, head and neck: Secondary | ICD-10-CM | POA: Diagnosis not present

## 2021-11-25 DIAGNOSIS — M9902 Segmental and somatic dysfunction of thoracic region: Secondary | ICD-10-CM | POA: Diagnosis not present

## 2021-11-26 DIAGNOSIS — M9902 Segmental and somatic dysfunction of thoracic region: Secondary | ICD-10-CM | POA: Diagnosis not present

## 2021-11-26 DIAGNOSIS — M7912 Myalgia of auxiliary muscles, head and neck: Secondary | ICD-10-CM | POA: Diagnosis not present

## 2021-11-26 DIAGNOSIS — S40812A Abrasion of left upper arm, initial encounter: Secondary | ICD-10-CM | POA: Diagnosis not present

## 2021-11-26 DIAGNOSIS — M5382 Other specified dorsopathies, cervical region: Secondary | ICD-10-CM | POA: Diagnosis not present

## 2021-11-26 DIAGNOSIS — M9903 Segmental and somatic dysfunction of lumbar region: Secondary | ICD-10-CM | POA: Diagnosis not present

## 2021-11-26 DIAGNOSIS — M9901 Segmental and somatic dysfunction of cervical region: Secondary | ICD-10-CM | POA: Diagnosis not present

## 2021-11-26 DIAGNOSIS — M5417 Radiculopathy, lumbosacral region: Secondary | ICD-10-CM | POA: Diagnosis not present

## 2021-11-26 DIAGNOSIS — Z6823 Body mass index (BMI) 23.0-23.9, adult: Secondary | ICD-10-CM | POA: Diagnosis not present

## 2021-11-26 DIAGNOSIS — S0181XA Laceration without foreign body of other part of head, initial encounter: Secondary | ICD-10-CM | POA: Diagnosis not present

## 2021-12-01 DIAGNOSIS — Z4802 Encounter for removal of sutures: Secondary | ICD-10-CM | POA: Diagnosis not present

## 2021-12-02 ENCOUNTER — Telehealth: Payer: Self-pay
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
## 2021-12-03 NOTE — Telephone Encounter (Signed)
Patient is scheduled to see Dr. Darene Lamer on 9/11.

## 2021-12-06 ENCOUNTER — Ambulatory Visit (INDEPENDENT_AMBULATORY_CARE_PROVIDER_SITE_OTHER): Payer: Medicare Other | Admitting: Sports Medicine

## 2021-12-06 ENCOUNTER — Ambulatory Visit (INDEPENDENT_AMBULATORY_CARE_PROVIDER_SITE_OTHER): Payer: Medicare Other

## 2021-12-06 DIAGNOSIS — M79642 Pain in left hand: Secondary | ICD-10-CM | POA: Diagnosis not present

## 2021-12-06 DIAGNOSIS — M25532 Pain in left wrist: Secondary | ICD-10-CM | POA: Diagnosis not present

## 2021-12-06 DIAGNOSIS — M7989 Other specified soft tissue disorders: Secondary | ICD-10-CM | POA: Diagnosis not present
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
## 2021-12-09 DIAGNOSIS — Z9889 Other specified postprocedural states: Secondary | ICD-10-CM | POA: Diagnosis not present

## 2021-12-09 DIAGNOSIS — H2511 Age-related nuclear cataract, right eye: Secondary | ICD-10-CM | POA: Diagnosis not present

## 2021-12-21 DIAGNOSIS — Z1211 Encounter for screening for malignant neoplasm of colon: Secondary | ICD-10-CM | POA: Diagnosis not present

## 2021-12-21 DIAGNOSIS — R195 Other fecal abnormalities: Secondary | ICD-10-CM | POA: Diagnosis not present

## 2021-12-21 DIAGNOSIS — K5730 Diverticulosis of large intestine without perforation or abscess without bleeding: Secondary | ICD-10-CM | POA: Diagnosis not present

## 2021-12-22 DIAGNOSIS — H52213 Irregular astigmatism, bilateral: Secondary | ICD-10-CM | POA: Diagnosis not present

## 2021-12-22 DIAGNOSIS — Z9889 Other specified postprocedural states: Secondary | ICD-10-CM | POA: Diagnosis not present

## 2021-12-22 DIAGNOSIS — H59812 Chorioretinal scars after surgery for detachment, left eye: Secondary | ICD-10-CM | POA: Diagnosis not present

## 2021-12-22 DIAGNOSIS — H35413 Lattice degeneration of retina, bilateral: Secondary | ICD-10-CM | POA: Diagnosis not present

## 2021-12-22 DIAGNOSIS — Z961 Presence of intraocular lens: Secondary | ICD-10-CM | POA: Diagnosis not present

## 2021-12-22 DIAGNOSIS — H25811 Combined forms of age-related cataract, right eye: Secondary | ICD-10-CM | POA: Diagnosis not present

## 2021-12-27 ENCOUNTER — Encounter: Payer: Self-pay | Admitting: Sports Medicine

## 2021-12-27 ENCOUNTER — Ambulatory Visit (INDEPENDENT_AMBULATORY_CARE_PROVIDER_SITE_OTHER): Payer: Medicare Other | Admitting: Sports Medicine

## 2021-12-27 DIAGNOSIS — M79642 Pain in left hand: Secondary | ICD-10-CM | POA: Diagnosis not present
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
## 2021-12-31 ENCOUNTER — Ambulatory Visit (INDEPENDENT_AMBULATORY_CARE_PROVIDER_SITE_OTHER): Payer: Medicare Other | Admitting: Medical-Surgical

## 2021-12-31 DIAGNOSIS — Z0000 Encounter for general adult medical examination without abnormal findings: Secondary | ICD-10-CM | POA: Diagnosis not present

## 2021-12-31 NOTE — Patient Instructions (Addendum)
[facility] Maintenance Summary and Written Plan of Care  [name] ,  Thank you for allowing me to perform your Medicare Annual Wellness Visit and for your ongoing commitment to your health.   Health Maintenance & Immunization History Health Maintenance  Topic Date Due   COVID-19 Vaccine (1) 01/16/2022 (Originally 07/15/1955)   Pneumonia Vaccine 55+ Years old (1 - PCV) 03/03/2022 (Originally 07/15/2014)   Zoster Vaccines- Shingrix (1 of 2) 04/02/2022 (Originally 07/14/1969)   INFLUENZA VACCINE  06/26/2022 (Originally 10/26/2021)   TETANUS/TDAP  10/25/2025   Hepatitis C Screening  Completed   HPV VACCINES  Aged Out   COLONOSCOPY (Pts 45-32yr Insurance coverage will need to be confirmed)  Discontinued   Immunization History  Administered Date(s) Administered   Tdap 10/26/2015    These are the patient goals that we discussed:  Goals Addressed               This Visit's Progress     Patient Stated (pt-stated)        12/31/2021 AWV Goal: Exercise for General Health  Patient will verbalize understanding of the benefits of increased physical activity: Exercising regularly is important. It will improve your overall fitness, flexibility, and endurance. Regular exercise also will improve your overall health. It can help you control your weight, reduce stress, and improve your bone density. Over the next year, patient will increase physical activity as tolerated with a goal of at least 150 minutes of moderate physical activity per week.  You can tell that you are exercising at a moderate intensity if your heart starts beating faster and you start breathing faster but can still hold a conversation. Moderate-intensity exercise ideas include: Walking 1 mile (1.6 km) in about 15 minutes Biking Hiking Golfing Dancing Water aerobics Patient will verbalize understanding of everyday activities that increase physical activity by providing examples like the  following: Yard work, such as: PSales promotion account executiveGardening Washing windows or floors Patient will be able to explain general safety guidelines for exercising:  Before you start a new exercise program, talk with your health care provider. Do not exercise so much that you hurt yourself, feel dizzy, or get very short of breath. Wear comfortable clothes and wear shoes with good support. Drink plenty of water while you exercise to prevent dehydration or heat stroke. Work out until your breathing and your heartbeat get faster.          This is a list of Health Maintenance Items that are overdue or due now: Pneumococcal vaccine  Influenza vaccine Shingles vaccine  Patient declined vaccines at this time.  Orders/Referrals Placed Today: No orders of the defined types were placed in this encounter.  (Contact our referral department at [phone] you have not spoken with someone about your referral appointment within the next 5 days)    Follow-up Plan Follow-up with MLuetta Nutting DO as planned Medicare wellness visit in one year.  Patient will access AVS on my chart.     Health Maintenance, Male Adopting a healthy lifestyle and getting preventive care are important in promoting health and wellness. Ask your health care provider about: The right schedule for you to have regular tests and exams. Things you can do on your own to prevent diseases and keep yourself healthy. What should I know about diet, weight, and exercise? Eat a healthy diet  Eat a diet that includes plenty of vegetables,  fruits, low-fat dairy products, and lean protein. Do not eat a lot of foods that are high in solid fats, added sugars, or sodium. Maintain a healthy weight Body mass index (BMI) is a measurement that can be used to identify possible weight problems. It estimates body fat based on height and weight. Your health  care provider can help determine your BMI and help you achieve or maintain a healthy weight. Get regular exercise Get regular exercise. This is one of the most important things you can do for your health. Most adults should: Exercise for at least 150 minutes each week. The exercise should increase your heart rate and make you sweat (moderate-intensity exercise). Do strengthening exercises at least twice a week. This is in addition to the moderate-intensity exercise. Spend less time sitting. Even light physical activity can be beneficial. Watch cholesterol and blood lipids Have your blood tested for lipids and cholesterol at 72 years of age, then have this test every 5 years. You may need to have your cholesterol levels checked more often if: Your lipid or cholesterol levels are high. You are older than 72 years of age. You are at high risk for heart disease. What should I know about cancer screening? Many types of cancers can be detected early and may often be prevented. Depending on your health history and family history, you may need to have cancer screening at various ages. This may include screening for: Colorectal cancer. Prostate cancer. Skin cancer. Lung cancer. What should I know about heart disease, diabetes, and high blood pressure? Blood pressure and heart disease High blood pressure causes heart disease and increases the risk of stroke. This is more likely to develop in people who have high blood pressure readings or are overweight. Talk with your health care provider about your target blood pressure readings. Have your blood pressure checked: Every 3-5 years if you are 24-75 years of age. Every year if you are 35 years old or older. If you are between the ages of 84 and 36 and are a current or former smoker, ask your health care provider if you should have a one-time screening for abdominal aortic aneurysm (AAA). Diabetes Have regular diabetes screenings. This checks your  fasting blood sugar level. Have the screening done: Once every three years after age 56 if you are at a normal weight and have a low risk for diabetes. More often and at a younger age if you are overweight or have a high risk for diabetes. What should I know about preventing infection? Hepatitis B If you have a higher risk for hepatitis B, you should be screened for this virus. Talk with your health care provider to find out if you are at risk for hepatitis B infection. Hepatitis C Blood testing is recommended for: Everyone born from [birth year] through 1966. Anyone with known risk factors for hepatitis C. Sexually transmitted infections (STIs) You should be screened each year for STIs, including gonorrhea and chlamydia, if: You are sexually active and are younger than 72 years of age. You are older than 72 years of age and your health care provider tells you that you are at risk for this type of infection. Your sexual activity has changed since you were last screened, and you are at increased risk for chlamydia or gonorrhea. Ask your health care provider if you are at risk. Ask your health care provider about whether you are at high risk for HIV. Your health care provider may recommend a prescription medicine to help  prevent HIV infection. If you choose to take medicine to prevent HIV, you should first get tested for HIV. You should then be tested every 3 months for as long as you are taking the medicine. Follow these instructions at home: Alcohol use Do not drink alcohol if your health care provider tells you not to drink. If you drink alcohol: Limit how much you have to 0-2 drinks a day. Know how much alcohol is in your drink. In the [place] one drink equals one 12 oz bottle of beer (355 mL), one 5 oz glass of wine (148 mL), or one 1 oz glass of hard liquor (44 mL). Lifestyle Do not use any products that contain nicotine or tobacco. These products include cigarettes, chewing tobacco, and vaping  devices, such as e-cigarettes. If you need help quitting, ask your health care provider. Do not use street drugs. Do not share needles. Ask your health care provider for help if you need support or information about quitting drugs. General instructions Schedule regular health, dental, and eye exams. Stay current with your vaccines. Tell your health care provider if: You often feel depressed. You have ever been abused or do not feel safe at home. Summary Adopting a healthy lifestyle and getting preventive care are important in promoting health and wellness. Follow your health care provider's instructions about healthy diet, exercising, and getting tested or screened for diseases. Follow your health care provider's instructions on monitoring your cholesterol and blood pressure. This information is not intended to replace advice given to you by your health care provider. Make sure you discuss any questions you have with your health care provider. Document Revised: 08/03/2020 Document Reviewed: 08/03/2020 Elsevier Patient Education  [place].

## 2021-12-31 NOTE — Progress Notes (Signed)
MEDICARE ANNUAL WELLNESS VISIT  12/31/2021  Telephone Visit Disclaimer This Medicare AWV was conducted by telephone due to national recommendations for restrictions regarding the COVID-19 Pandemic (e.g. social distancing).  I verified, using two identifiers, that I am speaking with Billy Landry or their authorized healthcare agent. I discussed the limitations, risks, security, and privacy concerns of performing an evaluation and management service by telephone and the potential availability of an in-person appointment in the future. The patient expressed understanding and agreed to proceed.  Location of Patient: Home Location of Provider (nurse): Provider home  Subjective:    Billy Landry is a 71 y.o. male patient of Luetta Nutting, DO who had a Medicare Annual Wellness Visit today via telephone. Billy Landry is Retired and lives with friends. he has 3 children. he reports that he is socially active and does interact with friends/family regularly. he is moderately physically active and enjoys researching, gardening and playing golf.  Patient Care Team: Luetta Nutting, DO as PCP - General (Family Medicine)     08/19/2020    9:16 AM 05/20/2019    3:05 PM 05/13/2019    8:56 AM 04/24/2019    6:31 AM 04/16/2019    9:34 AM 01/29/2018    9:05 AM 01/05/2015   10:13 AM  Advanced Directives  Does Patient Have a Medical Advance Directive? Yes Yes Yes Yes Yes Yes No  Type of Advance Directive Living will Living will Living will  Living will Living will   Does patient want to make changes to medical advance directive? No - Patient declined No - Patient declined No - Patient declined No - Patient declined No - Patient declined No - Patient declined   Would patient like information on creating a medical advance directive?  No - Patient declined No - Patient declined No - Patient declined   No - patient declined information    Hospital Utilization Over the Past 12 Months: # of hospitalizations or ER  visits: 0 # of surgeries: 0  Review of Systems    Patient reports that his overall health is better compared to last year.  History obtained from chart review and the patient  Patient Reported Readings (BP, Pulse, CBG, Weight, etc) none  Pain Assessment Pain : No/denies pain     Current Medications & Allergies (verified) Allergies as of 12/31/2021       Reactions   Bee Venom Anaphylaxis, Swelling        Medication List        Accurate as of December 31, 2021 10:21 AM. If you have any questions, ask your nurse or doctor.          B-COMPLEX/B-12 PO Take by mouth.   CALCIUM 1200 PO   Cholecalciferol 25 MCG (1000 UT) tablet Take by mouth.   Cinnamon 500 MG capsule Take by mouth.   COLLAGEN PO Take by mouth.   MENS PROSTATE HEALTH FORMULA PO Take by mouth.   Elderberry Immune Complex Chew   EPINEPHrine 0.3 mg/0.3 mL Soaj injection Commonly known as: EPI-PEN Inject 0.3 mg into the muscle as needed for anaphylaxis.   gabapentin 100 MG capsule Commonly known as: NEURONTIN Take 1-3 capsules (100-300 mg total) by mouth at bedtime.   L-Lysine 1000 MG Tabs Take by mouth.   loratadine 10 MG tablet Commonly known as: CLARITIN Take 10 mg by mouth daily.   LUTEIN-ZEAXANTHIN PO Take by mouth.   Magnesium 250 MG Tabs   MAGNESIUM 27 PO Take by mouth.  MCT OIL PO Take by mouth.   valACYclovir 1000 MG tablet Commonly known as: VALTREX TAKE ONE-HALF (1/2) TABLET DAILY AS NEEDED   VITAMIN C PO Take by mouth.   vitamin E 180 MG (400 UNITS) capsule Take 400 Units by mouth 2 (two) times daily.   Zinc 50 MG Tabs        History (reviewed): Past Medical History:  Diagnosis Date   Allergy    Arthritis    hands, hips   BPH associated with nocturia    Cataract 3 years ago & current   Left eye done. Right eye needs to be.   DJD of right shoulder    Essential hypertension 01/05/2015   Shingles 12/2017   Past Surgical History:  Procedure  Laterality Date   CATARACT EXTRACTION Left 03/29/2011   EYE SURGERY  2 years ago   Detatched left retina - again May 2021   JOINT REPLACEMENT  January 2021   Reverse Replacement Right Shoulder   RETINAL DETACHMENT SURGERY  03/28/2010   REVERSE SHOULDER ARTHROPLASTY Right 04/24/2019   Procedure: RIGHT REVERSE SHOULDER ARTHROPLASTY;  Surgeon: Hiram Gash, MD;  Location: [facility];  Service: Orthopedics;  Laterality: Right;   ROTATOR CUFF REPAIR Right 03/28/2008   TONSILECTOMY, ADENOIDECTOMY, BILATERAL MYRINGOTOMY AND TUBES  03/28/1966   VASECTOMY     Family History  Problem Relation Age of Onset   Cancer Mother    Diabetes Father    Stroke Father    Stroke Brother    COPD Sister    Diabetes Brother    Social History   Socioeconomic History   Marital status: Single    Spouse name: Not on file   Number of children: 3   Years of education: 16   Highest education level: Bachelor's degree (e.g., BA, AB, BS)  Occupational History   Occupation: retired    Comment: communications  Tobacco Use   Smoking status: Never   Smokeless tobacco: Never  Vaping Use   Vaping Use: Never used  Substance and Sexual Activity   Alcohol use: Yes    Alcohol/week: 2.0 standard drinks of alcohol    Types: 1 Glasses of wine, 1 Shots of liquor per week    Comment: Random - not every day, not every week...   Drug use: Never   Sexual activity: Yes    Birth control/protection: None  Other Topics Concern   Not on file  Social History Narrative   Lives alone. He has three children and four grand children. Did work for [employer]. He enjoys golfing, gardening and feeding birds and squirrels.    Social Determinants of Health   Financial Resource Strain: Low Risk  (08/19/2020)   Overall Financial Resource Strain (CARDIA)    Difficulty of Paying Living Expenses: Not hard at all  Food Insecurity: No Food Insecurity (08/19/2020)   Hunger Vital Sign    Worried About Running  Out of Food in the Last Year: Never true    Ran Out of Food in the Last Year: Never true  Transportation Needs: No Transportation Needs (08/19/2020)   PRAPARE - Hydrologist (Medical): No    Lack of Transportation (Non-Medical): No  Physical Activity: Sufficiently Active (08/19/2020)   Exercise Vital Sign    Days of Exercise per Week: 7 days    Minutes of Exercise per Session: 30 min  Stress: No Stress Concern Present (08/19/2020)   Talent  Questionnaire    Feeling of Stress : Not at all  Social Connections: Moderately Isolated (08/19/2020)   Social Connection and Isolation Panel [NHANES]    Frequency of Communication with Friends and Family: More than three times a week    Frequency of Social Gatherings with Friends and Family: Twice a week    Attends Religious Services: 1 to 4 times per year    Active Member of [employer] or Organizations: No    Attends Archivist Meetings: Never    Marital Status: Divorced    Activities of Daily Living    12/27/2021    2:55 PM  In your present state of health, do you have any difficulty performing the following activities:  Hearing? 0  Vision? 1  Difficulty concentrating or making decisions? 0  Walking or climbing stairs? 0  Dressing or bathing? 0  Doing errands, shopping? 0  Preparing Food and eating ? N  Using the Toilet? N  In the past six months, have you accidently leaked urine? Y  Do you have problems with loss of bowel control? N  Managing your Medications? N  Managing your Finances? N  Housekeeping or managing your Housekeeping? N    Patient Education/ Literacy How often do you need to have someone help you when you read instructions, pamphlets, or other written materials from your doctor or pharmacy?: 1 - Never What is the last grade level you completed in school?: Bachelor's degree  Exercise    Diet Patient reports consuming 2 meals a day  and 1 snack(s) a day Patient reports that his primary diet is: Regular Patient reports that she does have regular access to food.   Depression Screen    09/01/2021    9:01 AM 08/19/2020    9:17 AM 05/20/2019    3:06 PM 01/30/2019    9:15 AM 01/29/2018    9:06 AM 01/24/2017    1:57 PM  PHQ 2/9 Scores  PHQ - 2 Score 0 0 0 0 0 0     Fall Risk    12/27/2021    2:55 PM 09/01/2021    9:00 AM 08/19/2020    9:16 AM 05/20/2019    3:06 PM 02/20/2019    5:06 PM  [place] in the past year? 0 0 0 0 0  Comment     Emmi Telephone Survey: data to providers prior to load  Number falls in past yr: 0 0 0    Injury with Fall? 1 0 0    Risk for fall due to :  No Fall Risks No Fall Risks No Fall Risks   Follow up  Falls evaluation completed Falls evaluation completed Falls prevention discussed      Objective:  Billy Landry seemed alert and oriented and he participated appropriately during our telephone visit.  Blood Pressure Weight BMI  BP Readings from Last 3 Encounters:  09/01/21 (!) 164/78  03/03/21 (!) 142/79  09/10/20 125/71   Wt Readings from Last 3 Encounters:  12/27/21 155 lb (70.3 kg)  09/01/21 155 lb (70.3 kg)  03/03/21 157 lb 0.6 oz (71.2 kg)   BMI Readings from Last 1 Encounters:  12/27/21 22.24 kg/m    *Unable to obtain current vital signs, weight, and BMI due to telephone visit type  Hearing/Vision  Billy Landry did not seem to have difficulty with hearing/understanding during the telephone conversation Reports that he has had a formal eye exam by an eye care professional within the past year  Reports that he has not had a formal hearing evaluation within the past year *Unable to fully assess hearing and vision during telephone visit type  Cognitive Function:    08/19/2020    9:31 AM 05/20/2019    3:08 PM 01/29/2018    9:09 AM  6CIT Screen  What Year? 0 points 0 points 0 points  What month? 0 points 0 points 0 points  What time? 0 points 0 points 0 points  Count back  from 20 0 points 0 points 0 points  Months in reverse 0 points 0 points 0 points  Repeat phrase 0 points 0 points 0 points  Total Score 0 points 0 points 0 points   (Normal:0-7, Significant for Dysfunction: >8)  Normal Cognitive Function Screening: Yes   Immunization & Health Maintenance Record Immunization History  Administered Date(s) Administered   Tdap 10/26/2015    Health Maintenance  Topic Date Due   COVID-19 Vaccine (1) Never done   Zoster Vaccines- Shingrix (1 of 2) Never done   INFLUENZA VACCINE  Never done   Pneumonia Vaccine 14+ Years old (1 - PCV) 03/03/2022 (Originally 07/15/2014)   TETANUS/TDAP  10/25/2025   Hepatitis C Screening  Completed   HPV VACCINES  Aged Out   COLONOSCOPY (Pts 45-78yr Insurance coverage will need to be confirmed)  Discontinued       Assessment  This is a routine wellness examination for Billy Landry  Health Maintenance: Due or Overdue Health Maintenance Due  Topic Date Due   COVID-19 Vaccine (1) Never done   Zoster Vaccines- Shingrix (1 of 2) Never done   INFLUENZA VACCINE  Never done    RBoris Landry not need a referral for Community Assistance: Care Management:   no Social Work:    no Prescription Assistance:  no Nutrition/Diabetes Education:  no   Plan:  Personalized Goals  Goals Addressed   None    Personalized Health Maintenance & Screening Recommendations  Pneumococcal vaccine  Influenza vaccine Shingles vaccine  Patient declined vaccines at this time.  Lung Cancer Screening Recommended: no (Low Dose CT Chest recommended if Age 72-80years, 30 pack-year currently smoking OR have quit w/in past 15 years) Hepatitis C Screening recommended: no HIV Screening recommended: no  Advanced Directives: Written information was not prepared per patient's request.  Referrals & Orders No orders of the defined types were placed in this encounter.   Follow-up Plan Follow-up with MLuetta Nutting DO as  planned Medicare wellness visit in one year.  Patient will access AVS on my chart.   I have personally reviewed and noted the following in the patient's chart:   Medical and social history Use of alcohol, tobacco or illicit drugs  Current medications and supplements Functional ability and status Nutritional status Physical activity Advanced directives List of other physicians Hospitalizations, surgeries, and ER visits in previous 12 months Vitals Screenings to include cognitive, depression, and falls Referrals and appointments  In addition, I have reviewed and discussed with RBoris Sharpercertain preventive protocols, quality metrics, and best practice recommendations. A written personalized care plan for preventive services as well as general preventive health recommendations is available and can be mailed to the patient at his request.      BTinnie Gens 12/31/2021

## 2022-01-31 DIAGNOSIS — H35372 Puckering of macula, left eye: Secondary | ICD-10-CM | POA: Diagnosis not present

## 2022-01-31 DIAGNOSIS — H33012 Retinal detachment with single break, left eye: Secondary | ICD-10-CM | POA: Diagnosis not present

## 2022-01-31 DIAGNOSIS — H25811 Combined forms of age-related cataract, right eye: Secondary | ICD-10-CM | POA: Diagnosis not present

## 2022-01-31 DIAGNOSIS — H43811 Vitreous degeneration, right eye: Secondary | ICD-10-CM | POA: Diagnosis not present

## 2022-02-09 DIAGNOSIS — H59021 Cataract (lens) fragments in eye following cataract surgery, right eye: Secondary | ICD-10-CM | POA: Insufficient documentation

## 2022-02-09 DIAGNOSIS — H25811 Combined forms of age-related cataract, right eye: Secondary | ICD-10-CM | POA: Diagnosis not present

## 2022-02-09 DIAGNOSIS — Z961 Presence of intraocular lens: Secondary | ICD-10-CM | POA: Diagnosis not present

## 2022-03-03 ENCOUNTER — Encounter: Payer: Self-pay | Admitting: Family Medicine

## 2022-03-03 ENCOUNTER — Ambulatory Visit (INDEPENDENT_AMBULATORY_CARE_PROVIDER_SITE_OTHER): Payer: Medicare Other | Admitting: Family Medicine

## 2022-03-03 VITALS — BP 162/77 | HR 62 | Ht 70.0 in | Wt 161.0 lb

## 2022-03-03 DIAGNOSIS — Z114 Encounter for screening for human immunodeficiency virus [HIV]: Secondary | ICD-10-CM | POA: Diagnosis not present

## 2022-03-03 DIAGNOSIS — I10 Essential (primary) hypertension: Secondary | ICD-10-CM

## 2022-03-03 DIAGNOSIS — N401 Enlarged prostate with lower urinary tract symptoms: Secondary | ICD-10-CM | POA: Diagnosis not present

## 2022-03-03 DIAGNOSIS — B001 Herpesviral vesicular dermatitis: Secondary | ICD-10-CM

## 2022-03-03 DIAGNOSIS — G2581 Restless legs syndrome: Secondary | ICD-10-CM

## 2022-03-03 DIAGNOSIS — R030 Elevated blood-pressure reading, without diagnosis of hypertension: Secondary | ICD-10-CM | POA: Diagnosis not present

## 2022-03-03 DIAGNOSIS — Z13220 Encounter for screening for lipoid disorders: Secondary | ICD-10-CM | POA: Diagnosis not present

## 2022-03-03 DIAGNOSIS — Z96611 Presence of right artificial shoulder joint: Secondary | ICD-10-CM | POA: Insufficient documentation

## 2022-03-03 DIAGNOSIS — Z202 Contact with and (suspected) exposure to infections with a predominantly sexual mode of transmission: Secondary | ICD-10-CM | POA: Diagnosis not present

## 2022-03-03 DIAGNOSIS — B009 Herpesviral infection, unspecified: Secondary | ICD-10-CM | POA: Diagnosis not present

## 2022-03-03 DIAGNOSIS — N138 Other obstructive and reflux uropathy: Secondary | ICD-10-CM | POA: Diagnosis not present

## 2022-03-03 MED ORDER — GABAPENTIN 100 MG PO CAPS: 100.0000 mg | ORAL_CAPSULE | Freq: Every day | ORAL | 3 refills | Status: DC

## 2022-03-03 MED ORDER — VALACYCLOVIR HCL 1 G PO TABS: ORAL_TABLET | ORAL | 3 refills | Status: DC

## 2022-03-03 NOTE — Assessment & Plan Note (Addendum)
Blood pressures at home have been quite well controlled.  A few outliers on home readings.  Continue low sodium diet and active lifestyle.

## 2022-03-03 NOTE — Assessment & Plan Note (Signed)
Update PSA 

## 2022-03-03 NOTE — Progress Notes (Signed)
Billy Landry - 71 y.o. male MRN [id number]  Date of birth: 05/23/1950  Subjective Chief Complaint  Patient presents with   Hypertension    HPI Billy Landry is a 71 y.o. male here today for follow up of HTN and have updated labs.   He is monitoring BP at home closely.  Readings range from 111-153/63-83.  Averaging around 130/75.  He is not currently on any medications for management of blood pressure.  He denies symptoms related to HTN including chest pain, shortness of breath, palpitations, headache or vision changes.  He does follow a low sodium diet and is very active.  He denies symptoms related to HTN including chest pain, shortness of breath, palpitations, headache or vision changes.   Continues on gabapentin for RLS.  This continues to work well for him.   Valtrex continues to work well for HSV outbreak prevention.    ROS:  A comprehensive ROS was completed and negative except as noted per HPI  Allergies  Allergen Reactions   Bee Venom Anaphylaxis and Swelling    Past Medical History:  Diagnosis Date   Allergy    Arthritis    hands, hips   BPH associated with nocturia    Cataract 3 years ago & current   Left eye done. Right eye needs to be.   DJD of right shoulder    Essential hypertension 01/05/2015   Shingles 12/2017    Past Surgical History:  Procedure Laterality Date   CATARACT EXTRACTION Left 03/29/2011   EYE SURGERY  2 years ago   Detatched left retina - again May 2021   JOINT REPLACEMENT  January 2021   Reverse Replacement Right Shoulder   RETINAL DETACHMENT SURGERY  03/28/2010   REVERSE SHOULDER ARTHROPLASTY Right 04/24/2019   Procedure: RIGHT REVERSE SHOULDER ARTHROPLASTY;  Surgeon: Hiram Gash, MD;  Location: [facility];  Service: Orthopedics;  Laterality: Right;   ROTATOR CUFF REPAIR Right 03/28/2008   TONSILECTOMY, ADENOIDECTOMY, BILATERAL MYRINGOTOMY AND TUBES  03/28/1966   VASECTOMY      Social History   Socioeconomic  History   Marital status: Single    Spouse name: Not on file   Number of children: 3   Years of education: 16   Highest education level: Bachelor's degree (e.g., BA, AB, BS)  Occupational History   Occupation: retired    Comment: communications  Tobacco Use   Smoking status: Never   Smokeless tobacco: Never  Vaping Use   Vaping Use: Never used  Substance and Sexual Activity   Alcohol use: Yes    Alcohol/week: 2.0 standard drinks of alcohol    Types: 1 Glasses of wine, 1 Shots of liquor per week    Comment: Random - not every day, not every week...   Drug use: Never   Sexual activity: Yes    Birth control/protection: None  Other Topics Concern   Not on file  Social History Narrative   Lives with his friends. He has three children and four grand children. Did work for [employer]. He enjoys golfing, gardening and feeding birds and squirrels.    Social Determinants of Health   Financial Resource Strain: Low Risk  (12/27/2021)   Overall Financial Resource Strain (CARDIA)    Difficulty of Paying Living Expenses: Not hard at all  Food Insecurity: No Food Insecurity (12/27/2021)   Hunger Vital Sign    Worried About Running Out of Food in the Last Year: Never true    Ran Out of Food  in the Last Year: Never true  Transportation Needs: Unknown (12/27/2021)   PRAPARE - Hydrologist (Medical): Not on file    Lack of Transportation (Non-Medical): No  Physical Activity: Insufficiently Active (12/27/2021)   Exercise Vital Sign    Days of Exercise per Week: 4 days    Minutes of Exercise per Session: 30 min  Stress: No Stress Concern Present (12/27/2021)   [place]    Feeling of Stress : Not at all  Social Connections: Unknown (12/31/2021)   Social Connection and Isolation Panel [NHANES]    Frequency of Communication with Friends and Family: Once a week    Frequency of Social  Gatherings with Friends and Family: More than three times a week    Attends Religious Services: Never    Marine scientist or Organizations: No    Attends Music therapist: Never    Marital Status: Patient refused    Family History  Problem Relation Age of Onset   Cancer Mother    Diabetes Father    Stroke Father    Stroke Brother    COPD Sister    Diabetes Brother     Health Maintenance  Topic Date Due   Pneumonia Vaccine 16+ Years old (1 - PCV) 03/03/2022 (Originally 07/15/2014)   Zoster Vaccines- Shingrix (1 of 2) 04/02/2022 (Originally 07/14/1968)   COVID-19 Vaccine (1) 04/28/2022 (Originally 07/15/1955)   INFLUENZA VACCINE  06/26/2022 (Originally 10/26/2021)   Medicare Annual Wellness (AWV)  01/01/2023   DTaP/Tdap/Td (2 - Td or Tdap) 10/25/2025   Hepatitis C Screening  Completed   HPV VACCINES  Aged Out   COLONOSCOPY (Pts 45-36yr Insurance coverage will need to be confirmed)  Discontinued     ----------------------------------------------------------------------------------------------------------------------------------------------------------------------------------------------------------------- Physical Exam BP (!) 162/77 (BP Location: Left Arm, Patient Position: Sitting, Cuff Size: Normal)   Pulse 62   Ht '5\' 10"'$  (1.778 m)   Wt 161 lb (73 kg)   SpO2 98%   BMI 23.10 kg/m   Physical Exam Constitutional:      Appearance: Normal appearance.  HENT:     Head: Normocephalic and atraumatic.  Eyes:     General: No scleral icterus. Cardiovascular:     Rate and Rhythm: Normal rate and regular rhythm.  Pulmonary:     Effort: Pulmonary effort is normal.     Breath sounds: Normal breath sounds.  Neurological:     Mental Status: He is alert.  Psychiatric:        Mood and Affect: Mood normal.        Behavior: Behavior normal.      ------------------------------------------------------------------------------------------------------------------------------------------------------------------------------------------------------------------- Assessment and Plan  White coat syndrome with high blood pressure but without hypertension Blood pressures at home have been quite well controlled.  A few outliers on home readings.  Continue low sodium diet and active lifestyle.   BPH with obstruction/lower urinary tract symptoms Update PSA  RLS (restless legs syndrome) Gabapentin remains effective for him.  Updating labs.   HSV infection No recent outbreaks. Continue valtrex.    Meds ordered this encounter  Medications   gabapentin (NEURONTIN) 100 MG capsule    Sig: Take 1-3 capsules (100-300 mg total) by mouth at bedtime.    Dispense:  270 capsule    Refill:  3   valACYclovir (VALTREX) 1000 MG tablet    Sig: TAKE ONE-HALF (1/2) TABLET DAILY AS NEEDED    Dispense:  45 tablet  Refill:  3    No follow-ups on file.    This visit occurred during the SARS-CoV-2 public health emergency.  Safety protocols were in place, including screening questions prior to the visit, additional usage of staff PPE, and extensive cleaning of exam room while observing appropriate contact time as indicated for disinfecting solutions.

## 2022-03-03 NOTE — Assessment & Plan Note (Signed)
Gabapentin remains effective for him.  Updating labs.

## 2022-03-03 NOTE — Assessment & Plan Note (Signed)
No recent outbreaks. Continue valtrex.

## 2022-03-04 DIAGNOSIS — Z13220 Encounter for screening for lipoid disorders: Secondary | ICD-10-CM | POA: Diagnosis not present

## 2022-03-04 DIAGNOSIS — N401 Enlarged prostate with lower urinary tract symptoms: Secondary | ICD-10-CM | POA: Diagnosis not present

## 2022-03-04 DIAGNOSIS — Z202 Contact with and (suspected) exposure to infections with a predominantly sexual mode of transmission: Secondary | ICD-10-CM | POA: Diagnosis not present

## 2022-03-04 DIAGNOSIS — N138 Other obstructive and reflux uropathy: Secondary | ICD-10-CM | POA: Diagnosis not present

## 2022-03-04 DIAGNOSIS — I10 Essential (primary) hypertension: Secondary | ICD-10-CM | POA: Diagnosis not present

## 2022-03-07 LAB — CBC WITH DIFFERENTIAL/PLATELET
Absolute Monocytes: 523 cells/uL (ref 200–950)
Basophils Absolute: 29 cells/uL (ref 0–200)
Basophils Relative: 0.6 %
Eosinophils Absolute: 120 cells/uL (ref 15–500)
Eosinophils Relative: 2.5 %
HCT: 38.9 % (ref 38.5–50.0)
Hemoglobin: 13.6 g/dL (ref 13.2–17.1)
Lymphs Abs: 1699 cells/uL (ref 850–3900)
MCH: 36.9 pg — ABNORMAL HIGH (ref 27.0–33.0)
MCHC: 35 g/dL (ref 32.0–36.0)
MCV: 105.4 fL — ABNORMAL HIGH (ref 80.0–100.0)
MPV: 12.6 fL — ABNORMAL HIGH (ref 7.5–12.5)
Monocytes Relative: 10.9 %
Neutro Abs: 2429 cells/uL (ref 1500–7800)
Neutrophils Relative %: 50.6 %
Platelets: 127 10*3/uL — ABNORMAL LOW (ref 140–400)
RBC: 3.69 10*6/uL — ABNORMAL LOW (ref 4.20–5.80)
RDW: 12.1 % (ref 11.0–15.0)
Total Lymphocyte: 35.4 %
WBC: 4.8 10*3/uL (ref 3.8–10.8)

## 2022-03-07 LAB — LIPID PANEL W/REFLEX DIRECT LDL
Cholesterol: 202 mg/dL — ABNORMAL HIGH
HDL: 56 mg/dL
LDL Cholesterol (Calc): 128 mg/dL — ABNORMAL HIGH
Non-HDL Cholesterol (Calc): 146 mg/dL — ABNORMAL HIGH
Total CHOL/HDL Ratio: 3.6 (calc)
Triglycerides: 81 mg/dL

## 2022-03-07 LAB — COMPLETE METABOLIC PANEL WITH GFR
AG Ratio: 2 (calc) (ref 1.0–2.5)
ALT: 26 U/L (ref 9–46)
AST: 27 U/L (ref 10–35)
Albumin: 4.2 g/dL (ref 3.6–5.1)
Alkaline phosphatase (APISO): 79 U/L (ref 35–144)
BUN/Creatinine Ratio: 30 (calc) — ABNORMAL HIGH (ref 6–22)
BUN: 27 mg/dL — ABNORMAL HIGH (ref 7–25)
CO2: 32 mmol/L (ref 20–32)
Calcium: 9.8 mg/dL (ref 8.6–10.3)
Chloride: 106 mmol/L (ref 98–110)
Creat: 0.89 mg/dL (ref 0.70–1.28)
Globulin: 2.1 g/dL (calc) (ref 1.9–3.7)
Glucose, Bld: 108 mg/dL — ABNORMAL HIGH (ref 65–99)
Potassium: 4.5 mmol/L (ref 3.5–5.3)
Sodium: 142 mmol/L (ref 135–146)
Total Bilirubin: 0.6 mg/dL (ref 0.2–1.2)
Total Protein: 6.3 g/dL (ref 6.1–8.1)
eGFR: 91 mL/min/{1.73_m2} (ref >=60)

## 2022-03-07 LAB — TSH: TSH: 3.05 mIU/L (ref 0.40–4.50)

## 2022-03-07 LAB — CHLAMYDIA/NEISSERIA GONORRHOEAE RNA,TMA,UROGENTIAL
C. trachomatis RNA, TMA: NOT DETECTED
N. gonorrhoeae RNA, TMA: NOT DETECTED

## 2022-03-07 LAB — RPR: RPR Ser Ql: NONREACTIVE

## 2022-03-07 LAB — PSA: PSA: 1.27 ng/mL

## 2022-03-20 ENCOUNTER — Encounter: Payer: Self-pay | Admitting: Family Medicine

## 2022-05-10 ENCOUNTER — Other Ambulatory Visit: Payer: Self-pay | Admitting: Family Medicine

## 2022-05-13 ENCOUNTER — Other Ambulatory Visit: Payer: Self-pay

## 2022-05-13 MED ORDER — GABAPENTIN 100 MG PO CAPS: 100.0000 mg | ORAL_CAPSULE | Freq: Every day | ORAL | 3 refills | Status: DC

## 2022-08-02 ENCOUNTER — Ambulatory Visit (INDEPENDENT_AMBULATORY_CARE_PROVIDER_SITE_OTHER): Payer: Medicare Other

## 2022-08-02 ENCOUNTER — Ambulatory Visit (INDEPENDENT_AMBULATORY_CARE_PROVIDER_SITE_OTHER): Payer: Medicare Other | Admitting: Sports Medicine

## 2022-08-02 DIAGNOSIS — M19041 Primary osteoarthritis, right hand: Secondary | ICD-10-CM | POA: Diagnosis not present

## 2022-08-02 DIAGNOSIS — M25541 Pain in joints of right hand: Secondary | ICD-10-CM

## 2022-08-02 DIAGNOSIS — M25542 Pain in joints of left hand: Secondary | ICD-10-CM | POA: Diagnosis not present

## 2022-08-02 DIAGNOSIS — M19042 Primary osteoarthritis, left hand: Secondary | ICD-10-CM

## 2022-08-02 MED ORDER — CELECOXIB 200 MG PO CAPS: ORAL_CAPSULE | ORAL | 2 refills | Status: DC

## 2022-08-02 MED ORDER — PREDNISONE 50 MG PO TABS: ORAL_TABLET | ORAL | 0 refills | Status: DC

## 2022-08-02 NOTE — Progress Notes (Signed)
    Procedures performed today:    None.  Independent interpretation of notes and tests performed by another provider:   None.  Brief History, Exam, Impression, and Recommendations:    Primary osteoarthritis of both hands This is a very pleasant 72 year old male, he is having increasing pain both hands, wrists distally, left side and right side worst at the third metacarpal phalangeal joint. Minimally improved with ibuprofen. On exam he does have Bouchard, Heberden nodes as well as tenderness third MCP with very mild synovitis. Very mild Dupuytren's contracture as well on the right. Adding steroids, Celebrex, hand physical therapy, x-rays, return to see me in 6 to 8 weeks.    ____________________________________________ Billy Landry. Benjamin Stain, M.D., ABFM., CAQSM., AME. Primary Care and Sports Medicine [facility]  Adjunct Professor of Family Medicine  [place] [employer] [facility] [employer]  Restaurant manager, fast food

## 2022-08-02 NOTE — Assessment & Plan Note (Signed)
This is a very pleasant 72 year old male, he is having increasing pain both hands, wrists distally, left side and right side worst at the third metacarpal phalangeal joint. Minimally improved with ibuprofen. On exam he does have Bouchard, Heberden nodes as well as tenderness third MCP with very mild synovitis. Very mild Dupuytren's contracture as well on the right. Adding steroids, Celebrex, hand physical therapy, x-rays, return to see me in 6 to 8 weeks.

## 2022-08-16 ENCOUNTER — Ambulatory Visit: Payer: Medicare Other | Admitting: Sports Medicine

## 2022-08-17 ENCOUNTER — Encounter: Payer: Self-pay | Admitting: Physical Therapy

## 2022-08-17 ENCOUNTER — Ambulatory Visit: Payer: Medicare Other | Attending: Sports Medicine | Admitting: Physical Therapy

## 2022-08-17 ENCOUNTER — Other Ambulatory Visit: Payer: Self-pay

## 2022-08-17 DIAGNOSIS — M19041 Primary osteoarthritis, right hand: Secondary | ICD-10-CM | POA: Insufficient documentation

## 2022-08-17 DIAGNOSIS — M25642 Stiffness of left hand, not elsewhere classified: Secondary | ICD-10-CM | POA: Insufficient documentation

## 2022-08-17 DIAGNOSIS — M25542 Pain in joints of left hand: Secondary | ICD-10-CM | POA: Insufficient documentation

## 2022-08-17 DIAGNOSIS — M25541 Pain in joints of right hand: Secondary | ICD-10-CM | POA: Diagnosis present

## 2022-08-17 DIAGNOSIS — M25641 Stiffness of right hand, not elsewhere classified: Secondary | ICD-10-CM | POA: Insufficient documentation

## 2022-08-17 DIAGNOSIS — M19042 Primary osteoarthritis, left hand: Secondary | ICD-10-CM | POA: Diagnosis not present
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
## 2022-10-04 ENCOUNTER — Other Ambulatory Visit: Payer: Self-pay | Admitting: Sports Medicine

## 2022-10-04 DIAGNOSIS — M19041 Primary osteoarthritis, right hand: Secondary | ICD-10-CM

## 2022-10-13 ENCOUNTER — Ambulatory Visit (INDEPENDENT_AMBULATORY_CARE_PROVIDER_SITE_OTHER): Payer: Medicare Other | Admitting: Sports Medicine

## 2022-10-13 DIAGNOSIS — M19042 Primary osteoarthritis, left hand: Secondary | ICD-10-CM | POA: Diagnosis not present

## 2022-10-13 DIAGNOSIS — M19041 Primary osteoarthritis, right hand: Secondary | ICD-10-CM | POA: Diagnosis not present

## 2022-10-13 MED ORDER — CELECOXIB 200 MG PO CAPS
ORAL_CAPSULE | ORAL | 3 refills | Status: AC
Start: 2022-10-13 — End: ?

## 2022-10-13 MED ORDER — DICLOFENAC SODIUM 1 % EX GEL
2.0000 g | Freq: Four times a day (QID) | CUTANEOUS | Status: DC
Start: 2022-10-13 — End: 2023-02-09

## 2022-10-13 NOTE — Progress Notes (Signed)
    Procedures performed today:    None.  Independent interpretation of notes and tests performed by another provider:   None.  Brief History, Exam, Impression, and Recommendations:    Primary osteoarthritis of both hands Billy Landry returns, he has bilateral hand osteoarthritis, worst third MCP on the left, second and third MCPs on the right, IP pain as well. We got some x-rays, added some home PT with therapy putty. Added Celebrex, he is doing this twice a day and he feels good, he can get some over-the-counter Voltaren when pain increases, but I do not think he needs any other intervention at this juncture, we can certainly try interphalangeal joint injections in the future if symptoms worsen. Return to see me as needed.    ____________________________________________ Billy Landry. Billy Landry, M.D., ABFM., CAQSM., AME. Primary Care and Sports Medicine [facility]  Adjunct Professor of Family Medicine  [place] [employer] [facility] [employer]  Restaurant manager, fast food

## 2022-10-13 NOTE — Assessment & Plan Note (Signed)
Billy Landry returns, he has bilateral hand osteoarthritis, worst third MCP on the left, second and third MCPs on the right, IP pain as well. We got some x-rays, added some home PT with therapy putty. Added Celebrex, he is doing this twice a day and he feels good, he can get some over-the-counter Voltaren when pain increases, but I do not think he needs any other intervention at this juncture, we can certainly try interphalangeal joint injections in the future if symptoms worsen. Return to see me as needed.

## 2022-12-01 ENCOUNTER — Telehealth: Payer: Self-pay | Admitting: Family Medicine

## 2022-12-01 DIAGNOSIS — B001 Herpesviral vesicular dermatitis: Secondary | ICD-10-CM

## 2022-12-01 NOTE — Telephone Encounter (Signed)
Patient called. Requesting script refill of Valacicvlovir hcl tab 1000mg .

## 2022-12-02 MED ORDER — VALACYCLOVIR HCL 1 G PO TABS
ORAL_TABLET | ORAL | 3 refills | Status: DC
Start: 2022-12-02 — End: 2023-02-09

## 2023-01-04 ENCOUNTER — Ambulatory Visit: Payer: Medicare Other | Admitting: Family Medicine

## 2023-01-04 VITALS — Wt 155.0 lb

## 2023-01-04 DIAGNOSIS — Z0000 Encounter for general adult medical examination without abnormal findings: Secondary | ICD-10-CM
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
## 2023-01-04 NOTE — Patient Instructions (Addendum)
MEDICARE ANNUAL WELLNESS VISIT Health Maintenance Summary and Written Plan of Care  Billy Landry ,  Thank you for allowing me to perform your Medicare Annual Wellness Visit and for your ongoing commitment to your health.   Health Maintenance & Immunization History Health Maintenance  Topic Date Due   COVID-19 Vaccine (1) 01/20/2023 (Originally 07/15/1955)   Zoster Vaccines- Shingrix (1 of 2) 04/06/2023 (Originally 07/14/1969)   INFLUENZA VACCINE  06/26/2023 (Originally 10/27/2022)   Pneumonia Vaccine 46+ Years old (1 of 1 - PCV) 01/04/2024 (Originally 07/15/2014)   Medicare Annual Wellness (AWV)  01/04/2024   DTaP/Tdap/Td (2 - Td or Tdap) 10/25/2025   Hepatitis C Screening  Completed   HPV VACCINES  Aged Out   Colonoscopy  Discontinued   Immunization History  Administered Date(s) Administered   Tdap 10/26/2015    These are the patient goals that we discussed:  Goals Addressed               This Visit's Progress     Patient Stated (pt-stated)        Patient stated that he would like to maintain his weight.         This is a list of Health Maintenance Items that are overdue or due now: There are no preventive care reminders to display for this patient.    Orders/Referrals Placed Today: No orders of the defined types were placed in this encounter.  (Contact our referral department at [phone] if you have not spoken with someone about your referral appointment within the next 5 days)    Follow-up Plan Follow-up with Everrett Coombe, DO as planned Medicare wellness visit in one year. Patient is planning to move and will schedule with his PCP.  Patient will access AVS on my chart.      Health Maintenance, Male Adopting a healthy lifestyle and getting preventive care are important in promoting health and wellness. Ask your health care provider about: The right schedule for you to have regular tests and exams. Things you can do on your own to prevent diseases and  keep yourself healthy. What should I know about diet, weight, and exercise? Eat a healthy diet  Eat a diet that includes plenty of vegetables, fruits, low-fat dairy products, and lean protein. Do not eat a lot of foods that are high in solid fats, added sugars, or sodium. Maintain a healthy weight Body mass index (BMI) is a measurement that can be used to identify possible weight problems. It estimates body fat based on height and weight. Your health care provider can help determine your BMI and help you achieve or maintain a healthy weight. Get regular exercise Get regular exercise. This is one of the most important things you can do for your health. Most adults should: Exercise for at least 150 minutes each week. The exercise should increase your heart rate and make you sweat (moderate-intensity exercise). Do strengthening exercises at least twice a week. This is in addition to the moderate-intensity exercise. Spend less time sitting. Even light physical activity can be beneficial. Watch cholesterol and blood lipids Have your blood tested for lipids and cholesterol at 73 years of age, then have this test every 5 years. You may need to have your cholesterol levels checked more often if: Your lipid or cholesterol levels are high. You are older than 73 years of age. You are at high risk for heart disease. What should I know about cancer screening? Many types of cancers can be detected early and  may often be prevented. Depending on your health history and family history, you may need to have cancer screening at various ages. This may include screening for: Colorectal cancer. Prostate cancer. Skin cancer. Lung cancer. What should I know about heart disease, diabetes, and high blood pressure? Blood pressure and heart disease High blood pressure causes heart disease and increases the risk of stroke. This is more likely to develop in people who have high blood pressure readings or are  overweight. Talk with your health care provider about your target blood pressure readings. Have your blood pressure checked: Every 3-5 years if you are 4-63 years of age. Every year if you are 68 years old or older. If you are between the ages of 65 and 26 and are a current or former smoker, ask your health care provider if you should have a one-time screening for abdominal aortic aneurysm (AAA). Diabetes Have regular diabetes screenings. This checks your fasting blood sugar level. Have the screening done: Once every three years after age 70 if you are at a normal weight and have a low risk for diabetes. More often and at a younger age if you are overweight or have a high risk for diabetes. What should I know about preventing infection? Hepatitis B If you have a higher risk for hepatitis B, you should be screened for this virus. Talk with your health care provider to find out if you are at risk for hepatitis B infection. Hepatitis C Blood testing is recommended for: Everyone born from [birth year] through 1966. Anyone with known risk factors for hepatitis C. Sexually transmitted infections (STIs) You should be screened each year for STIs, including gonorrhea and chlamydia, if: You are sexually active and are younger than 73 years of age. You are older than 73 years of age and your health care provider tells you that you are at risk for this type of infection. Your sexual activity has changed since you were last screened, and you are at increased risk for chlamydia or gonorrhea. Ask your health care provider if you are at risk. Ask your health care provider about whether you are at high risk for HIV. Your health care provider may recommend a prescription medicine to help prevent HIV infection. If you choose to take medicine to prevent HIV, you should first get tested for HIV. You should then be tested every 3 months for as long as you are taking the medicine. Follow these instructions at  home: Alcohol use Do not drink alcohol if your health care provider tells you not to drink. If you drink alcohol: Limit how much you have to 0-2 drinks a day. Know how much alcohol is in your drink. In the [place] one drink equals one 12 oz bottle of beer (355 mL), one 5 oz glass of wine (148 mL), or one 1 oz glass of hard liquor (44 mL). Lifestyle Do not use any products that contain nicotine or tobacco. These products include cigarettes, chewing tobacco, and vaping devices, such as e-cigarettes. If you need help quitting, ask your health care provider. Do not use street drugs. Do not share needles. Ask your health care provider for help if you need support or information about quitting drugs. General instructions Schedule regular health, dental, and eye exams. Stay current with your vaccines. Tell your health care provider if: You often feel depressed. You have ever been abused or do not feel safe at home. Summary Adopting a healthy lifestyle and getting preventive care are important in promoting  health and wellness. Follow your health care provider's instructions about healthy diet, exercising, and getting tested or screened for diseases. Follow your health care provider's instructions on monitoring your cholesterol and blood pressure. This information is not intended to replace advice given to you by your health care provider. Make sure you discuss any questions you have with your health care provider. Document Revised: 08/03/2020 Document Reviewed: 08/03/2020 Elsevier Patient Education  2024 [employer].

## 2023-02-05 ENCOUNTER — Encounter: Payer: Self-pay | Admitting: Family Medicine

## 2023-02-07 ENCOUNTER — Ambulatory Visit: Payer: Medicare Other | Admitting: Family Medicine

## 2023-02-09 ENCOUNTER — Ambulatory Visit (INDEPENDENT_AMBULATORY_CARE_PROVIDER_SITE_OTHER): Payer: Medicare Other | Admitting: Family Medicine

## 2023-02-09 ENCOUNTER — Encounter: Payer: Self-pay | Admitting: Family Medicine

## 2023-02-09 VITALS — BP 171/83 | HR 59 | Ht 70.0 in | Wt 161.0 lb

## 2023-02-09 DIAGNOSIS — G5700 Lesion of sciatic nerve, unspecified lower limb: Secondary | ICD-10-CM | POA: Insufficient documentation

## 2023-02-09 DIAGNOSIS — B001 Herpesviral vesicular dermatitis: Secondary | ICD-10-CM

## 2023-02-09 DIAGNOSIS — R030 Elevated blood-pressure reading, without diagnosis of hypertension: Secondary | ICD-10-CM

## 2023-02-09 MED ORDER — VALACYCLOVIR HCL 1 G PO TABS: ORAL_TABLET | ORAL | 3 refills | Status: DC

## 2023-02-09 MED ORDER — GABAPENTIN 100 MG PO CAPS: 100.0000 mg | ORAL_CAPSULE | Freq: Every day | ORAL | 3 refills | Status: DC

## 2023-02-09 NOTE — Progress Notes (Signed)
Billy Landry - 72 y.o. male MRN [id number]  Date of birth: January 22, 1951  Subjective Chief Complaint  Patient presents with   Back Pain    HPI Billy Landry is a 72 year old male here today for follow-up visit.  He was planning on moving to [place] next month.  He has had some upper buttock and piriformis pain recently.  He does get improvement with stretches of the piriformis.  He has been using some occasional ibuprofen.  He is already taking Celebrex.  He denies numbness or tingling.  Blood pressure is elevated.  He was on hydrochlorothiazide at 1 point.  He had been recording the readings at home which were well-controlled.  He has not checked in a few weeks.  He denies chest pain, shortness of breath, palpitations, headaches or vision changes.  ROS:  A comprehensive ROS was completed and negative except as noted per HPI  Allergies  Allergen Reactions   Bee Venom Anaphylaxis and Swelling    Past Medical History:  Diagnosis Date   Allergy    Arthritis    hands, hips   BPH associated with nocturia    Cataract 3 years ago & current   Left eye done. Right eye needs to be.   DJD of right shoulder    Essential hypertension 01/05/2015   Shingles 12/2017    Past Surgical History:  Procedure Laterality Date   CATARACT EXTRACTION Left 03/29/2011   EYE SURGERY  2 years ago   Detatched left retina - again May 2021   JOINT REPLACEMENT  January 2021   Reverse Replacement Right Shoulder   RETINAL DETACHMENT SURGERY  03/28/2010   REVERSE SHOULDER ARTHROPLASTY Right 04/24/2019   Procedure: RIGHT REVERSE SHOULDER ARTHROPLASTY;  Surgeon: Bjorn Pippin, MD;  Location: [facility];  Service: Orthopedics;  Laterality: Right;   ROTATOR CUFF REPAIR Right 03/28/2008   TONSILECTOMY, ADENOIDECTOMY, BILATERAL MYRINGOTOMY AND TUBES  03/28/1966   VASECTOMY      Social History   Socioeconomic History   Marital status: Single    Spouse name: Not on file   Number of  children: 3   Years of education: 16   Highest education level: Bachelor's degree (e.g., BA, AB, BS)  Occupational History   Occupation: retired    Comment: communications  Tobacco Use   Smoking status: Never   Smokeless tobacco: Never  Vaping Use   Vaping status: Never Used  Substance and Sexual Activity   Alcohol use: Yes    Alcohol/week: 2.0 standard drinks of alcohol    Types: 1 Glasses of wine, 1 Shots of liquor per week    Comment: Random - not every day, not every week... monthly   Drug use: Never   Sexual activity: Yes    Birth control/protection: None  Other Topics Concern   Not on file  Social History Narrative   Lives with his friends. He has three children and four grand children. Did work for [employer]. He enjoys golfing, gardening and feeding birds and squirrels.    Social Determinants of Health   Financial Resource Strain: Low Risk  (02/08/2023)   Overall Financial Resource Strain (CARDIA)    Difficulty of Paying Living Expenses: Not hard at all  Food Insecurity: No Food Insecurity (02/08/2023)   Hunger Vital Sign    Worried About Running Out of Food in the Last Year: Never true    Ran Out of Food in the Last Year: Never true  Transportation Needs: No Transportation Needs (02/08/2023)  PRAPARE - Administrator, Civil Service (Medical): No    Lack of Transportation (Non-Medical): No  Physical Activity: Sufficiently Active (02/08/2023)   Exercise Vital Sign    Days of Exercise per Week: 6 days    Minutes of Exercise per Session: 30 min  Stress: No Stress Concern Present (02/08/2023)   [employer] of Occupational Health - Occupational Stress Questionnaire    Feeling of Stress : Not at all  Social Connections: Moderately Isolated (02/08/2023)   Social Connection and Isolation Panel [NHANES]    Frequency of Communication with Friends and Family: More than three times a week    Frequency of Social Gatherings with Friends and  Family: More than three times a week    Attends Religious Services: 1 to 4 times per year    Active Member of [employer] or Organizations: No    Attends Banker Meetings: Never    Marital Status: Divorced    Family History  Problem Relation Age of Onset   Cancer Mother    Diabetes Father    Stroke Father    Stroke Brother    COPD Sister    Diabetes Brother     Health Maintenance  Topic Date Due   COVID-19 Vaccine (1) Never done   Zoster Vaccines- Shingrix (1 of 2) 04/06/2023 (Originally 07/14/1969)   INFLUENZA VACCINE  06/26/2023 (Originally 10/27/2022)   Pneumonia Vaccine 34+ Years old (1 of 1 - PCV) 01/04/2024 (Originally 07/15/2014)   Medicare Annual Wellness (AWV)  01/04/2024   DTaP/Tdap/Td (2 - Td or Tdap) 10/25/2025   Hepatitis C Screening  Completed   HPV VACCINES  Aged Out   Colonoscopy  Discontinued     ----------------------------------------------------------------------------------------------------------------------------------------------------------------------------------------------------------------- Physical Exam BP (!) 171/83   Pulse (!) 59   Ht 5\' 10"  (1.778 m)   Wt 161 lb (73 kg)   SpO2 99%   BMI 23.10 kg/m   Physical Exam Constitutional:      Appearance: Normal appearance.  Eyes:     General: No scleral icterus. Cardiovascular:     Rate and Rhythm: Normal rate and regular rhythm.  Pulmonary:     Effort: Pulmonary effort is normal.     Breath sounds: Normal breath sounds.  Neurological:     Mental Status: He is alert.  Psychiatric:        Mood and Affect: Mood normal.        Behavior: Behavior normal.     ------------------------------------------------------------------------------------------------------------------------------------------------------------------------------------------------------------------- Assessment and Plan  Piriformis syndrome Recommend continue stretches.  Avoid use of ibuprofen with  Celebrex.  White coat syndrome with high blood pressure but without hypertension Blood pressure elevated in clinic.  Readings at home have been better controlled however has not checked in several weeks.  Encouraged him to continue monitoring at home.   Meds ordered this encounter  Medications   gabapentin (NEURONTIN) 100 MG capsule    Sig: Take 1-3 capsules (100-300 mg total) by mouth at bedtime.    Dispense:  270 capsule    Refill:  3   valACYclovir (VALTREX) 1000 MG tablet    Sig: TAKE ONE-HALF (1/2) TABLET DAILY AS NEEDED    Dispense:  45 tablet    Refill:  3    No follow-ups on file.    This visit occurred during the SARS-CoV-2 public health emergency.  Safety protocols were in place, including screening questions prior to the visit, additional usage of staff PPE, and extensive cleaning of exam room while observing appropriate contact  time as indicated for disinfecting solutions.

## 2023-02-09 NOTE — Assessment & Plan Note (Signed)
Recommend continue stretches.  Avoid use of ibuprofen with Celebrex.

## 2023-02-09 NOTE — Assessment & Plan Note (Signed)
Blood pressure elevated in clinic.  Readings at home have been better controlled however has not checked in several weeks.  Encouraged him to continue monitoring at home.

## 2023-02-21 ENCOUNTER — Encounter (INDEPENDENT_AMBULATORY_CARE_PROVIDER_SITE_OTHER): Payer: Medicare Other | Admitting: Family Medicine

## 2023-02-21 DIAGNOSIS — H938X9 Other specified disorders of ear, unspecified ear: Secondary | ICD-10-CM

## 2023-03-02 ENCOUNTER — Encounter: Payer: Self-pay | Admitting: Family Medicine

## 2023-03-02 MED ORDER — PREDNISONE 20 MG PO TABS: 20.0000 mg | ORAL_TABLET | Freq: Two times a day (BID) | ORAL | 0 refills | Status: AC

## 2023-03-02 NOTE — Telephone Encounter (Signed)
Please see the MyChart message reply(ies) for my assessment and plan.    This patient gave consent for this Medical Advice Message and is aware that it may result in a bill to their insurance company, as well as the possibility of receiving a bill for a co-payment or deductible. They are an established patient, but are not seeking medical advice exclusively about a problem treated during an in person or video visit in the last seven days. I did not recommend an in person or video visit within seven days of my reply.    I spent a total of 7 minutes cumulative time within 7 days through MyChart messaging.  Alijah Hyde, DO   

## 2023-03-06 ENCOUNTER — Encounter: Payer: Medicare Other | Admitting: Family Medicine

## 2023-04-27 ENCOUNTER — Other Ambulatory Visit: Payer: Self-pay

## 2023-04-27 DIAGNOSIS — B001 Herpesviral vesicular dermatitis: Secondary | ICD-10-CM

## 2023-04-27 MED ORDER — GABAPENTIN 100 MG PO CAPS: 100.0000 mg | ORAL_CAPSULE | Freq: Every day | ORAL | 3 refills | Status: AC

## 2023-04-27 MED ORDER — VALACYCLOVIR HCL 1 G PO TABS
ORAL_TABLET | ORAL | 3 refills | Status: AC
Start: 2023-04-27 — End: ?

## 2023-11-28 ENCOUNTER — Encounter: Payer: Self-pay | Admitting: Sports Medicine
